# Patient Record
Sex: Male | Born: 1945 | Race: White | Hispanic: No | Marital: Married | Smoking: Former smoker
Health system: Southern US, Community
[De-identification: ages and names within clinical notes are randomized; demographics above are authoritative.]

## PROBLEM LIST (undated history)

## (undated) DIAGNOSIS — Z923 Personal history of irradiation: Secondary | ICD-10-CM

## (undated) DIAGNOSIS — E785 Hyperlipidemia, unspecified: Secondary | ICD-10-CM

## (undated) DIAGNOSIS — M1990 Unspecified osteoarthritis, unspecified site: Secondary | ICD-10-CM

## (undated) DIAGNOSIS — Z8709 Personal history of other diseases of the respiratory system: Secondary | ICD-10-CM

## (undated) DIAGNOSIS — K219 Gastro-esophageal reflux disease without esophagitis: Secondary | ICD-10-CM

## (undated) DIAGNOSIS — I10 Essential (primary) hypertension: Secondary | ICD-10-CM

## (undated) DIAGNOSIS — K635 Polyp of colon: Secondary | ICD-10-CM

## (undated) DIAGNOSIS — N529 Male erectile dysfunction, unspecified: Secondary | ICD-10-CM

## (undated) DIAGNOSIS — I4891 Unspecified atrial fibrillation: Secondary | ICD-10-CM

## (undated) DIAGNOSIS — I499 Cardiac arrhythmia, unspecified: Secondary | ICD-10-CM

## (undated) DIAGNOSIS — Z9889 Other specified postprocedural states: Secondary | ICD-10-CM

## (undated) HISTORY — DX: Male erectile dysfunction, unspecified: N52.9

## (undated) HISTORY — DX: Hyperlipidemia, unspecified: E78.5

## (undated) HISTORY — DX: Unspecified atrial fibrillation: I48.91

## (undated) HISTORY — DX: Other specified postprocedural states: Z98.890

## (undated) HISTORY — PX: OTHER SURGICAL HISTORY: SHX169

## (undated) HISTORY — PX: ELBOW ARTHROSCOPY: SUR87

---

## 1998-04-26 ENCOUNTER — Ambulatory Visit (HOSPITAL_COMMUNITY): Admission: RE | Admit: 1998-04-26 | Discharge: 1998-04-26 | Payer: Self-pay | Admitting: Internal Medicine

## 2002-01-27 ENCOUNTER — Ambulatory Visit (HOSPITAL_COMMUNITY): Admission: RE | Admit: 2002-01-27 | Discharge: 2002-01-27 | Payer: Self-pay | Admitting: Orthopedic Surgery

## 2002-01-27 ENCOUNTER — Encounter: Payer: Self-pay | Admitting: Orthopedic Surgery

## 2002-03-30 ENCOUNTER — Ambulatory Visit (HOSPITAL_BASED_OUTPATIENT_CLINIC_OR_DEPARTMENT_OTHER): Admission: RE | Admit: 2002-03-30 | Discharge: 2002-03-30 | Payer: Self-pay | Admitting: Orthopedic Surgery

## 2002-06-17 ENCOUNTER — Encounter: Payer: Self-pay | Admitting: Orthopaedic Surgery

## 2002-06-17 ENCOUNTER — Ambulatory Visit (HOSPITAL_COMMUNITY): Admission: RE | Admit: 2002-06-17 | Discharge: 2002-06-17 | Payer: Self-pay | Admitting: Orthopaedic Surgery

## 2004-11-26 ENCOUNTER — Ambulatory Visit: Payer: Self-pay | Admitting: Family Medicine

## 2004-12-02 ENCOUNTER — Ambulatory Visit: Payer: Self-pay | Admitting: Family Medicine

## 2004-12-05 ENCOUNTER — Encounter: Admission: RE | Admit: 2004-12-05 | Discharge: 2004-12-05 | Payer: Self-pay | Admitting: Family Medicine

## 2005-01-08 ENCOUNTER — Ambulatory Visit: Payer: Self-pay | Admitting: Family Medicine

## 2005-12-04 ENCOUNTER — Ambulatory Visit: Payer: Self-pay | Admitting: Family Medicine

## 2005-12-11 ENCOUNTER — Ambulatory Visit: Payer: Self-pay | Admitting: Family Medicine

## 2005-12-18 ENCOUNTER — Encounter: Admission: RE | Admit: 2005-12-18 | Discharge: 2005-12-18 | Payer: Self-pay | Admitting: Family Medicine

## 2006-12-24 ENCOUNTER — Ambulatory Visit: Payer: Self-pay | Admitting: Family Medicine

## 2006-12-24 LAB — CONVERTED CEMR LAB
ALT: 35 units/L (ref 0–40)
AST: 34 units/L (ref 0–37)
Albumin: 4.2 g/dL (ref 3.5–5.2)
Bilirubin, Direct: 0.1 mg/dL (ref 0.0–0.3)
CO2: 33 meq/L — ABNORMAL HIGH (ref 19–32)
Creatinine, Ser: 0.9 mg/dL (ref 0.4–1.5)
Eosinophils Relative: 3.2 % (ref 0.0–5.0)
GFR calc Af Amer: 110 mL/min
HDL: 40.7 mg/dL (ref >39.0)
Hemoglobin: 16.3 g/dL (ref 13.0–17.0)
Monocytes Relative: 9.6 % (ref 3.0–11.0)
Neutro Abs: 2.8 10*3/uL (ref 1.4–7.7)
Neutrophils Relative %: 49.7 % (ref 43.0–77.0)
PSA: 0.63 ng/mL (ref 0.10–4.00)
Platelets: 259 10*3/uL (ref 150–400)
Potassium: 4.3 meq/L (ref 3.5–5.1)
RDW: 12.6 % (ref 11.5–14.6)
Total CHOL/HDL Ratio: 4.8
Total Protein: 6.8 g/dL (ref 6.0–8.3)
Triglycerides: 155 mg/dL — ABNORMAL HIGH (ref 0–149)
VLDL: 31 mg/dL (ref 0–40)
WBC: 5.8 10*3/uL (ref 4.5–10.5)

## 2006-12-31 ENCOUNTER — Ambulatory Visit: Payer: Self-pay | Admitting: Family Medicine

## 2007-01-05 ENCOUNTER — Encounter: Admission: RE | Admit: 2007-01-05 | Discharge: 2007-01-05 | Payer: Self-pay | Admitting: Family Medicine

## 2007-04-13 ENCOUNTER — Ambulatory Visit: Payer: Self-pay | Admitting: Family Medicine

## 2007-04-13 DIAGNOSIS — R002 Palpitations: Secondary | ICD-10-CM | POA: Insufficient documentation

## 2007-04-20 ENCOUNTER — Ambulatory Visit: Payer: Self-pay | Admitting: Internal Medicine

## 2007-04-20 ENCOUNTER — Encounter: Payer: Self-pay | Admitting: Family Medicine

## 2007-04-20 DIAGNOSIS — I480 Paroxysmal atrial fibrillation: Secondary | ICD-10-CM | POA: Insufficient documentation

## 2007-04-20 DIAGNOSIS — I4891 Unspecified atrial fibrillation: Secondary | ICD-10-CM

## 2007-04-28 ENCOUNTER — Encounter: Payer: Self-pay | Admitting: Family Medicine

## 2007-04-28 ENCOUNTER — Ambulatory Visit: Payer: Self-pay | Admitting: Family Medicine

## 2007-04-28 ENCOUNTER — Ambulatory Visit: Payer: Self-pay

## 2007-04-28 DIAGNOSIS — T887XXA Unspecified adverse effect of drug or medicament, initial encounter: Secondary | ICD-10-CM | POA: Insufficient documentation

## 2007-04-28 LAB — CONVERTED CEMR LAB: Digitoxin Lvl: 0.5 ng/mL — ABNORMAL LOW (ref 0.8–2.0)

## 2007-05-03 ENCOUNTER — Ambulatory Visit: Payer: Self-pay | Admitting: Family Medicine

## 2007-05-03 ENCOUNTER — Ambulatory Visit: Payer: Self-pay | Admitting: Internal Medicine

## 2007-05-03 LAB — CONVERTED CEMR LAB
Digitoxin Lvl: 0.6 ng/mL — ABNORMAL LOW (ref 0.8–2.0)
INR: 1.4

## 2007-06-23 ENCOUNTER — Ambulatory Visit: Payer: Self-pay | Admitting: Internal Medicine

## 2007-12-14 ENCOUNTER — Ambulatory Visit: Payer: Self-pay | Admitting: Internal Medicine

## 2007-12-28 ENCOUNTER — Ambulatory Visit: Payer: Self-pay | Admitting: Family Medicine

## 2007-12-28 LAB — CONVERTED CEMR LAB
Basophils Relative: 0.4 % (ref 0.0–1.0)
Bilirubin, Direct: 0.1 mg/dL (ref 0.0–0.3)
CO2: 31 meq/L (ref 19–32)
Calcium: 9 mg/dL (ref 8.4–10.5)
Chloride: 104 meq/L (ref 96–112)
Cholesterol: 183 mg/dL (ref 0–200)
GFR calc non Af Amer: 91 mL/min
Glucose, Bld: 101 mg/dL — ABNORMAL HIGH (ref 70–99)
HDL: 38.7 mg/dL — ABNORMAL LOW (ref >39.0)
Lymphocytes Relative: 33.2 % (ref 12.0–46.0)
Monocytes Absolute: 0.5 10*3/uL (ref 0.1–1.0)
Monocytes Relative: 9.3 % (ref 3.0–12.0)
Platelets: 208 10*3/uL (ref 150–400)
RBC: 5.11 M/uL (ref 4.22–5.81)
RDW: 12.4 % (ref 11.5–14.6)
Sodium: 139 meq/L (ref 135–145)
Total CHOL/HDL Ratio: 4.7
Total Protein: 6.5 g/dL (ref 6.0–8.3)
Triglycerides: 103 mg/dL (ref 0–149)
VLDL: 21 mg/dL (ref 0–40)
WBC: 5.5 10*3/uL (ref 4.5–10.5)

## 2008-01-03 ENCOUNTER — Ambulatory Visit: Payer: Self-pay | Admitting: Family Medicine

## 2008-01-03 DIAGNOSIS — E785 Hyperlipidemia, unspecified: Secondary | ICD-10-CM | POA: Insufficient documentation

## 2008-01-03 DIAGNOSIS — F528 Other sexual dysfunction not due to a substance or known physiological condition: Secondary | ICD-10-CM | POA: Insufficient documentation

## 2008-01-03 DIAGNOSIS — J309 Allergic rhinitis, unspecified: Secondary | ICD-10-CM | POA: Insufficient documentation

## 2008-01-04 ENCOUNTER — Encounter: Admission: RE | Admit: 2008-01-04 | Discharge: 2008-01-04 | Payer: Self-pay | Admitting: Family Medicine

## 2008-01-11 ENCOUNTER — Telehealth: Payer: Self-pay | Admitting: Family Medicine

## 2008-04-11 ENCOUNTER — Telehealth: Payer: Self-pay | Admitting: *Deleted

## 2008-07-20 ENCOUNTER — Ambulatory Visit (HOSPITAL_BASED_OUTPATIENT_CLINIC_OR_DEPARTMENT_OTHER): Admission: RE | Admit: 2008-07-20 | Discharge: 2008-07-20 | Payer: Self-pay | Admitting: Orthopedic Surgery

## 2008-12-04 ENCOUNTER — Ambulatory Visit: Payer: Self-pay | Admitting: Internal Medicine

## 2008-12-04 DIAGNOSIS — I10 Essential (primary) hypertension: Secondary | ICD-10-CM | POA: Insufficient documentation

## 2008-12-18 ENCOUNTER — Encounter: Payer: Self-pay | Admitting: Internal Medicine

## 2008-12-26 ENCOUNTER — Ambulatory Visit: Payer: Self-pay | Admitting: Family Medicine

## 2008-12-26 LAB — CONVERTED CEMR LAB
ALT: 28 units/L (ref 0–53)
AST: 28 units/L (ref 0–37)
Albumin: 4 g/dL (ref 3.5–5.2)
BUN: 11 mg/dL (ref 6–23)
Basophils Absolute: 0 10*3/uL (ref 0.0–0.1)
Bilirubin, Direct: 0.2 mg/dL (ref 0.0–0.3)
Blood in Urine, dipstick: NEGATIVE
CO2: 31 meq/L (ref 19–32)
Calcium: 9.3 mg/dL (ref 8.4–10.5)
Cholesterol: 193 mg/dL (ref 0–200)
Eosinophils Absolute: 0.2 10*3/uL (ref 0.0–0.7)
Eosinophils Relative: 3.5 % (ref 0.0–5.0)
Glucose, Urine, Semiquant: NEGATIVE
HCT: 47.5 % (ref 39.0–52.0)
HDL: 45.5 mg/dL (ref >39.00)
Hemoglobin: 16.4 g/dL (ref 13.0–17.0)
MCHC: 34.6 g/dL (ref 30.0–36.0)
Monocytes Absolute: 0.4 10*3/uL (ref 0.1–1.0)
Neutro Abs: 2.7 10*3/uL (ref 1.4–7.7)
Neutrophils Relative %: 50.4 % (ref 43.0–77.0)
Nitrite: NEGATIVE
PSA: 0.96 ng/mL (ref 0.10–4.00)
RBC: 5.26 M/uL (ref 4.22–5.81)
Sodium: 145 meq/L (ref 135–145)
Total Bilirubin: 1.2 mg/dL (ref 0.3–1.2)
Total CHOL/HDL Ratio: 4
Urobilinogen, UA: 0.2
WBC Urine, dipstick: NEGATIVE
pH: 6.5

## 2009-01-04 ENCOUNTER — Ambulatory Visit: Payer: Self-pay | Admitting: Family Medicine

## 2009-01-04 DIAGNOSIS — Z87448 Personal history of other diseases of urinary system: Secondary | ICD-10-CM | POA: Insufficient documentation

## 2009-01-04 LAB — CONVERTED CEMR LAB
Blood in Urine, dipstick: NEGATIVE
Ketones, urine, test strip: NEGATIVE
Nitrite: NEGATIVE
Specific Gravity, Urine: 1.01
Urobilinogen, UA: 0.2
pH: 6.5

## 2009-04-06 ENCOUNTER — Telehealth: Payer: Self-pay | Admitting: *Deleted

## 2009-04-23 ENCOUNTER — Ambulatory Visit: Payer: Self-pay | Admitting: Family Medicine

## 2009-08-13 ENCOUNTER — Ambulatory Visit: Payer: Self-pay | Admitting: Family Medicine

## 2009-12-14 ENCOUNTER — Encounter: Payer: Self-pay | Admitting: Internal Medicine

## 2009-12-19 ENCOUNTER — Ambulatory Visit: Payer: Self-pay | Admitting: Internal Medicine

## 2010-01-11 ENCOUNTER — Ambulatory Visit: Payer: Self-pay | Admitting: Family Medicine

## 2010-01-11 LAB — CONVERTED CEMR LAB
AST: 30 units/L (ref 0–37)
Albumin: 4.2 g/dL (ref 3.5–5.2)
Bilirubin, Direct: 0.2 mg/dL (ref 0.0–0.3)
CO2: 33 meq/L — ABNORMAL HIGH (ref 19–32)
Calcium: 9.3 mg/dL (ref 8.4–10.5)
Cholesterol: 175 mg/dL (ref 0–200)
Eosinophils Absolute: 0.3 10*3/uL (ref 0.0–0.7)
Glucose, Bld: 88 mg/dL (ref 70–99)
Glucose, Urine, Semiquant: NEGATIVE
LDL Cholesterol: 114 mg/dL — ABNORMAL HIGH (ref 0–99)
Lymphocytes Relative: 28.2 % (ref 12.0–46.0)
Lymphs Abs: 2 10*3/uL (ref 0.7–4.0)
MCV: 93.4 fL (ref 78.0–100.0)
Monocytes Absolute: 0.5 10*3/uL (ref 0.1–1.0)
Neutro Abs: 4.3 10*3/uL (ref 1.4–7.7)
PSA: 0.74 ng/mL (ref 0.10–4.00)
Platelets: 219 10*3/uL (ref 150.0–400.0)
RBC: 5.02 M/uL (ref 4.22–5.81)
RDW: 13.4 % (ref 11.5–14.6)
Specific Gravity, Urine: 1.02
TSH: 1.93 microintl units/mL (ref 0.35–5.50)
Total Bilirubin: 1.3 mg/dL — ABNORMAL HIGH (ref 0.3–1.2)
Total CHOL/HDL Ratio: 4
Triglycerides: 64 mg/dL (ref 0.0–149.0)
VLDL: 12.8 mg/dL (ref 0.0–40.0)
WBC: 7 10*3/uL (ref 4.5–10.5)

## 2010-01-18 ENCOUNTER — Ambulatory Visit: Payer: Self-pay | Admitting: Family Medicine

## 2010-03-07 ENCOUNTER — Ambulatory Visit: Payer: Self-pay | Admitting: Family Medicine

## 2010-03-07 DIAGNOSIS — L919 Hypertrophic disorder of the skin, unspecified: Secondary | ICD-10-CM

## 2010-03-07 DIAGNOSIS — L909 Atrophic disorder of skin, unspecified: Secondary | ICD-10-CM | POA: Insufficient documentation

## 2010-04-22 ENCOUNTER — Ambulatory Visit: Payer: Self-pay | Admitting: Family Medicine

## 2010-07-09 ENCOUNTER — Telehealth: Payer: Self-pay | Admitting: Internal Medicine

## 2010-07-15 ENCOUNTER — Telehealth: Payer: Self-pay | Admitting: Internal Medicine

## 2010-07-25 ENCOUNTER — Telehealth: Payer: Self-pay | Admitting: Family Medicine

## 2010-08-20 ENCOUNTER — Telehealth: Payer: Self-pay | Admitting: Family Medicine

## 2010-09-03 NOTE — Miscellaneous (Signed)
Summary: Waiver of Liability for Zostavax  Waiver of Liability for Zostavax   Imported By: Maryln Gottron 08/16/2009 10:40:49  _____________________________________________________________________  External Attachment:    Type:   Image     Comment:   External Document

## 2010-09-03 NOTE — Assessment & Plan Note (Signed)
Summary: cpx/njr   Vital Signs:  Patient profile:   65 year old male Height:      73 inches Weight:      184 pounds Temp:     98.1 degrees F oral Pulse rate:   80 / minute Pulse rhythm:   regular BP sitting:   120 / 80  (left arm) Cuff size:   regular  Vitals Entered By: Kern Reap CMA Duncan Dull) (January 18, 2010 8:32 AM) CC: cpx   CC:  cpx.  History of Present Illness: Garrett Vargas is a 65 year old, married male, nonsmoker retired Transport planner, who comes in today for general physical examination because of underlying hyperlipidemia and history of atrial fibrillation.  Erectile dysfunction.  His hyperlipidemia, treated with Lipitor 20 mg daily lipids are okay with a total of 175, LDL 114, HDL 48, triglycerides 64.  Atrial fibrillation has resolved for now.  He doesn't think he is been in AF  about a year and a half.  He continues to take Lanoxin, .25 mg daily, atenolol 25 mg daily.  Recent follow-up with Dr. Ladona Ridgel.  Normal a month ago.  He takes V  50 mg p.r.n. for ED  tetanus booster 2006.  These include 2010 Pneumovax 2011.  Allergies (verified): No Known Drug Allergies  Past History:  Past medical, surgical, family and social histories (including risk factors) reviewed, and no changes noted (except as noted below).  Past Medical History: Reviewed history from 01/03/2008 and no changes required. Allergic rhinitis Atrial fibrillation Hyperlipidemia erectile dysfunction lateral meniscus surgery, left knee, 78 ankle surgery, left to 69, bone spurs scope left knee,00/ 03  Past Surgical History: Multiple knee, elbow and foot surgeries  neuroma left foot, 2010, Dr. Dellia Nims, podiatrist  Family History: Reviewed history from 01/03/2008 and no changes required. Family History of CAD Male 1st degree relative <60 Family History of Prostate CA 1st degree relative <50 Family History of Stroke M 1st degree relative <50  Social History: Reviewed history from 01/03/2008 and no  changes required. Retired  Transport planner Married Former Smoker Alcohol use-yes Drug use-no Regular exercise-yes  Review of Systems      See HPI  Physical Exam  General:  Well-developed,well-nourished,in no acute distress; alert,appropriate and cooperative throughout examination Head:  Normocephalic and atraumatic without obvious abnormalities. No apparent alopecia or balding. Eyes:  No corneal or conjunctival inflammation noted. EOMI. Perrla. Funduscopic exam benign, without hemorrhages, exudates or papilledema. Vision grossly normal. Ears:  External ear exam shows no significant lesions or deformities.  Otoscopic examination reveals clear canals, tympanic membranes are intact bilaterally without bulging, retraction, inflammation or discharge. Hearing is grossly normal bilaterally. Nose:  External nasal examination shows no deformity or inflammation. Nasal mucosa are pink and moist without lesions or exudates. Mouth:  Oral mucosa and oropharynx without lesions or exudates.  Teeth in good repair. Neck:  No deformities, masses, or tenderness noted. Chest Wall:  No deformities, masses, tenderness or gynecomastia noted. Breasts:  No masses or gynecomastia noted Lungs:  Normal respiratory effort, chest expands symmetrically. Lungs are clear to auscultation, no crackles or wheezes. Heart:  Normal rate and regular rhythm. S1 and S2 normal without gallop, murmur, click, rub or other extra sounds. Abdomen:  Bowel sounds positive,abdomen soft and non-tender without masses, organomegaly or hernias noted. Rectal:  No external abnormalities noted. Normal sphincter tone. No rectal masses or tenderness. Genitalia:  Testes bilaterally descended without nodularity, tenderness or masses. No scrotal masses or lesions. No penis lesions or urethral discharge. Prostate:  Prostate gland firm and smooth, no enlargement, nodularity, tenderness, mass, asymmetry or induration. Msk:  No deformity or scoliosis noted  of thoracic or lumbar spine.   Pulses:  R and L carotid,radial,femoral,dorsalis pedis and posterior tibial pulses are full and equal bilaterally Extremities:  No clubbing, cyanosis, edema, or deformity noted with normal full range of motion of all joints.   Neurologic:  No cranial nerve deficits noted. Station and gait are normal. Plantar reflexes are down-going bilaterally. DTRs are symmetrical throughout. Sensory, motor and coordinative functions appear intact. Skin:  Intact without suspicious lesions or rashes Cervical Nodes:  No lymphadenopathy noted Axillary Nodes:  No palpable lymphadenopathy Inguinal Nodes:  No significant adenopathy Psych:  Cognition and judgment appear intact. Alert and cooperative with normal attention span and concentration. No apparent delusions, illusions, hallucinations   Impression & Recommendations:  Problem # 1:  HEALTH SCREENING (ICD-V70.0) Assessment Unchanged  Orders: Prescription Created Electronically [phone])  Problem # 2:  ERECTILE DYSFUNCTION, MILD (ICD-302.72) Assessment: Improved  His updated medication list for this problem includes:    Viagra 50 Mg Tabs (Sildenafil citrate) ..... Uad  Problem # 3:  HYPERLIPIDEMIA (ICD-272.4) Assessment: Improved  His updated medication list for this problem includes:    Niaspan 1000 Mg Tbcr (Niacin (antihyperlipidemic)) .Marland Kitchen... Take 1 tablet by mouth at bedtime    Lipitor 20 Mg Tabs (Atorvastatin calcium) .Marland Kitchen... 1 tab @ bedtime  Orders: Prescription Created Electronically [phone])  Problem # 4:  ATRIAL FIBRILLATION (ICD-427.31) Assessment: Improved  His updated medication list for this problem includes:    Lanoxin 0.25 Mg Tabs (Digoxin) .Marland Kitchen... Take 1 tablet by mouth every morning    Adult Aspirin Ec Low Strength 81 Mg Tbec (Aspirin) ..... Once daily    Atenolol 25 Mg Tabs (Atenolol) .Marland Kitchen... Take one half  tablet by mouth daily  Orders: Prescription Created Electronically [phone])  Complete Medication  List: 1)  Niaspan 1000 Mg Tbcr (Niacin (antihyperlipidemic)) .... Take 1 tablet by mouth at bedtime 2)  Lanoxin 0.25 Mg Tabs (Digoxin) .... Take 1 tablet by mouth every morning 3)  Centrum Silver Tabs (Multiple vitamins-minerals) .... Once daily 4)  Adult Aspirin Ec Low Strength 81 Mg Tbec (Aspirin) .... Once daily 5)  Viagra 50 Mg Tabs (Sildenafil citrate) .... Uad 6)  Atenolol 25 Mg Tabs (Atenolol) .... Take one half  tablet by mouth daily 7)  Fish Oil Oil (Fish oil) .... Take one tab two times a day 8)  Lipitor 20 Mg Tabs (Atorvastatin calcium) .Marland Kitchen.. 1 tab @ bedtime  Patient Instructions: 1)  Please schedule a follow-up appointment in 1 year. Prescriptions: LIPITOR 20 MG TABS (ATORVASTATIN CALCIUM) 1 tab @ bedtime  #100 x 3   Entered and Authorized by:   Roderick Pee MD   Signed by:   Roderick Pee MD on 01/18/2010   Method used:   Electronically to        [facility]* (retail)       [address]       [address] Shipping/mailing       [place], [place]  [address]       Ph: [phone]       Fax: [phone]   RxID:   [id number] ATENOLOL 25 MG TABS (ATENOLOL) Take one half  tablet by mouth daily  #100 x 3   Entered and Authorized by:   Roderick Pee MD   Signed by:   Roderick Pee MD on 01/18/2010  Method used:   Electronically to        [facility]* (retail)       [address]       [address] Shipping/mailing       [place], [place]  [address]       Ph: [phone]       Fax: [phone]   RxID:   [id number] VIAGRA 50 MG  TABS (SILDENAFIL CITRATE) UAD  #6 x 11   Entered and Authorized by:   Roderick Pee MD   Signed by:   Roderick Pee MD on 01/18/2010   Method used:   Electronically to        [facility]* (retail)       [address]       [address] Shipping/mailing       [place], [place]  [address]       Ph: [phone]       Fax: [phone]   RxID:   [id number] LANOXIN 0.25  MG  TABS (DIGOXIN) Take 1 tablet by mouth every morning  #100 x 3   Entered and Authorized by:   Roderick Pee MD   Signed by:   Roderick Pee MD on 01/18/2010   Method used:   Electronically to        [facility]* (retail)       [address]       [address] Shipping/mailing       [place], [place]  [address]       Ph: [phone]       Fax: [phone]   RxID:   [id number] NIASPAN 1000 MG TBCR (NIACIN (ANTIHYPERLIPIDEMIC)) Take 1 tablet by mouth at bedtime  #100 x 3   Entered and Authorized by:   Roderick Pee MD   Signed by:   Roderick Pee MD on 01/18/2010   Method used:   Electronically to        [facility]* (retail)       [address]       [address] Shipping/mailing       [place], [place]  [address]       Ph: [phone]       Fax: [phone]   RxID:   [id number]    Immunization History:  Influenza Immunization History:    Influenza:  historical (05/04/2009)

## 2010-09-03 NOTE — Assessment & Plan Note (Signed)
Summary: lesion removal/njrq   Vital Signs:  Patient profile:   65 year old male Height:      73 inches Weight:      180 pounds Temp:     98.0 degrees F oral BP sitting:   102 / 70  (left arm) Cuff size:   regular  Vitals Entered By: Kern Reap CMA Duncan Dull) (March 07, 2010 12:07 PM)  Procedure Note Last Tetanus: Historical (01/08/2005)  Skin Tag Removal: Indication: inflamed lesion Consent signed: yes  Procedure # 1: skin tag(s) removed    Region: anterior    Location: neck/axillary areas    # lesions removed: 15    Instrument used: electrocautery    Anesthesia: 1% lidocaine w/epinephrine  CC: skin tag removal   CC:  skin tag removal.  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  Also, one from his right inner thigh.  Allergies: No Known Drug Allergies   Complete Medication List: 1)  Niaspan 1000 Mg Tbcr (Niacin (antihyperlipidemic)) .... Take 1 tablet by mouth at bedtime 2)  Lanoxin 0.25 Mg Tabs (Digoxin) .... Take 1 tablet by mouth every morning 3)  Centrum Silver Tabs (Multiple vitamins-minerals) .... Once daily 4)  Adult Aspirin Ec Low Strength 81 Mg Tbec (Aspirin) .... Once daily 5)  Viagra 50 Mg Tabs (Sildenafil citrate) .... Uad 6)  Atenolol 25 Mg Tabs (Atenolol) .... Take one half  tablet by mouth daily 7)  Fish Oil Oil (Fish oil) .... Take one tab two times a day 8)  Lipitor 20 Mg Tabs (Atorvastatin calcium) .Marland Kitchen.. 1 tab @ bedtime  Other Orders: Removal of Skin Tags up to 15 Lesions (11200)

## 2010-09-03 NOTE — Assessment & Plan Note (Signed)
Summary: tb skin test - rv   Nurse Visit   Allergies: No Known Drug Allergies  Immunizations Administered:  PPD Skin Test:    Vaccine Type: PPD    Site: left forearm    Mfr: Sanofi Pasteur    Dose: 0.1 ml    Route: ID    Given by: Kern Reap CMA (AAMA)    Exp. Date: 06/06/2011    Lot #: [id number]  PPD Results    Date of reading: 04/25/2010    Results: < 5mm    Interpretation: negative  Orders Added: 1)  TB Skin Test [86580] 2)  Admin 1st Vaccine [08657]

## 2010-09-03 NOTE — Assessment & Plan Note (Signed)
Summary: shingles vax//ccm   Nurse Visit   Allergies: No Known Drug Allergies  Immunizations Administered:  Zostavax # 1:    Vaccine Type: Zostavax    Site: left deltoid    Mfr: Merck    Dose: 0.5 ml    Route: [place]    Given by: Kern Reap CMA (AAMA)    Exp. Date: 08/22/2010    Lot #: [id number]    Physician counseled: yes  Orders Added: 1)  Zoster (Shingles) Vaccine Live [90736] 2)  Admin 1st Vaccine [phone]

## 2010-09-03 NOTE — Assessment & Plan Note (Signed)
Summary: F1Y/ GD  Medications Added ATENOLOL 25 MG TABS (ATENOLOL) Take one half  tablet by mouth daily      Allergies Added: NKDA  History of Present Illness: [name] returns today for followup.  He is a middle aged man with HTN and atrial fibrillation.  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  He has been fairly well controlled in the past on digoxin and as needed beta blockers.  He has had no symptomatic atrial fibrillation in the interim.  He does record his heart rate and blood pressure on a regular basis and these have been well controlled.  He denies palpitations.   No c/p or sob.   Current Medications (verified): 1)  Niaspan 1000 Mg Tbcr (Niacin (Antihyperlipidemic)) .... Take 1 Tablet By Mouth At Bedtime 2)  Lanoxin 0.25 Mg  Tabs (Digoxin) .... Take 1 Tablet By Mouth Every Morning 3)  Centrum Silver   Tabs (Multiple Vitamins-Minerals) .... Once Daily 4)  Adult Aspirin Ec Low Strength 81 Mg  Tbec (Aspirin) .... Once Daily 5)  Viagra 50 Mg  Tabs (Sildenafil Citrate) .... Uad 6)  Atenolol 25 Mg Tabs (Atenolol) .... Take One Half  Tablet By Mouth Daily 7)  Fish Oil  Oil (Fish Oil) .... Take One Tab Two Times A Day 8)  Lipitor 20 Mg Tabs (Atorvastatin Calcium) .Marland Kitchen.. 1 Tab @ Bedtime  Allergies (verified): No Known Drug Allergies  Past History:  Past Medical History: Last updated: 01/03/2008 Allergic rhinitis Atrial fibrillation Hyperlipidemia erectile dysfunction lateral meniscus surgery, left knee, 78 ankle surgery, left to 69, bone spurs scope left knee,00/ 03  Past Surgical History: Last updated: 09/01/2008 Multiple knee, elbow and foot surgeries  Review of Systems  The patient denies chest pain, syncope, dyspnea on exertion, and peripheral edema.    Vital Signs:  Patient profile:   65 year old male Height:      73 inches Weight:      184 pounds BMI:     24.36 Pulse rate:   64 / minute BP sitting:   110 / 80  (left  arm)  Vitals Entered By: Laurance Flatten CMA (Dec 19, 2009 10:16 AM)  Physical Exam  General:  Well-developed,well-nourished,in no acute distress; alert,appropriate and cooperative throughout examination Head:  Normocephalic and atraumatic without obvious abnormalities. No apparent alopecia or balding. Mouth:  Oral mucosa and oropharynx without lesions or exudates.  Teeth in good repair. Neck:  No deformities, masses, or tenderness noted. Lungs:  Normal respiratory effort, chest expands symmetrically. Lungs are clear to auscultation, no crackles or wheezes. Heart:  Normal rate and regular rhythm. S1 and S2 normal without gallop, murmur, click, rub or other extra sounds. Abdomen:  Bowel sounds positive,abdomen soft and non-tender without masses, organomegaly or hernias noted. Msk:  No deformity or scoliosis noted of thoracic or lumbar spine.   Pulses:  R and L carotid,radial,femoral,dorsalis pedis and posterior tibial pulses are full and equal bilaterally Extremities:  No clubbing, cyanosis, edema, or deformity noted with normal full range of motion of all joints.   Neurologic:  No cranial nerve deficits noted. Station and gait are normal. Plantar reflexes are down-going bilaterally. DTRs are symmetrical throughout. Sensory, motor and coordinative functions appear intact.   Impression & Recommendations:  Problem # 1:  ATRIAL FIBRILLATION (ICD-427.31) He remains well controlled and in NSR without anti-arrhythmic drugs.  Continue current meds. His updated medication list for this problem includes:    Lanoxin 0.25 Mg Tabs (  Digoxin) .Marland Kitchen... Take 1 tablet by mouth every morning    Adult Aspirin Ec Low Strength 81 Mg Tbec (Aspirin) ..... Once daily    Atenolol 25 Mg Tabs (Atenolol) .Marland Kitchen... Take one half  tablet by mouth daily  Problem # 2:  ESSENTIAL HYPERTENSION, BENIGN (ICD-401.1) His blood pressure has been fairly well controlled.  He will maintain a low sodium diet, exercise regularly, and continue  his current meds. His updated medication list for this problem includes:    Adult Aspirin Ec Low Strength 81 Mg Tbec (Aspirin) ..... Once daily    Atenolol 25 Mg Tabs (Atenolol) .Marland Kitchen... Take one half  tablet by mouth daily  Patient Instructions: 1)  Your physician recommends that you schedule a follow-up appointment in: 12 months with Dr. Ladona Ridgel.

## 2010-09-03 NOTE — Progress Notes (Signed)
Summary: Pt request call   Phone Note Call from Patient Call back at Work Phone [phone]   Caller: Patient Reason for Call: Talk to Nurse Initial call taken by: Judie Grieve,  July 09, 2010 3:07 PM  Follow-up for Phone Call        spoke with Dr Orvan Falconer  will have Dr Ladona Ridgel call him back after reviewing his letter Dennis Bast, RN, BSN  July 09, 2010 3:25 PM

## 2010-09-03 NOTE — Letter (Signed)
Summary: Patients Self-Recorded Med List  Patients Self-Recorded Med List   Imported By: Debby Freiberg 01/25/2010 10:19:45  _____________________________________________________________________  External Attachment:    Type:   Image     Comment:   External Document

## 2010-09-05 NOTE — Progress Notes (Signed)
  Phone Note Call from Patient   Caller: Patient Call For: Roderick Pee MD Summary of Call: Pt will be using Mail order 08/2010, and will be needing all new prescriptions...Marland KitchenMarland KitchenLipitor 20mg . , Atenolol 25 mg., Digoxin 0.25 mg. [phone] Initial call taken by: Lynann Beaver CMA AAMA,  July 25, 2010 3:36 PM  Follow-up for Phone Call        ok Follow-up by: Roderick Pee MD,  July 25, 2010 3:47 PM

## 2010-09-05 NOTE — Progress Notes (Signed)
  Phone Note Refill Request Message from:  Patient on July 25, 2010 4:04 PM  Refills Requested: Medication #1:  LIPITOR 20 MG TABS 1 tab @ bedtime.  Medication #2:  ATENOLOL 25 MG TABS Take one half  tablet by mouth daily  Medication #3:  LANOXIN 0.25 MG  TABS Take 1 tablet by mouth every morning Initial call taken by: Kern Reap CMA Duncan Dull),  July 25, 2010 4:04 PM    Prescriptions: LIPITOR 20 MG TABS (ATORVASTATIN CALCIUM) 1 tab @ bedtime  #100 x 3   Entered by:   Kern Reap CMA (AAMA)   Authorized by:   Roderick Pee MD   Signed by:   Kern Reap CMA (AAMA) on 07/25/2010   Method used:   Print then Give to Patient   RxID:   [id number] ATENOLOL 25 MG TABS (ATENOLOL) Take one half  tablet by mouth daily  #100 x 3   Entered by:   Kern Reap CMA (AAMA)   Authorized by:   Roderick Pee MD   Signed by:   Kern Reap CMA (AAMA) on 07/25/2010   Method used:   Print then Give to Patient   RxID:   [id number] LANOXIN 0.25 MG  TABS (DIGOXIN) Take 1 tablet by mouth every morning  #100 x 3   Entered by:   Kern Reap CMA (AAMA)   Authorized by:   Roderick Pee MD   Signed by:   Kern Reap CMA (AAMA) on 07/25/2010   Method used:   Print then Give to Patient   RxID:   [id number]

## 2010-09-05 NOTE — Progress Notes (Signed)
Summary: refill  Phone Note Call from Patient   Summary of Call: patient needs a refill of niaspan Initial call taken by: Kern Reap CMA Duncan Dull),  August 20, 2010 1:45 PM    Prescriptions: NIASPAN 1000 MG TBCR (NIACIN (ANTIHYPERLIPIDEMIC)) Take 1 tablet by mouth at bedtime  #100 x 3   Entered by:   Kern Reap CMA (AAMA)   Authorized by:   Roderick Pee MD   Signed by:   Kern Reap CMA (AAMA) on 08/20/2010   Method used:   Print then Give to Patient   RxID:   [id number]

## 2010-09-05 NOTE — Progress Notes (Signed)
Summary: calling back   Phone Note Call from Patient Call back at Home Phone [phone] Call back at Work Phone [phone]   Caller: Patient Reason for Call: Talk to Nurse, Talk to Doctor Details for Reason: per pt calling back to speak with dr. Ladona Ridgel. Initial call taken by: Lorne Skeens,  July 15, 2010 3:20 PM  Follow-up for Phone Call        pt would like to speak with dr taylor. pt states he sent a letter to dr Ladona Ridgel. Follow-up by: Roe Coombs,  July 23, 2010 8:32 AM  Additional Follow-up for Phone Call Additional follow up Details #1::        I spoke to patient and instructed him that it would be ok to stop digoxin. Additional Follow-up by: Laren Boom, MD, [facility],  August 05, 2010 9:46 PM

## 2010-12-06 ENCOUNTER — Encounter: Payer: Self-pay | Admitting: Family Medicine

## 2010-12-10 ENCOUNTER — Encounter: Payer: Self-pay | Admitting: Family Medicine

## 2010-12-10 ENCOUNTER — Ambulatory Visit (INDEPENDENT_AMBULATORY_CARE_PROVIDER_SITE_OTHER)
Admission: RE | Admit: 2010-12-10 | Discharge: 2010-12-10 | Disposition: A | Discharge status: Home / Self Care | Payer: Medicare Other | Source: Ambulatory Visit | Attending: Family Medicine | Admitting: Family Medicine

## 2010-12-10 ENCOUNTER — Ambulatory Visit (INDEPENDENT_AMBULATORY_CARE_PROVIDER_SITE_OTHER): Payer: Medicare Other | Admitting: Family Medicine

## 2010-12-10 DIAGNOSIS — E785 Hyperlipidemia, unspecified: Secondary | ICD-10-CM

## 2010-12-10 DIAGNOSIS — I10 Essential (primary) hypertension: Secondary | ICD-10-CM

## 2010-12-10 DIAGNOSIS — F528 Other sexual dysfunction not due to a substance or known physiological condition: Secondary | ICD-10-CM

## 2010-12-10 DIAGNOSIS — Z23 Encounter for immunization: Secondary | ICD-10-CM

## 2010-12-10 DIAGNOSIS — I4891 Unspecified atrial fibrillation: Secondary | ICD-10-CM

## 2010-12-10 DIAGNOSIS — Z87448 Personal history of other diseases of urinary system: Secondary | ICD-10-CM

## 2010-12-10 LAB — HEPATIC FUNCTION PANEL
ALT: 23 U/L (ref 0–53)
Alkaline Phosphatase: 50 U/L (ref 39–117)
Bilirubin, Direct: 0.2 mg/dL (ref 0.0–0.3)
Total Bilirubin: 1.1 mg/dL (ref 0.3–1.2)
Total Protein: 6.6 g/dL (ref 6.0–8.3)

## 2010-12-10 LAB — POCT URINALYSIS DIPSTICK
Bilirubin, UA: NEGATIVE
Blood, UA: NEGATIVE
Urobilinogen, UA: 0.2

## 2010-12-10 LAB — CBC WITH DIFFERENTIAL/PLATELET
Basophils Relative: 0.3 % (ref 0.0–3.0)
Eosinophils Absolute: 0.2 10*3/uL (ref 0.0–0.7)
Eosinophils Relative: 3.6 % (ref 0.0–5.0)
HCT: 47.3 % (ref 39.0–52.0)
Lymphs Abs: 2 10*3/uL (ref 0.7–4.0)
Monocytes Relative: 8.1 % (ref 3.0–12.0)
Neutrophils Relative %: 53.8 % (ref 43.0–77.0)
Platelets: 199 10*3/uL (ref 150.0–400.0)

## 2010-12-10 LAB — BASIC METABOLIC PANEL
BUN: 14 mg/dL (ref 6–23)
CO2: 31 mEq/L (ref 19–32)
Chloride: 103 mEq/L (ref 96–112)
Glucose, Bld: 94 mg/dL (ref 70–99)

## 2010-12-10 LAB — LIPID PANEL
LDL Cholesterol: 104 mg/dL — ABNORMAL HIGH (ref 0–99)
Total CHOL/HDL Ratio: 3
Triglycerides: 55 mg/dL (ref 0.0–149.0)

## 2010-12-10 MED ORDER — ATORVASTATIN CALCIUM 20 MG PO TABS: 20.0000 mg | ORAL_TABLET | Freq: Every day | ORAL | Status: DC

## 2010-12-10 MED ORDER — SILDENAFIL CITRATE 50 MG PO TABS: 50.0000 mg | ORAL_TABLET | ORAL | Status: DC

## 2010-12-10 MED ORDER — ATENOLOL 25 MG PO TABS: 25.0000 mg | ORAL_TABLET | Freq: Every day | ORAL | Status: DC

## 2010-12-10 MED ORDER — NIACIN ER (ANTIHYPERLIPIDEMIC) 1000 MG PO TBCR: 1000.0000 mg | EXTENDED_RELEASE_TABLET | Freq: Every day | ORAL | Status: DC

## 2010-12-10 NOTE — Progress Notes (Signed)
Subjective:    Patient ID: Garrett Vargas, male    DOB: May 21, 1946, 65 y.o.   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  Also, a history of atrial fibrillation that does resolve spontaneously over time.  He takes Tenormin 12.5 mg daily and a baby aspirin.  BP 110/76.  He takes Lipitor 20 nightly for hyperlipidemia, along with Niaspan 1000 mg.  Will check lipid panel today.  He uses fiber 50 mg p.r.n. For ED.  He had a history two years ago.  They said it resolved spontaneously.  He was on Lanoxin, .5 mg daily, however, in consultation with Dr. Ladona Ridgel.  They stopped the Lanoxin.  He continues to be in normal sinus rhythm.  He is due to see Dr. Ladona Ridgel.  This summer.  He gets routine eye care, dental care, colonoscopy 10 years ago, normal, tetanus, 2006, shingles 2011, Pneumovax today.  Is having more trouble with his left knee.  He's been to see an orthopedist will eventually need a total knee replacement, but for now.  He can play golf and do normal activities with minimal discomfort.  As noted is a boat.  He gets routine eye care.  Vision.  Normal, hearing normal, activities of daily living.  He exercises on a regular basis.  Weight 184 pounds.  He does have guns in the house.  However, they are  locked up.  He is an avid Therapist, nutritional.  He does have a living will and health-care power-of-attorney   Review of Systems  Constitutional: Negative.   HENT: Negative.   Eyes: Negative.   Respiratory: Negative.   Cardiovascular: Negative.   Gastrointestinal: Negative.   Genitourinary: Negative.   Musculoskeletal: Negative.   Skin: Negative.   Neurological: Negative.   Hematological: Negative.   Psychiatric/Behavioral: Negative.        Objective:   Physical Exam  Constitutional: He is oriented to person,  place, and time. He appears well-developed and well-nourished.  HENT:  Head: Normocephalic and atraumatic.  Right Ear: External ear normal.  Left Ear: External ear normal.  Nose: Nose normal.  Mouth/Throat: Oropharynx is clear and moist.  Eyes: Conjunctivae and EOM are normal. Pupils are equal, round, and reactive to light.  Neck: Normal range of motion. Neck supple. No JVD present. No tracheal deviation present. No thyromegaly present.  Cardiovascular: Normal rate, regular rhythm, normal heart sounds and intact distal pulses.  Exam reveals no gallop and no friction rub.   No murmur heard. Pulmonary/Chest: Effort normal and breath sounds normal. No stridor. No respiratory distress. He has no wheezes. He has no rales. He exhibits no tenderness.  Abdominal: Soft. Bowel sounds are normal. He exhibits no distension and no mass. There is no tenderness. There is no rebound and no guarding.  Genitourinary: Rectum normal, prostate normal and penis normal. Guaiac negative stool. No penile tenderness.  Musculoskeletal: Normal range of motion. He exhibits no edema and no tenderness.  Lymphadenopathy:    He has no cervical adenopathy.  Neurological: He is alert and oriented to person, place, and time. He has normal reflexes. No cranial nerve deficit. He exhibits normal muscle tone.  Skin: Skin is warm and dry. No rash noted. No erythema. No pallor.  Psychiatric: He has a normal mood and affect. His behavior is normal. Judgment and thought content normal.  Assessment & Plan:  Healthy male.  History of mild hypertension.  Continue Tenormin 12.5 mg daily.  Hyperlipidemia.  Continue Lipitor 20 mg daily and a baby aspirin also Niaspan.  Erectile dysfunction continue Viagra p.r.n.  History of atrial fibrillation asymptomatic.  No atrial fibrillation, x 2 years follow-up with Dr. Ladona Ridgel.  This summer.  Patient requesting chest x-ray.  Will also give him a Pneumovax.

## 2010-12-10 NOTE — Progress Notes (Signed)
Addended by: Kern Reap on: 12/10/2010 10:58 AM   Modules accepted: Orders

## 2010-12-10 NOTE — Patient Instructions (Signed)
Continue your current good health habits.  Follow-up in one year or sooner if any problems

## 2010-12-11 LAB — PSA: PSA: 1 ng/mL (ref 0.10–4.00)

## 2010-12-11 LAB — TESTOSTERONE: Testosterone: 503.02 ng/dL (ref 350.00–890.00)

## 2010-12-17 NOTE — Letter (Signed)
May 03, 2007    Tinnie Gens A. [name], M.D.  [address] [place], [place] [address]   RE:  Garrett Vargas, Garrett Vargas  MRN:  [id number]  /  DOB:  1945/09/29   Dear [name]:   Thank you for referring Garrett Vargas for EP evaluation. 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 He is referred today for additional evaluation. Please  note that my copy of my clinic note will be forthcoming. Overall, his  examination was normal and reviewed his echocardiogram which  demonstrated low normal LV function and normal atrial dimension. There  are no valvular problems noted as well. As noted before, his examination  is normal. His EKG today was also normal.   After discussion of treatment options with the patient, I think  continuing digoxin is a very reasonable approach, though he may  ultimately require calcium blocker with beta blocker or even a sodium-  channel blocker if his symptoms worsen. Because of his paroxysmal nature  of  his atrial fibrillation, but more importantly because his [place] score was  0, I think we could safely not require him to take Coumadin and let him  aspirin therapy alone. I plan to see the patient back in the office in  approximately two months. Once again, thanks for referring Dr. Orvan Falconer  for EP evaluation.    Sincerely,      Doylene Canning. Ladona Ridgel, MD  Electronically Signed    [name]  DD: 05/03/2007  DT: 05/04/2007  Job #: [id number]

## 2010-12-17 NOTE — Assessment & Plan Note (Signed)
[facility]                         ELECTROPHYSIOLOGY OFFICE NOTE   NAME:Garrett Vargas, Garrett Vargas                      MRN:          [id number]  DATE:06/23/2007                            DOB:          Jan 01, 1946    Garrett Vargas returns today for followup.  He is a very pleasant middle-  aged male with paroxysmal atrial fibrillation and dyslipidemia, who was  initially referred several weeks ago for evaluation of palpitations and  documented atrial fibrillation, with a rapid ventricular response.  When  I saw him, he had been placed on digoxin, with some improvement in his  symptoms.  I initially had recommended a period of watchful waiting and  additional followup to see how it is that he has done.  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  It was my impression at the time I saw him back  in September 2008 that aspirin therapy alone in conjunction with digoxin  was most reasonable.  He has continued to check his blood pressure and  has noted a slight increase in his blood pressure over the last several  weeks, with systolics in the 130-140 range.  Despite this, he has had  very minimal episodes of palpitations.  The last episode where his heart  raced was May 17, 2007.  The patient returns today for additional  evaluation, noting overall he feels well.   PHYSICAL EXAMINATION:  GENERAL:  He is a pleasant well-appearing middle-  aged man, in no distress.  VITAL SIGNS:  Blood pressure was 143/84, the pulse was 82 and regular,  the respirations were 18, the weight was 191 pounds.  NECK:  Revealed no jugular venous distention.  LUNGS:  Clear bilaterally to auscultation.  No wheezes, rales, or  rhonchi were present.  There was no increased work of breathing.  CARDIOVASCULAR:  Revealed a regular rate and rhythm, with a normal S1  and S2.  The PMI was not enlarged or laterally displaced.  EXTREMITIES:  Demonstrated no edema.  The pulses were 2+ and symmetric.   EKG demonstrates sinus rhythm.   IMPRESSION:  1. Paroxysmal atrial fibrillation.  2. Borderline hypertension.   DISCUSSION:  Overall, Garrett Vargas is stable, and I have recommended  that he continue on low-dose aspirin therapy.  I have given him a  prescription today for low-dose atenolol, initially 12.5 and then 25 mg  daily, taken in conjunction with his digoxin.  It may well be that we  have to increase his dose further, but I would like to see him back in  several months before making this  decision.  If his palpitations increase in frequency and severity, then  I would like to see him back sooner.     Doylene Canning. Ladona Ridgel, MD  Electronically Signed    [name]  DD: 06/23/2007  DT: 06/24/2007  Job #: [id number]   cc:   Garrett Gens A. [name], MD

## 2010-12-17 NOTE — Assessment & Plan Note (Signed)
[facility]                         ELECTROPHYSIOLOGY OFFICE NOTE   NAME:Garrett Vargas, Koelling                      MRN:          [id number]  DATE:05/03/2007                            DOB:          03-Nov-1945    Dr. Orvan Falconer is referred today by Eugenio Hoes. [name], MD, for evaluation of  atrial fibrillation.  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  He was ready for a treadmill test several days ago and on being hooked  up to the monitor was found to be in atrial fibrillation with a rapid  ventricular response at approximately 150 beats per minute.  Despite  this, he was fairly asymptomatic.  The patient is referred today for  additional evaluation.  He has had no syncope.  He denies chest pain.  He denies shortness of breath.  He has undergone an echocardiogram,  which demonstrates low normal LV function, and his valves were otherwise  normal.  His atrial chambers were normal.   ADDITIONAL PAST MEDICAL HISTORY:  1. Multiple arthritic problems.  2. Prior knee and elbow surgeries.  3. He has a history of dyslipidemia.  He is on a statin and Lipitor.  4. The patient was recently started on Coumadin.   FAMILY HISTORY:  Negative for premature coronary disease.   SOCIAL HISTORY:  The patient is retired.  He denies tobacco or ethanol  abuse.   REVIEW OF SYSTEMS:  Negative except as noted in the HPI.  Otherwise, all  systems were reviewed and found to be negative.   PHYSICAL EXAM:  He is a pleasant, well-appearing middle-aged man in no  acute distress.  The blood pressure today was 102/74, the pulse was 79 and regular, the  respirations were 16, the weight was 188 pounds.  HEENT:  Normocephalic and atraumatic.  Pupils equal and round.  The  oropharynx was moist.  Sclerae were anicteric.  He did wear glasses.  NECK:  No jugular venous distention.  There was no thyromegaly.   The  trachea was midline.  The carotids were 2+ and symmetric.  LUNGS:  Clear bilaterally to auscultation.  No wheezes, rales or rhonchi  were present.  There was no increased work of breathing.  CARDIOVASCULAR:  A regular rate and rhythm, normal S1 and S2.  I did not  appreciate any murmurs, rubs or gallops.  The PMI was not enlarged nor  laterally displaced.  ABDOMEN:  Soft, nontender, nondistended.  There was no organomegaly.  Bowel sounds were present.  There was no rebound or guarding.  EXTREMITIES:  No clubbing, cyanosis, or edema.  The pulses were 2+ and  symmetric.  NEUROLOGIC:  Alert and oriented x3 with cranial nerves intact.  The  strength was 5/5 and symmetric.   The EKG today demonstrates sinus rhythm with normal axis and intervals.  EKG from previously demonstrates atrial fibrillation.   IMPRESSION:  1. Paroxysmal atrial fibrillation (lone atrial fibrillation).  2. History of multiple arthritic problems.  3. Dyslipidemia, on statin therapy.   DISCUSSION:  Overall, Dr. Orvan Falconer  is stable.  I have recommended that  he continue on his digoxin.  If his symptoms are not improved, however,  then he may well need to be switched to a calcium channel blocker or a  beta blocker.  Overall, though, the patient is very minimally  symptomatic.  Because he has a CHADS score of 0, then there is no  indication for Coumadin at this time and I have recommended that he take  aspirin 81 mg daily.  I will also plan to see him back in approximately  2 months.     Doylene Canning. Ladona Ridgel, MD  Electronically Signed    [name]  DD: 05/03/2007  DT: 05/04/2007  Job #: [id number]   cc:   Tinnie Gens A. [name], MD

## 2010-12-17 NOTE — Assessment & Plan Note (Signed)
[facility]                         ELECTROPHYSIOLOGY OFFICE NOTE   Man, Effertz SOVEREIGN RAMIRO                      MRN:          [id number]  DATE:12/14/2007                            DOB:          May 14, 1946    HISTORY:  Mr. Tavano returns today for followup.  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  He returns today for followup.  Today, he brings in his heart rate and blood pressure which he records  very regularly.  He has been well over the last several months.  He  notes that he has palpitations perhaps once a week lasting just a few  minutes that he knows of.  His blood pressure has been fairly well  controlled.  He has started exercising again to help bring down his  blood pressure.   MEDICATIONS:  1. Lipitor 10 a day.  2. Diazepam 1 gm a day.  3. Centrum Silver.  4. Fish oil 1 gm a day.  5. Digoxin 0.25 daily.  6. Aspirin 81 mg daily.   PHYSICAL EXAMINATION:  GENERAL:  He is a pleasant, well-appearing middle-  aged man in no distress.  VITAL SIGNS:  Blood pressure is 137/83, the pulse 74 and regular,  respirations were 18.  Weight was 186 pounds.  NECK:  Revealed no jugular venous distention.  LUNGS:  Clear bilaterally to auscultation.  There are no wheezes, rales  or rhonchi.  CARDIAC:  Revealed a regular rate and rhythm with normal S1-S2.  EXTREMITIES:  Demonstrated no edema.   DIAGNOSTICS:  The EKG demonstrates sinus rhythm with an incomplete right  bundle branch block.   IMPRESSION:  1. Paroxysmal atrial fibrillation.  2. Hypertension well controlled with diet therapy.   DISCUSSION:  Mr. Fiumara is stable today.  His AFib is well controlled.  I have recommended that he have p.r.n. beta blockers available and I  have given a prescription for atenolol 25 a day to be used on as needed  basis if he goes  out of rhythm or his blood pressure is up.   FOLLOW UP:  I plan to see the patient back in the office in 6 months.     Doylene Canning. Ladona Ridgel, MD  Electronically Signed    [name]  DD: 12/14/2007  DT: 12/14/2007  Job #: [id number]   cc:   Tinnie Gens A. [name], MD

## 2010-12-17 NOTE — Op Note (Signed)
NAMEJATORIAN, Garrett Vargas NO.:  [id number]   MEDICAL RECORD NO.:  [id number]          PATIENT TYPE:  AMB   LOCATION:  DSC                          FACILITY:  [facility]   PHYSICIAN:  Loreta Ave, M.D. DATE OF BIRTH:  01-25-1946   DATE OF PROCEDURE:  07/20/2008  DATE OF DISCHARGE:                               OPERATIVE REPORT   PREOPERATIVE DIAGNOSES:  1. Left shoulder impingement, rotator cuff tear.  2. Distal clavicle osteolysis.   POSTOPERATIVE DIAGNOSES:  1. Left shoulder impingement, rotator cuff tear.  2. Distal clavicle osteolysis.  3. Anterior labrum tear.   PROCEDURES:  1. Left shoulder exam under anesthesia.  2. Arthroscopy.  3. Debridement of rotator cuff and labrum.  4. Bursectomy.  5. Acromioplasty.  6. Coracoacromial ligament release.  7. Excision of distal clavicle.  8. Arthroscopic-assisted rotator cuff repair with FiberWire suture x2,      swivel lock anchors x2.   SURGEON:  Loreta Ave, MD   ASSISTANT:  Genene Churn. Barry Dienes, [place], present throughout the entire case  necessary for timely completion of procedure.   ANESTHESIA:  General.   BLOOD LOSS:  Minimal.   SPECIMENS:  None.   CULTURES:  None.   COMPLICATIONS:  None.   DRESSINGS:  Soft compressive with shoulder immobilizer.   PROCEDURE:  The patient was brought to the operating room, placed on the  operating table in supine position.  After adequate anesthesia has been  obtained, shoulder examined.  Full motion and good stability.  Placed in  a beach-chair position on the shoulder positioner, prepped and draped in  the usual sterile fashion.  Three portals; anterior, posterior, and  lateral.  Shoulder entered with blunt obturator.  Arthroscope was  introduced, shoulder distended and inspected.  Complex tearing of  anterior labrum debrided.  Articular cartilage, capsular ligamentous  structure, biceps tendon, biceps anchor intact.  Undersurface of cuff  examined.  Complete  tearing of supraspinatus anterior half with near  complete tearing of posterior.  A lot of anterior tendinous tearing.  Debride back to healthy tissue.  Very mobile and repairable.  Cannula  redirected subacromially.  Type 2 acromion.  Bursa resected and cuff  assessed.  Tuberosity roughened abrading bone.  Acromioplasty to a type  1 acromion, shaver and high-speed bur releasing undersurface of CA  ligament and cautery.  Distal clavicle of grade 4 changes, more  spurring.  Periarticular spurs lateral, a centimeter of clavicle  resected.  Adequacy of decompression, clavicle excision confirmed  viewing from all portals.  Lateral portal opened further for cannula  placement.  Cuff was then captured with two horizontal mattress  FiberWire sutures brought out anteriorly and then laterally.  Firmly  anchored down into the tuberosity and the side bleeding bone with two  swivel lock anchors.  Nice firm watertight closure then repair, the cuff  confirmed.  Full passive motion.  No impingement.  Instruments and fluid  removed.  Portals were all closed with nylon.  Injected with Marcaine.  Sterile compressive dressing applied.  Shoulder immobilizer applied.  Anesthesia reversed.  Brought to recovery room.  Tolerated the surgery  well.  No complications.      Loreta Ave, M.D.  Electronically Signed     DFM/MEDQ  D:  07/20/2008  T:  07/21/2008  Job:  [id number]

## 2010-12-20 NOTE — Op Note (Signed)
NAME:  KELSON, QUEENAN NO.:  [id number]   MEDICAL RECORD NO.:  [id number]                   PATIENT TYPE:  AMB   LOCATION:  DSC                                  FACILITY:  [facility]   PHYSICIAN:  Loreta Ave, M.D.              DATE OF BIRTH:  01-29-1946   DATE OF PROCEDURE:  03/30/2002  DATE OF DISCHARGE:  03/30/2002                                 OPERATIVE REPORT   PREOPERATIVE DIAGNOSES:  1. Left knee anterior cruciate ligament tear with anterolateral rotary     instability.  2. Degenerative arthritis.   POSTOPERATIVE DIAGNOSES:  1. Left knee anterior cruciate ligament with anterolateral rotary     instability.  2. Grade 4 degenerative change, lateral compartment, bone-on-bone.  3. Degenerative tearing, remnants of lateral meniscus.  4. Extensive degenerative tearing, posterior half of medial meniscus.  5. Grade 2 and 3 changes, medial compartment and patellofemoral joint.   DESCRIPTION OF PROCEDURES:  1. Left knee examination under anesthesia, arthroscopy, partial medial and     lateral meniscectomies.  2. Generalized debridement and chondroplasty.  3. Anterior cruciate ligament reconstruction not performed because of extent     of degenerative changes present.   SURGEON:  Loreta Ave, M.D.   ASSISTANT:  Arlys Maaz D. Petrarca, P.A.-C.   ANESTHESIA:  General.   ESTIMATED BLOOD LOSS:  Minimal.   CULTURES:  None.   COMPLICATIONS:  None.   DRESSING:  Soft compressive.   DESCRIPTION OF PROCEDURE:  Patient brought to the operating room and placed  on the operating table in the supine position.  After adequate anesthesia  had been obtained, left knee examined.  Positive Lachman, positive drawer,  positive pivot shift.  Collaterals stable.  Marked grating and crepitus,  lateral compartment.  Tourniquet applied, prepped and draped in the usual  sterile fashion.  Exsanguinated with elevation and Esmarch, tourniquet  inflated to 300  mmHg.  Three portals created, one superolateral, each medial  and lateral parapatellar.  Inflow catheter introduced, knee distended,  arthroscope introduced, knee inspected.  Grade 3 changes of patellofemoral  joint treated with chondroplasty throughout.  Reactive synovitis debrided.  Chondral loose bodies removed.  ACL deficient, which was the stump on the  tibia.  PCL intact.  Lateral compartment grade 4 throughout bone-on-bone, no  cartilage left over almost the entirety of the compartment.  Remnants of  lateral meniscus circumferentially debrided back out to the rim.  Most of  these have been removed from previous open meniscectomy.  Medial compartment  only had grade 2 changes but extensive degenerative tearing, posterior half  of medial meniscus.  Saucerized out and tapered in smoothly, removing the  posterior half.  The entire knee thoroughly assessed from all aspects and  all portals.  The degree of degenerative changes laterally was so severe  that cruciate ligament reconstruction was not indicated.  Instruments and  fluid removed.  Portals  and the knee injected with Marcaine.  Portals closed  with 4-0 nylon.  A sterile compressive dressing applied.  Anesthesia  reversed.  Brought to the recovery room.  Tolerated the surgery well, no  complications.                                               Loreta Ave, M.D.    DFM/MEDQ  D:  03/30/2002  T:  04/03/2002  Job:  [id number]

## 2010-12-24 ENCOUNTER — Encounter: Payer: Self-pay | Admitting: Internal Medicine

## 2010-12-24 ENCOUNTER — Ambulatory Visit (INDEPENDENT_AMBULATORY_CARE_PROVIDER_SITE_OTHER): Payer: Medicare Other | Admitting: Internal Medicine

## 2010-12-24 VITALS — BP 120/80 | HR 81 | Resp 16 | Ht 74.0 in | Wt 185.0 lb

## 2010-12-24 DIAGNOSIS — I4891 Unspecified atrial fibrillation: Secondary | ICD-10-CM

## 2010-12-24 DIAGNOSIS — I10 Essential (primary) hypertension: Secondary | ICD-10-CM

## 2010-12-24 NOTE — Assessment & Plan Note (Signed)
His blood pressure appears to be well-controlled. He will continue his low-dose beta blocker. He will maintain a low-sodium diet.

## 2010-12-24 NOTE — Progress Notes (Signed)
HPI Dr. 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 His symptoms have been very minimal. He also has borderline hypertension which has been well-controlled. Interestingly, his sinus rate is somewhat higher than would be expected particularly for his level of exertion and fitness. He has not had any syncope. He denies chest pain or shortness of breath or peripheral edema. No Known Allergies   Current Outpatient Prescriptions  Medication Sig Dispense Refill  . aspirin 81 MG EC tablet Take 81 mg by mouth daily.        Marland Kitchen atenolol (TENORMIN) 25 MG tablet Take 1 tablet (25 mg total) by mouth daily. Take one half tablet by mouth daily  100 tablet  2  . atorvastatin (LIPITOR) 20 MG tablet Take 1 tablet (20 mg total) by mouth daily. 1 tab@@ bedtime   100 tablet  3  . Fish Oil OIL by Does not apply route.        . Multiple Vitamins-Minerals (CENTRUM SILVER) tablet Take 1 tablet by mouth daily.        . niacin (NIASPAN) 1000 MG CR tablet Take 1 tablet (1,000 mg total) by mouth at bedtime.  100 tablet  3  . sildenafil (VIAGRA) 50 MG tablet Take 1 tablet (50 mg total) by mouth as directed.  10 tablet  11     Past Medical History  Diagnosis Date  . ALLERGIC RHINITIS   . A-fib   . Hyperlipemia   . Erectile dysfunction   . S/P lateral meniscectomy of left knee     ROS:   All systems reviewed and negative except as noted in the HPI.   Past Surgical History  Procedure Date  . Multiple knee,elbow and foot surgeries   . Neuroma left foot,2010, dr. Vida Roller      Family History  Problem Relation Age of Onset  . Coronary artery disease    . Prostate cancer    . Stroke       History   Social History  . Marital Status: Married    Spouse Name: N/A    Number of Children: N/A  . Years of Education: N/A   Occupational History  . retired Transport planner   . former smoker   . alocohol use-yes    . Drug use-no   . regular exercise-yes    Social History Main Topics  . Smoking status: Former Smoker    Quit date: 08/04/1978  . Smokeless tobacco: Not on file  . Alcohol Use: Not on file  . Drug Use: Not on file  . Sexually Active: Not on file   Other Topics Concern  . Not on file   Social History Narrative  . No narrative on file     BP 120/80  Pulse 81  Resp 16  Ht 6\' 2"  (1.88 m)  Wt 185 lb (83.915 kg)  BMI 23.75 kg/m2  Physical Exam:  Well appearing NAD HEENT: Unremarkable Neck:  No JVD, no thyromegally Lymphatics:  No adenopathy Back:  No CVA tenderness Lungs:  Clear. No wheezes, rales, or rhonchi. HEART:  Regular rate rhythm, no murmurs, no rubs, no clicks Abd:  Flat, positive bowel sounds, no organomegally, no rebound, no guarding Ext:  2 plus pulses, no edema, no cyanosis, no clubbing Skin:  No rashes no nodules Neuro:  CN II through XII intact, motor grossly intact  EKG Normal sinus rhythm, 82 beats per minute  Assess/Plan:

## 2010-12-24 NOTE — Patient Instructions (Signed)
Your physician recommends that you schedule a follow-up appointment as needed  

## 2010-12-24 NOTE — Assessment & Plan Note (Signed)
He has had no symptomatic atrial ablation. He will continue his current medications and maintain a period of watchful waiting.

## 2010-12-26 NOTE — Progress Notes (Signed)
Addended by: Marrion Coy on: 12/26/2010 10:24 AM   Modules accepted: Orders

## 2011-04-15 ENCOUNTER — Telehealth: Payer: Self-pay | Admitting: Family Medicine

## 2011-04-15 NOTE — Telephone Encounter (Signed)
Opened in error

## 2011-04-16 ENCOUNTER — Ambulatory Visit (INDEPENDENT_AMBULATORY_CARE_PROVIDER_SITE_OTHER): Payer: Medicare Other | Admitting: Family Medicine

## 2011-04-16 DIAGNOSIS — Z111 Encounter for screening for respiratory tuberculosis: Secondary | ICD-10-CM

## 2011-04-16 DIAGNOSIS — Z0000 Encounter for general adult medical examination without abnormal findings: Secondary | ICD-10-CM

## 2011-04-18 LAB — TB SKIN TEST
Induration: 0
TB Skin Test: NEGATIVE mm

## 2011-05-09 LAB — BASIC METABOLIC PANEL
CO2: 30 mEq/L (ref 19–32)
Chloride: 103 mEq/L (ref 96–112)
Creatinine, Ser: 0.79 mg/dL (ref 0.4–1.5)
Glucose, Bld: 91 mg/dL (ref 70–99)

## 2011-05-27 ENCOUNTER — Encounter (HOSPITAL_COMMUNITY): Payer: Medicare Other

## 2011-05-27 ENCOUNTER — Other Ambulatory Visit: Payer: Self-pay | Admitting: Orthopedic Surgery

## 2011-05-27 LAB — BASIC METABOLIC PANEL
CO2: 30 mEq/L (ref 19–32)
GFR calc non Af Amer: 87 mL/min — ABNORMAL LOW (ref >90)
Glucose, Bld: 129 mg/dL — ABNORMAL HIGH (ref 70–99)
Potassium: 3.2 mEq/L — ABNORMAL LOW (ref 3.5–5.1)
Sodium: 140 mEq/L (ref 135–145)

## 2011-05-27 LAB — URINALYSIS, ROUTINE W REFLEX MICROSCOPIC
Glucose, UA: 250 mg/dL — AB
Hgb urine dipstick: NEGATIVE
Protein, ur: NEGATIVE mg/dL

## 2011-05-27 LAB — DIFFERENTIAL
Lymphocytes Relative: 25 % (ref 12–46)
Monocytes Absolute: 0.7 10*3/uL (ref 0.1–1.0)
Monocytes Relative: 8 % (ref 3–12)
Neutro Abs: 5.6 10*3/uL (ref 1.7–7.7)

## 2011-05-27 LAB — SURGICAL PCR SCREEN
MRSA, PCR: NEGATIVE
Staphylococcus aureus: NEGATIVE

## 2011-05-27 LAB — CBC
HCT: 46.3 % (ref 39.0–52.0)
Hemoglobin: 15.7 g/dL (ref 13.0–17.0)
MCH: 31.7 pg (ref 26.0–34.0)
MCHC: 33.9 g/dL (ref 30.0–36.0)

## 2011-05-27 LAB — PROTIME-INR: Prothrombin Time: 13.4 seconds (ref 11.6–15.2)

## 2011-06-02 ENCOUNTER — Inpatient Hospital Stay (HOSPITAL_COMMUNITY)
Admission: RE | Admit: 2011-06-02 | Discharge: 2011-06-04 | DRG: 470 | Disposition: A | Discharge status: Home Health | Payer: Medicare Other | Source: Ambulatory Visit | Attending: Orthopedic Surgery | Admitting: Orthopedic Surgery

## 2011-06-02 DIAGNOSIS — I4891 Unspecified atrial fibrillation: Secondary | ICD-10-CM | POA: Diagnosis present

## 2011-06-02 DIAGNOSIS — I10 Essential (primary) hypertension: Secondary | ICD-10-CM | POA: Diagnosis present

## 2011-06-02 DIAGNOSIS — IMO0002 Reserved for concepts with insufficient information to code with codable children: Secondary | ICD-10-CM

## 2011-06-02 DIAGNOSIS — Z01812 Encounter for preprocedural laboratory examination: Secondary | ICD-10-CM

## 2011-06-02 DIAGNOSIS — M1710 Unilateral primary osteoarthritis, unspecified knee: Principal | ICD-10-CM | POA: Diagnosis present

## 2011-06-02 DIAGNOSIS — E7800 Pure hypercholesterolemia, unspecified: Secondary | ICD-10-CM | POA: Diagnosis present

## 2011-06-02 DIAGNOSIS — Z79899 Other long term (current) drug therapy: Secondary | ICD-10-CM

## 2011-06-02 DIAGNOSIS — Y9239 Other specified sports and athletic area as the place of occurrence of the external cause: Secondary | ICD-10-CM

## 2011-06-02 DIAGNOSIS — Z7982 Long term (current) use of aspirin: Secondary | ICD-10-CM

## 2011-06-02 LAB — ABO/RH: ABO/RH(D): O POS

## 2011-06-02 LAB — TYPE AND SCREEN: ABO/RH(D): O POS

## 2011-06-03 LAB — BASIC METABOLIC PANEL
BUN: 8 mg/dL (ref 6–23)
Calcium: 8.2 mg/dL — ABNORMAL LOW (ref 8.4–10.5)
Creatinine, Ser: 0.69 mg/dL (ref 0.50–1.35)
GFR calc Af Amer: >90 mL/min (ref >90)
GFR calc non Af Amer: >90 mL/min (ref >90)
Potassium: 3.9 mEq/L (ref 3.5–5.1)

## 2011-06-03 LAB — CBC
HCT: 35.8 % — ABNORMAL LOW (ref 39.0–52.0)
MCH: 30.5 pg (ref 26.0–34.0)
MCHC: 33 g/dL (ref 30.0–36.0)
RDW: 13.1 % (ref 11.5–15.5)

## 2011-06-04 LAB — BASIC METABOLIC PANEL
Calcium: 8.9 mg/dL (ref 8.4–10.5)
GFR calc Af Amer: >90 mL/min (ref >90)
GFR calc non Af Amer: >90 mL/min (ref >90)
Potassium: 3.8 mEq/L (ref 3.5–5.1)
Sodium: 132 mEq/L — ABNORMAL LOW (ref 135–145)

## 2011-06-04 LAB — CBC
MCH: 31.3 pg (ref 26.0–34.0)
MCHC: 33.9 g/dL (ref 30.0–36.0)
Platelets: 185 10*3/uL (ref 150–400)
RDW: 13.2 % (ref 11.5–15.5)

## 2011-06-06 NOTE — Op Note (Signed)
[name], Vargas NO.:  [id number]  MEDICAL RECORD NO.:  [id number]  LOCATION:  [id number]                         FACILITY:  [facility]  PHYSICIAN:  Ollen Gross, M.D.    DATE OF BIRTH:  09-09-1945  DATE OF PROCEDURE:  06/02/2011 DATE OF DISCHARGE:                              OPERATIVE REPORT   PREOPERATIVE DIAGNOSIS:  Osteoarthritis, left knee.  POSTOPERATIVE DIAGNOSIS:  Osteoarthritis, left knee.  PROCEDURE:  Left total knee arthroplasty.  SURGEON:  Ollen Gross, M.D.  ASSISTANT:  Garrett Vargas, P.A.C.  ANESTHESIA:  Spinal.  ESTIMATED BLOOD LOSS:  Minimal.  DRAINS:  Hemovac x1.  TOURNIQUET TIME:  39 minutes at 300 mmHg.  COMPLICATIONS:  None.  CONDITION:  Stable to recovery.  BRIEF CLINICAL NOTE:  Garrett Vargas is a 65 year old male with severe advanced end-stage arthritis of the left knee with progressively worsening pain and dysfunction.  He has bone-on-bone changes tricompartmental in nature.  He has had long-term nonop management including injections, analgesics, and regular exercises.  Despite this, he has had increasing pain and dysfunction.  He has pain at rest.  This was pain with activity.  He has developed limited movement of the knee due to the arthritis.  He even has difficulties with ADL sometimes.  He presents now for left total knee arthroplasty.  PROCEDURE IN DETAIL:  After successful administration of spinal anesthetic, a tourniquet was placed high on his left thigh, and his left lower extremity was prepped and draped in the usual sterile fashion. Extremity was wrapped in Esmarch, knee flexed, tourniquet inflated to 300 mmHg.  Midline incision was made with a 10 blade through subcutaneous tissue to the level of the extensor mechanism.  A fresh blade was used to make a medial parapatellar arthrotomy.  Soft tissue over the proximal medial tibia subperiosteally elevated to the joint line with the knife and into the  semimembranosus bursa with a Cobb elevator.  Soft tissue laterally was elevated with attention being paid to avoid the patellar tendon on tibial tubercle.  The patella was everted, knee flexed 90 degrees, and ACL and PCL removed.  Drill was used create a starting hole in the distal femur and the canal was thoroughly irrigated.  The 5 degree left valgus alignment guide was placed.  The distal femoral cutting block was pinned to remove 11 mm off the distal femur.  Resection was made with an oscillating saw.  The tibia was subluxed forward and menisci removed.  Extramedullary tibial alignment guide was placed referencing proximally at the medial aspect of the tibial tubercle and distally along the second metatarsal axis and tibial crest.  Block was pinned to remove 2 mm off the more deficient lateral side.  Tibial resection was made with an oscillating saw.  Size 5 was most appropriate tibial component.  The proximal tibia was prepared to modular drill and keel punch for the size 5.  Femoral sizing guide was placed, and size 5 was most appropriate on the femur.  Rotation was marked at the epicondylar axis and confirmed by creating rectangular flexion gap at 90 degrees.  The block was pinned in that position and the anterior, posterior, and chamfer cuts made.  Intercondylar block was placed and that cut was made.  Trial size 5 posterior stabilized femur was placed.  A 12.5 mm posterior stabilized rotating platform insert trial was placed.  With the 12.5, full extension was achieved with excellent varus-valgus and anterior- posterior balance, throughout full range of motion.  The patella was everted, thickness measured to be 28 mm.  Freehand resection was taken to 16 mm, 41 template was placed, lug holes were drilled, trial patella was placed and it tracks normally.  Osteophytes were removed off the posterior femur with the trial in place.  All trials were removed and the cut bone surfaces  were prepared with pulsatile lavage.  Cement was mixed and once ready for implantation, the size 5 mobile bearing tibial tray, size 5 posterior stabilized femur, and 41 patella were cemented into place.  The patella was held with a clamp.  Trial 12.5 insert was placed, knee held in full extension, all extruded cement removed.  Once cement fully hardened, then the permanent 12.5 mm posterior stabilized rotating platform insert was placed in tibial tray.  Wound was copiously irrigated with saline solution and the arthrotomy closed over Hemovac drain with interrupted #1 PDS.  Flexion against gravity was about 135 degrees and patella tracks normally.  Tourniquet was then released for a total time of 39 minutes.  The flexion against gravity was 135 degrees. Patella tracks normally.  Subcu was closed with interrupted 2-0 Vicryl and subcuticular running 4-0 Monocryl.  Catheter for Marcaine pain pump was placed and pumps initiated.  Incision was cleaned and dried and Steri-Strips and a bulky sterile dressing were applied.  He was then placed into a knee immobilizer, awakened, and transported to recovery in stable condition.      Please note that a surgical assistant was a medical necessity for the safe and expeditious performance of this procedure. Assistant was necessary for retraction of the ligaments and vital neurovascular structures and for proper positioning of the  leg to allow for anatomic placement of the prosthesis.    Ollen Gross, M.D.     FA/MEDQ  D:  06/02/2011  T:  06/02/2011  Job:  [id number]  Electronically Signed by Ollen Gross M.D. on 06/06/2011 09:48:33 AM

## 2011-06-06 NOTE — H&P (Signed)
Garrett Vargas, COLLEGE NO.:  [id number]  MEDICAL RECORD NO.:  [id number]  LOCATION:                                 FACILITY:  PHYSICIAN:  Garrett Vargas, M.D.    DATE OF BIRTH:  1946/07/05  DATE OF ADMISSION:  06/02/2011 DATE OF DISCHARGE:                             HISTORY & PHYSICAL   CHIEF COMPLAINT:  Left knee pain.  HISTORY OF PRESENT ILLNESS:  The patient is a 65 year old male who has been seen by Dr. Lequita Halt and followed for ongoing left knee pain.  He is a retired Transport planner.  He has had problems with his knees for many years now.  He had original injury dating back in high school playing ball.  He tore his ACL at that time, and did not have surgery.  He later had lateral meniscectomy in the 70s.  He has also had 2 knee arthroscopies since then.  Over the past several years, his knee has continued to get worse.  He has had injections including viscous supplementation, but despite conservative measures including injections, he continues to have pain.  He has been seen in the office where x-rays of both knees show the left knee only has bone-on-bone changes of the patella.  Femoral and the lateral compartments with significant narrowing medially.  He is at a point now where he already has end-stage arthritis and failed conservative measures.  It is felt he would benefit from undergoing surgical intervention.  Risks and benefits have been discussed.  He elected to proceed with surgery.  ALLERGIES:  No known drug allergies.  CURRENT MEDICATIONS:  Lipitor, atenolol, Niaspan, omega-3 fish oil, Centrum Silver and baby aspirin.  PAST MEDICAL HISTORY: 1. Hypertension. 2. Hypercholesterolemia. 3. History of paroxysmal atrial fibrillation. 4. Childhood measles.  PAST SURGICAL HISTORY:  He has had 3 surgeries on the left knee, left elbow surgery in the 90s x2, and also right elbow surgery.  FAMILY HISTORY:  Father deceased at age 51 of prostate  disease.  Mother deceased at age 30 of heart disease.  SOCIAL HISTORY:  Married, retired Transport planner.  Past smoker, 1-2 drinks of alcohol a day.  His wife will be assisting the care after surgery. He does have a living will healthcare power of attorney.  REVIEW OF SYSTEMS:  GENERAL:  No fevers, chills or night sweats. NEUROLOGIC:  No seizure, syncope or paralysis.  RESPIRATORY:  No shortness of breath, productive cough or hemoptysis.  CARDIOVASCULAR: No chest pain.  No orthopnea.  GI:  No nausea, vomiting, diarrhea or constipation.  GU:  No dysuria, hematuria or discharge. MUSCULOSKELETAL:  Knee pain.  PHYSICAL EXAMINATION:  VITAL SIGNS:  Pulse 78, respirations 12, blood pressure 112/82. GENERAL:  This is a 65 year old white male, well nourished, well developed, tall frame, alert, oriented, cooperative, excellent historian. HEENT:  Normocephalic and atraumatic.  Pupils are round and reactive. EOMs intact. NECK:  Supple.  No bruits. CHEST:  Clear. HEART:  Regular rate and rhythm without murmur.  S1 and S2 noted. ABDOMEN:  Soft, nontender.  Bowel sounds present. RECTAL:  Not done, not pertinent to present illness. BREASTS:  Not done, not pertinent to present illness.  GENITALIA:  Not done, not pertinent to present illness. EXTREMITIES:  Left knee, no effusion.  Range of motion 5-120.  Marked crepitus.  Tender lateral than medially.  IMPRESSION:  Osteoarthritis, left knee.  PLAN:  The patient was admitted to [facility] to undergo left total knee replacement arthroplasty.  Surgery will be performed by Dr. Ollen Vargas.     Alexzandrew L. Julien Girt, P.A.C.   ______________________________ Garrett Vargas, M.D.    ALP/MEDQ  D:  06/01/2011  T:  06/02/2011  Job:  [id number]  cc:   Tinnie Gens A. [name], MD [address] [place] [address]  Doylene Canning. Ladona Ridgel, MD [address] [place] [address]

## 2011-06-17 NOTE — Discharge Summary (Signed)
NAMECHESKY, HEYER NO.:  [id number]  MEDICAL RECORD NO.:  [id number]  LOCATION:  [id number]                         FACILITY:  [facility]  PHYSICIAN:  Ollen Gross, M.D.    DATE OF BIRTH:  Nov 19, 1945  DATE OF ADMISSION:  06/02/2011 DATE OF DISCHARGE:  06/04/2011                              DISCHARGE SUMMARY   ADMITTING DIAGNOSES: 1. Osteoarthritis, left knee. 2. Hypertension. 3. Hypercholesterolemia. 4. History of paroxysmal atrial fibrillation. 5. Childhood measles.  DISCHARGE DIAGNOSIS: 1. Osteoarthritis, left knee status post left total knee replacement     arthroplasty. 2. Postoperative hyponatremia. 3. Hypertension. 4. Hypercholesterolemia. 5. History of paroxysmal atrial fibrillation. 6. Childhood measles.  PROCEDURE:  June 02, 2011, left total knee.  SURGEON:  Ollen Gross, M.D.  ASSISTANT:  Alexzandrew L. Perkins, P.A.C.  ANESTHESIA:  Spinal.  TOURNIQUET TIME:  39 minutes.  CONSULTS:  None.  BRIEF HISTORY:  The patient is a 65 year old male with severe end-stage arthritis of the left knee, progressive worsening pain, dysfunction, failed nonoperative management.  He has bone-on-bone, but limited motion and increasing dysfunction, felt to be a good candidate, subsequently admitted to the hospital.  LABORATORY DATA:  Admission CBC not scanned in the chart, but the postoperative hemoglobin was down to 11.8, back up to 12 with hematocrit of 35.4.  Chemistry panel on admission not scanned in the chart.  Sodium was 133 on postop day #1, dropped down to 132 on postop day #2. Remaining electrolytes remained within normal limits.  Blood group type O positive.  HOSPITAL COURSE:  The patient admitted to [facility], taken to OR, underwent above-stated procedure without complication.  The patient tolerated the procedure well, later transferred to recovery room from orthopedic floor, started on p.o. and IV analgesic pain control following  surgery, given 24 hours postoperative IV antibiotics, started on DVT prophylaxis.  Actually, doing very well on the morning of day #1, had excellent urinary output.  Sodium was a little low at 133, hemoglobin looked good.  Started back on home meds, actually got up and walked very well with physical therapy on that afternoon and by day #2, was doing very well.  Pain was under excellent control and the patient wanted to go home, so we got 2 good sessions in the therapy and once he met all of his goals, we let him to go home later that day.  The dressing was changed on day 2.  Also, the incision was healing well.  DISCHARGE PLAN: 1. The patient discharged home on June 04, 2011. 2. Discharge diagnoses:  Please see above. 3. Discharge meds:  OxyIR, Robaxin, Xarelto.  Continue atenolol,     Lipitor, Niaspan. 4. Diet:  Heart healthy diet. 5. Activity:  Weightbearing as tolerated.  Total knee protocol.     Follow up 2 weeks.  DISPOSITION AND CONDITION ON DISCHARGE:  Improving.     Alexzandrew L. Julien Girt, P.A.C.   ______________________________ Ollen Gross, M.D.    ALP/MEDQ  D:  06/17/2011  T:  06/17/2011  Job:  [id number]  cc:   Tinnie Gens A. [name], MD [address] [place] [address]  Doylene Canning. Ladona Ridgel, MD [address] [place] [address]

## 2011-07-14 ENCOUNTER — Other Ambulatory Visit: Payer: Self-pay | Admitting: *Deleted

## 2011-07-14 DIAGNOSIS — E785 Hyperlipidemia, unspecified: Secondary | ICD-10-CM

## 2011-07-14 MED ORDER — ATORVASTATIN CALCIUM 20 MG PO TABS: 20.0000 mg | ORAL_TABLET | Freq: Every day | ORAL | Status: DC

## 2011-08-07 DIAGNOSIS — M1710 Unilateral primary osteoarthritis, unspecified knee: Secondary | ICD-10-CM | POA: Diagnosis not present

## 2011-08-07 DIAGNOSIS — IMO0002 Reserved for concepts with insufficient information to code with codable children: Secondary | ICD-10-CM | POA: Diagnosis not present

## 2011-08-13 DIAGNOSIS — M8448XA Pathological fracture, other site, initial encounter for fracture: Secondary | ICD-10-CM | POA: Diagnosis not present

## 2011-09-10 DIAGNOSIS — M8448XA Pathological fracture, other site, initial encounter for fracture: Secondary | ICD-10-CM | POA: Diagnosis not present

## 2011-10-10 DIAGNOSIS — L821 Other seborrheic keratosis: Secondary | ICD-10-CM | POA: Diagnosis not present

## 2011-10-10 DIAGNOSIS — D239 Other benign neoplasm of skin, unspecified: Secondary | ICD-10-CM | POA: Diagnosis not present

## 2011-12-15 ENCOUNTER — Ambulatory Visit: Payer: Medicare Other | Admitting: Family Medicine

## 2011-12-24 ENCOUNTER — Encounter: Payer: Self-pay | Admitting: Family Medicine

## 2011-12-24 ENCOUNTER — Ambulatory Visit (INDEPENDENT_AMBULATORY_CARE_PROVIDER_SITE_OTHER): Payer: Medicare Other | Admitting: Family Medicine

## 2011-12-24 VITALS — BP 102/72 | Temp 97.8°F | Wt 188.0 lb

## 2011-12-24 DIAGNOSIS — N401 Enlarged prostate with lower urinary tract symptoms: Secondary | ICD-10-CM

## 2011-12-24 DIAGNOSIS — F528 Other sexual dysfunction not due to a substance or known physiological condition: Secondary | ICD-10-CM

## 2011-12-24 DIAGNOSIS — Z0000 Encounter for general adult medical examination without abnormal findings: Secondary | ICD-10-CM

## 2011-12-24 DIAGNOSIS — E785 Hyperlipidemia, unspecified: Secondary | ICD-10-CM

## 2011-12-24 DIAGNOSIS — I4891 Unspecified atrial fibrillation: Secondary | ICD-10-CM

## 2011-12-24 DIAGNOSIS — Z87448 Personal history of other diseases of urinary system: Secondary | ICD-10-CM

## 2011-12-24 DIAGNOSIS — I10 Essential (primary) hypertension: Secondary | ICD-10-CM

## 2011-12-24 DIAGNOSIS — N138 Other obstructive and reflux uropathy: Secondary | ICD-10-CM

## 2011-12-24 LAB — CBC WITH DIFFERENTIAL/PLATELET
Basophils Absolute: 0 10*3/uL (ref 0.0–0.1)
Eosinophils Absolute: 0.3 10*3/uL (ref 0.0–0.7)
HCT: 46.7 % (ref 39.0–52.0)
Hemoglobin: 15.6 g/dL (ref 13.0–17.0)
Lymphocytes Relative: 37.3 % (ref 12.0–46.0)
Lymphs Abs: 2.4 10*3/uL (ref 0.7–4.0)
MCHC: 33.3 g/dL (ref 30.0–36.0)
Monocytes Relative: 8.7 % (ref 3.0–12.0)
Neutro Abs: 3.2 10*3/uL (ref 1.4–7.7)
Platelets: 230 10*3/uL (ref 150.0–400.0)
RDW: 13.7 % (ref 11.5–14.6)

## 2011-12-24 LAB — POCT URINALYSIS DIPSTICK
Bilirubin, UA: NEGATIVE
Blood, UA: NEGATIVE
Leukocytes, UA: NEGATIVE
Nitrite, UA: NEGATIVE
Urobilinogen, UA: 0.2
pH, UA: 7.5

## 2011-12-24 LAB — HEPATIC FUNCTION PANEL
ALT: 26 U/L (ref 0–53)
AST: 29 U/L (ref 0–37)
Albumin: 4.2 g/dL (ref 3.5–5.2)
Alkaline Phosphatase: 61 U/L (ref 39–117)

## 2011-12-24 LAB — BASIC METABOLIC PANEL
BUN: 15 mg/dL (ref 6–23)
Calcium: 9.1 mg/dL (ref 8.4–10.5)
Creatinine, Ser: 0.8 mg/dL (ref 0.4–1.5)
GFR: 97.07 mL/min (ref >60.00)
Glucose, Bld: 92 mg/dL (ref 70–99)

## 2011-12-24 LAB — LIPID PANEL
HDL: 45.8 mg/dL (ref >39.00)
Triglycerides: 56 mg/dL (ref 0.0–149.0)

## 2011-12-24 LAB — PSA: PSA: 1.29 ng/mL (ref 0.10–4.00)

## 2011-12-24 MED ORDER — SILDENAFIL CITRATE 50 MG PO TABS: 50.0000 mg | ORAL_TABLET | ORAL | Status: DC

## 2011-12-24 MED ORDER — ATORVASTATIN CALCIUM 20 MG PO TABS: 20.0000 mg | ORAL_TABLET | Freq: Every day | ORAL | Status: DC

## 2011-12-24 MED ORDER — ATENOLOL 25 MG PO TABS: ORAL_TABLET | ORAL | Status: DC

## 2011-12-24 MED ORDER — NIACIN ER (ANTIHYPERLIPIDEMIC) 1000 MG PO TBCR: 1000.0000 mg | EXTENDED_RELEASE_TABLET | Freq: Every day | ORAL | Status: DC

## 2011-12-24 NOTE — Patient Instructions (Signed)
Continue your current medications  Return in one year sooner if any problems 

## 2011-12-24 NOTE — Progress Notes (Signed)
  Subjective:    Patient ID: Garrett Vargas, male    DOB: Nov 27, 1945, 66 y.o.   MRN: [id number]  HPI Dr. 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 for a Medicare wellness examination  He takes Lipitor 20 mg daily for hyperlipidemia and an aspirin tablet will check lipid panel today  He takes Toprol 0.5 mg of atenolol daily history of mild hypertension and AF currently asymptomatic no arrhythmia  He also takes Niaspan 100 mg daily and fish oil  He uses Viagra 50 mg when necessary for ed  He gets routine eye care, regular dental care, colonoscopy and GI, vaccinations up-to-date, routine skin care followed by dermatology  Cognitive function normally,,,,,,, he exercises on a regular basis plays golf knee surgery left total knee replacement one year ago doing well home health safety reviewed no issues identified ,,,,,,,,, guns locked up he does have a health care power of attorney and living well     Review of Systems  Constitutional: Negative.   HENT: Negative.   Eyes: Negative.   Respiratory: Negative.   Cardiovascular: Negative.   Gastrointestinal: Negative.   Genitourinary: Negative.   Musculoskeletal: Negative.   Skin: Negative.   Neurological: Negative.   Hematological: Negative.   Psychiatric/Behavioral: Negative.        Objective:   Physical Exam  Constitutional: He is oriented to person, place, and time. He appears well-developed and well-nourished.  HENT:  Head: Normocephalic and atraumatic.  Right Ear: External ear normal.  Left Ear: External ear normal.  Nose: Nose normal.  Mouth/Throat: Oropharynx is clear and moist.  Eyes: Conjunctivae and EOM are normal. Pupils are equal, round, and reactive to light.  Neck: Normal range of motion. Neck supple. No JVD present. No tracheal deviation present. No thyromegaly present.  Cardiovascular: Normal rate, regular rhythm, normal heart sounds and intact distal pulses.  Exam  reveals no gallop and no friction rub.   No murmur heard. Pulmonary/Chest: Effort normal and breath sounds normal. No stridor. No respiratory distress. He has no wheezes. He has no rales. He exhibits no tenderness.  Abdominal: Soft. Bowel sounds are normal. He exhibits no distension and no mass. There is no tenderness. There is no rebound and no guarding.  Genitourinary: Rectum normal, prostate normal and penis normal. Guaiac negative stool. No penile tenderness.  Musculoskeletal: Normal range of motion. He exhibits no edema and no tenderness.  Lymphadenopathy:    He has no cervical adenopathy.  Neurological: He is alert and oriented to person, place, and time. He has normal reflexes. No cranial nerve deficit. He exhibits normal muscle tone.  Skin: Skin is warm and dry. No rash noted. No erythema. No pallor.  Psychiatric: He has a normal mood and affect. His behavior is normal. Judgment and thought content normal.          Assessment & Plan:  Healthy male  History of AF and mild hypertension continue Tenormin 12.5 mg daily  History of hyperlipidemia continue Lipitor 20 mg daily aspirin and Niaspan and fish oil  Erectile dysfunction continue Viagra when necessary  Status post left total knee replacement

## 2011-12-31 ENCOUNTER — Telehealth: Payer: Self-pay | Admitting: Family Medicine

## 2011-12-31 NOTE — Telephone Encounter (Signed)
Please call Dr. Orvan Falconer,,,,,,,,,,, labs look okay,,,,,,,,, send a copy and mail

## 2011-12-31 NOTE — Telephone Encounter (Signed)
Left message on machine and copy mailed

## 2011-12-31 NOTE — Telephone Encounter (Signed)
Patient called stating that he would like to have a call back with lab results and also have a copy sent to him. Please assist.

## 2012-04-26 ENCOUNTER — Telehealth: Payer: Self-pay | Admitting: Family Medicine

## 2012-04-26 NOTE — Telephone Encounter (Signed)
Caller: Becky/Patient; Phone: [phone]; Reason for Call: Wife requesting to speak with Fleet Contras regarding their family is going out of the country and has question about shots they would need.  States she spoke with Dr.  [name] who suggested she call and speak with Fleet Contras.  Please call her back.  Thanks

## 2012-04-26 NOTE — Telephone Encounter (Signed)
Spoke with Mrs Tuzzolino

## 2012-04-27 ENCOUNTER — Ambulatory Visit (INDEPENDENT_AMBULATORY_CARE_PROVIDER_SITE_OTHER): Payer: Medicare Other | Admitting: Family Medicine

## 2012-04-27 DIAGNOSIS — Z0000 Encounter for general adult medical examination without abnormal findings: Secondary | ICD-10-CM

## 2012-04-27 DIAGNOSIS — Z23 Encounter for immunization: Secondary | ICD-10-CM

## 2012-04-29 ENCOUNTER — Other Ambulatory Visit: Payer: Self-pay | Admitting: *Deleted

## 2012-04-29 LAB — TB SKIN TEST: TB Skin Test: NEGATIVE

## 2012-04-29 MED ORDER — MEFLOQUINE HCL 250 MG PO TABS: 250.0000 mg | ORAL_TABLET | ORAL | Status: DC

## 2012-04-30 ENCOUNTER — Other Ambulatory Visit: Payer: Self-pay | Admitting: *Deleted

## 2012-04-30 MED ORDER — MEFLOQUINE HCL 250 MG PO TABS: 250.0000 mg | ORAL_TABLET | ORAL | Status: DC

## 2012-07-02 ENCOUNTER — Ambulatory Visit (INDEPENDENT_AMBULATORY_CARE_PROVIDER_SITE_OTHER): Payer: Medicare Other | Admitting: *Deleted

## 2012-07-02 DIAGNOSIS — Z23 Encounter for immunization: Secondary | ICD-10-CM | POA: Diagnosis not present

## 2012-10-05 DIAGNOSIS — M1710 Unilateral primary osteoarthritis, unspecified knee: Secondary | ICD-10-CM | POA: Diagnosis not present

## 2012-10-05 DIAGNOSIS — M1610 Unilateral primary osteoarthritis, unspecified hip: Secondary | ICD-10-CM | POA: Diagnosis not present

## 2012-10-05 DIAGNOSIS — M169 Osteoarthritis of hip, unspecified: Secondary | ICD-10-CM | POA: Diagnosis not present

## 2012-10-05 DIAGNOSIS — IMO0002 Reserved for concepts with insufficient information to code with codable children: Secondary | ICD-10-CM | POA: Diagnosis not present

## 2012-12-16 ENCOUNTER — Telehealth: Payer: Self-pay | Admitting: Family Medicine

## 2012-12-16 DIAGNOSIS — E785 Hyperlipidemia, unspecified: Secondary | ICD-10-CM

## 2012-12-16 MED ORDER — NIACIN ER (ANTIHYPERLIPIDEMIC) 1000 MG PO TBCR: 1000.0000 mg | EXTENDED_RELEASE_TABLET | Freq: Every day | ORAL | Status: DC

## 2012-12-16 MED ORDER — ATORVASTATIN CALCIUM 20 MG PO TABS: 20.0000 mg | ORAL_TABLET | Freq: Every day | ORAL | Status: DC

## 2012-12-16 NOTE — Addendum Note (Signed)
Addended by: Kern Reap B on: 12/16/2012 01:39 PM   Modules accepted: Orders

## 2012-12-16 NOTE — Telephone Encounter (Signed)
PT called to request a refill of the following medications atorvastatin (LIPITOR) 20 MG tablet, and niacin (NIASPAN) 1000 MG CR tablet. He would like [facility]. Please assist. He stated that he would not be running out had his appointment not been pushed back by two month, but he does have a standing appt with Dr. [name].

## 2012-12-28 ENCOUNTER — Encounter: Payer: Medicare Other | Admitting: Family Medicine

## 2013-01-10 ENCOUNTER — Ambulatory Visit (INDEPENDENT_AMBULATORY_CARE_PROVIDER_SITE_OTHER): Payer: Medicare Other | Admitting: *Deleted

## 2013-01-10 DIAGNOSIS — Z23 Encounter for immunization: Secondary | ICD-10-CM | POA: Diagnosis not present

## 2013-02-28 ENCOUNTER — Encounter: Payer: Medicare Other | Admitting: Family Medicine

## 2013-03-02 ENCOUNTER — Encounter: Payer: Self-pay | Admitting: Family Medicine

## 2013-03-02 ENCOUNTER — Ambulatory Visit (INDEPENDENT_AMBULATORY_CARE_PROVIDER_SITE_OTHER): Payer: Medicare Other | Admitting: Family Medicine

## 2013-03-02 VITALS — BP 120/80 | Temp 99.0°F | Ht 73.75 in | Wt 184.0 lb

## 2013-03-02 DIAGNOSIS — R972 Elevated prostate specific antigen [PSA]: Secondary | ICD-10-CM

## 2013-03-02 DIAGNOSIS — E785 Hyperlipidemia, unspecified: Secondary | ICD-10-CM

## 2013-03-02 DIAGNOSIS — I4891 Unspecified atrial fibrillation: Secondary | ICD-10-CM | POA: Diagnosis not present

## 2013-03-02 DIAGNOSIS — F528 Other sexual dysfunction not due to a substance or known physiological condition: Secondary | ICD-10-CM

## 2013-03-02 DIAGNOSIS — R7309 Other abnormal glucose: Secondary | ICD-10-CM | POA: Diagnosis not present

## 2013-03-02 DIAGNOSIS — R739 Hyperglycemia, unspecified: Secondary | ICD-10-CM

## 2013-03-02 DIAGNOSIS — Z125 Encounter for screening for malignant neoplasm of prostate: Secondary | ICD-10-CM

## 2013-03-02 DIAGNOSIS — I10 Essential (primary) hypertension: Secondary | ICD-10-CM | POA: Diagnosis not present

## 2013-03-02 DIAGNOSIS — Z0000 Encounter for general adult medical examination without abnormal findings: Secondary | ICD-10-CM

## 2013-03-02 LAB — LIPID PANEL
LDL Cholesterol: 101 mg/dL — ABNORMAL HIGH (ref 0–99)
Total CHOL/HDL Ratio: 3
VLDL: 14 mg/dL (ref 0.0–40.0)

## 2013-03-02 LAB — BASIC METABOLIC PANEL
Calcium: 9.6 mg/dL (ref 8.4–10.5)
GFR: 98.07 mL/min (ref >60.00)
Glucose, Bld: 90 mg/dL (ref 70–99)
Potassium: 4.4 mEq/L (ref 3.5–5.1)
Sodium: 138 mEq/L (ref 135–145)

## 2013-03-02 LAB — HEMOGLOBIN A1C: Hgb A1c MFr Bld: 5.5 % (ref 4.6–6.5)

## 2013-03-02 NOTE — Patient Instructions (Signed)
-  call us about last colonoscopy date  -We have ordered labs or studies at this visit. It can take up to 1-2 weeks for results and processing. We will contact you with instructions IF your results are abnormal. Normal results will be released to your [facility]. If you have not heard from [place] or can not find your results in [facility] in 2 weeks please contact our office.  -wear broad spectrum sunscreen  -follow up with your doctor at regular visits and as needed

## 2013-03-02 NOTE — Progress Notes (Signed)
Medicare Annual Preventive Care Visit  (initial annual wellness or annual wellness exam)  Concerns today: 67 yo M pt of Dr. Nelida Meuse with several chronic medical problems here for annual exam and med check. Per prior PCP records he is active and stays UTD on health maintenance.  A)HLD: -takes lipitor, niaspan  B)HTN/PAF: -takes toprol and ASA -denies: CP, SOB, DOE, swelling , palpitations  C)ED: -occ use of viagra  1.) Patient-completed health risk assessment  - completed and reviewed, see scanned documentation  2.) Review of Medical History: -PMH, PSH, Family History and current specialty and care providers reviewed and updated and listed below in scanned document   3.) Review of functional ability and level of safety:  Any difficulty hearing? NO  History of falling?  NO  Any trouble with IADLs - using a phone, using transportation, grocery shopping, preparing meals, doing housework, doing laundry, taking medications and managing money?  NO  Advance Directives? YES   See summary of recommendations in Patient Instructions below.  4.) Physical Exam Filed Vitals:   03/02/13 0815  BP: 120/80  Temp: 99 F (37.2 C)   Estimated body mass index is 23.79 kg/(m^2) as calculated from the following:   Height as of this encounter: 6' 1.75" (1.873 m).   Weight as of this encounter: 184 lb (83.462 kg).  Mini Cog: 1. Patient instructed to listen carefully and repeat the following: Apple Watch    Penny  2. Clock drawing test was administered: NORMAL       3. Recall of three words  Scoring:  Number of words recalled? 3  Patient Score: NORMAL  See patient instructions for recommendations.  4)The following written screening schedule of preventive measures were reviewed with assessment and plan made per below, orders and patient instructions:        Alcohol screening:neg     Obesity Screening and counseling: N/A     STI screening: deferred     Tobacco Screening:  neg       Pneumococcal (PPSV23 -one dose after 64, one before if risk factors), influenza yearly and hepatitis B vaccines (if high risk - end stage renal disease, IV drugs, homosexual men, live in home for mentally retarded, hemophilia receiving factors) ASSESSMENT/PLAN: UTD      Prostate cancer screening ASSESSMENT/PLAN: discussed options per current guidelines - he wants to check PSA      Colorectal cancer screening (FOBT yearly or flex sig q4y or colonoscopy q10y or barium enema q4y) ASSESSMENT/PLAN: thinks last colonoscopy about 9 years ago - patient to call us about date of last colonoscopy for referral      Screening for glaucoma(q1y if high risk - diabetes, FH, AA and > 50 or hispanic and > 65) ASSESSMENT/PLAN: sees eye doctor on regular basis and gets glaucoma screening      Cardiovascular screening blood tests (lipids q5y) ASSESSMENT/PLAN: done yearly - will repeat today      Diabetes screening tests ASSESSMENT/PLAN: done yearly, will check today   7.) Summary: -risk factors and conditions per above assessment were discussed and treatment, recommendations and referrals were offered per documentation above and orders and patient instructions.

## 2013-03-03 NOTE — Progress Notes (Signed)
Quick Note:  Called and spoke with pt and pt is aware. ______ 

## 2013-03-14 ENCOUNTER — Telehealth: Payer: Self-pay | Admitting: Family Medicine

## 2013-03-14 DIAGNOSIS — I10 Essential (primary) hypertension: Secondary | ICD-10-CM

## 2013-03-14 DIAGNOSIS — E785 Hyperlipidemia, unspecified: Secondary | ICD-10-CM

## 2013-03-14 DIAGNOSIS — I4891 Unspecified atrial fibrillation: Secondary | ICD-10-CM

## 2013-03-14 MED ORDER — ATORVASTATIN CALCIUM 20 MG PO TABS: 20.0000 mg | ORAL_TABLET | Freq: Every day | ORAL | Status: DC

## 2013-03-14 MED ORDER — NIACIN ER (ANTIHYPERLIPIDEMIC) 1000 MG PO TBCR: 1000.0000 mg | EXTENDED_RELEASE_TABLET | Freq: Every day | ORAL | Status: DC

## 2013-03-14 MED ORDER — ATENOLOL 25 MG PO TABS: ORAL_TABLET | ORAL | Status: DC

## 2013-03-14 NOTE — Telephone Encounter (Signed)
PT is calling to request a 1 years supply of niacin (NIASPAN) 1000 MG CR tablet, atorvastatin (LIPITOR) 20 MG tablet, and atenolol (TENORMIN) 25 MG tablet. He would like these sent to [facility]. Please assist. PT is requesting a call to notify him when these are called in.

## 2013-04-27 ENCOUNTER — Ambulatory Visit (INDEPENDENT_AMBULATORY_CARE_PROVIDER_SITE_OTHER): Payer: Medicare Other | Admitting: *Deleted

## 2013-04-27 DIAGNOSIS — Z0000 Encounter for general adult medical examination without abnormal findings: Secondary | ICD-10-CM

## 2013-04-29 LAB — TB SKIN TEST: TB Skin Test: NEGATIVE

## 2013-06-03 DIAGNOSIS — L820 Inflamed seborrheic keratosis: Secondary | ICD-10-CM | POA: Diagnosis not present

## 2013-06-03 DIAGNOSIS — L821 Other seborrheic keratosis: Secondary | ICD-10-CM | POA: Diagnosis not present

## 2013-06-03 DIAGNOSIS — D2320 Other benign neoplasm of skin of unspecified ear and external auricular canal: Secondary | ICD-10-CM | POA: Diagnosis not present

## 2013-06-03 DIAGNOSIS — B070 Plantar wart: Secondary | ICD-10-CM | POA: Diagnosis not present

## 2013-06-09 ENCOUNTER — Other Ambulatory Visit: Payer: Self-pay

## 2013-06-23 DIAGNOSIS — B079 Viral wart, unspecified: Secondary | ICD-10-CM | POA: Diagnosis not present

## 2013-06-23 DIAGNOSIS — L909 Atrophic disorder of skin, unspecified: Secondary | ICD-10-CM | POA: Diagnosis not present

## 2013-10-07 DIAGNOSIS — H2510 Age-related nuclear cataract, unspecified eye: Secondary | ICD-10-CM | POA: Diagnosis not present

## 2013-10-07 DIAGNOSIS — H1045 Other chronic allergic conjunctivitis: Secondary | ICD-10-CM | POA: Diagnosis not present

## 2013-11-28 DIAGNOSIS — Z09 Encounter for follow-up examination after completed treatment for conditions other than malignant neoplasm: Secondary | ICD-10-CM | POA: Diagnosis not present

## 2013-11-28 DIAGNOSIS — Z86010 Personal history of colonic polyps: Secondary | ICD-10-CM | POA: Diagnosis not present

## 2013-11-28 DIAGNOSIS — D126 Benign neoplasm of colon, unspecified: Secondary | ICD-10-CM | POA: Diagnosis not present

## 2013-11-28 DIAGNOSIS — K5730 Diverticulosis of large intestine without perforation or abscess without bleeding: Secondary | ICD-10-CM | POA: Diagnosis not present

## 2014-01-02 ENCOUNTER — Other Ambulatory Visit: Payer: Self-pay | Admitting: *Deleted

## 2014-01-02 ENCOUNTER — Other Ambulatory Visit: Payer: Self-pay | Admitting: Family Medicine

## 2014-01-02 DIAGNOSIS — F528 Other sexual dysfunction not due to a substance or known physiological condition: Secondary | ICD-10-CM

## 2014-01-02 MED ORDER — SILDENAFIL CITRATE 50 MG PO TABS: 50.0000 mg | ORAL_TABLET | ORAL | Status: DC

## 2014-01-02 MED ORDER — SCOPOLAMINE 1 MG/3DAYS TD PT72: 1.0000 | MEDICATED_PATCH | TRANSDERMAL | Status: DC

## 2014-02-28 ENCOUNTER — Encounter: Payer: Medicare Other | Admitting: Family Medicine

## 2014-03-14 ENCOUNTER — Encounter: Payer: Self-pay | Admitting: Family Medicine

## 2014-03-14 ENCOUNTER — Ambulatory Visit (INDEPENDENT_AMBULATORY_CARE_PROVIDER_SITE_OTHER): Payer: Medicare Other | Admitting: Family Medicine

## 2014-03-14 VITALS — BP 96/70 | Temp 98.1°F | Ht 73.75 in | Wt 182.0 lb

## 2014-03-14 DIAGNOSIS — I4891 Unspecified atrial fibrillation: Secondary | ICD-10-CM

## 2014-03-14 MED ORDER — DILTIAZEM HCL ER 120 MG PO CP12: 120.0000 mg | ORAL_CAPSULE | Freq: Two times a day (BID) | ORAL | Status: DC

## 2014-03-14 NOTE — Patient Instructions (Signed)
Diltiazem 120 mg..........Marland Kitchen 1 now............. then one every 12 hours  Xeralto............Marland Kitchen 1 daily starting today  I put in a call to cardiology to get you an appointment to see Dr. Lovena Le ASAP

## 2014-03-14 NOTE — Progress Notes (Signed)
   Subjective:    Patient ID: Garrett Vargas, male    DOB: 1946-05-13, 68 y.o.   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 fib and was sent to cardiology. At that time he was asymptomatic studies were normal. He was deemed low risk but Timmothy Sours Lanoxin for 2 years then stopped it. He's done well since that time until this morning he noticed rapid heart rate. Also noticed blood pressure was low he felt lightheaded. No chest pain   Review of Systems    review of systems otherwise negative Objective:   Physical Exam  Well-developed well-nourished male in no acute distress vital signs stable he is afebrile except for BP 96/70 cardiopulmonary exam shows rapid irregular heart rate. EKG shows A. fib      Assessment & Plan:  Recurrent atrial fibrillation............ start diltiazem......Marland Kitchen Xeralto....... cardiac consult with Dr. Lovena Le ASAP

## 2014-03-15 ENCOUNTER — Encounter: Payer: Medicare Other | Admitting: Family Medicine

## 2014-03-16 ENCOUNTER — Telehealth: Payer: Self-pay | Admitting: Family Medicine

## 2014-03-16 ENCOUNTER — Encounter (HOSPITAL_COMMUNITY): Payer: Self-pay | Admitting: Pharmacy Technician

## 2014-03-16 ENCOUNTER — Encounter: Payer: Self-pay | Admitting: Internal Medicine

## 2014-03-16 ENCOUNTER — Telehealth: Payer: Self-pay

## 2014-03-16 ENCOUNTER — Ambulatory Visit (INDEPENDENT_AMBULATORY_CARE_PROVIDER_SITE_OTHER): Payer: Medicare Other | Admitting: Internal Medicine

## 2014-03-16 VITALS — BP 90/62 | HR 72 | Ht 74.0 in | Wt 184.2 lb

## 2014-03-16 DIAGNOSIS — I10 Essential (primary) hypertension: Secondary | ICD-10-CM | POA: Diagnosis not present

## 2014-03-16 DIAGNOSIS — I480 Paroxysmal atrial fibrillation: Secondary | ICD-10-CM

## 2014-03-16 DIAGNOSIS — I4891 Unspecified atrial fibrillation: Secondary | ICD-10-CM

## 2014-03-16 LAB — BASIC METABOLIC PANEL
BUN: 18 mg/dL (ref 6–23)
CO2: 29 meq/L (ref 19–32)
CREATININE: 1 mg/dL (ref 0.4–1.5)
Calcium: 9 mg/dL (ref 8.4–10.5)
Chloride: 101 mEq/L (ref 96–112)
GFR: 79.77 mL/min (ref >60.00)
Glucose, Bld: 80 mg/dL (ref 70–99)
Potassium: 3.9 mEq/L (ref 3.5–5.1)
Sodium: 137 mEq/L (ref 135–145)

## 2014-03-16 MED ORDER — RIVAROXABAN 20 MG PO TABS: 20.0000 mg | ORAL_TABLET | Freq: Every day | ORAL | Status: DC

## 2014-03-16 NOTE — Telephone Encounter (Signed)
noted 

## 2014-03-16 NOTE — Assessment & Plan Note (Signed)
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 I have recommended TEE guided DCCV tomorrow. He will continue his AV nodal blocking drugs and Xarelto. He will have had 4 doses tomorrow. I will see him back in 3-4 weeks after DCCV and will address stopping of his meds at that time. I would expect he stop his xarelto and we wean him off of his av nodal blocking drugs.

## 2014-03-16 NOTE — Telephone Encounter (Signed)
Pt states that dr. Sherren Mocha is going to be calling him today, pt would like to provide him another number to call him on because he does not want to miss the call. Please call [phone].

## 2014-03-16 NOTE — Assessment & Plan Note (Signed)
It is unclear to me that he even has HTN. We will follow his blood pressure after DCCV. If he does in fact have HTN, then he would be on life long anti-coagulation.

## 2014-03-16 NOTE — Progress Notes (Signed)
HPI Dr. Megan Salon returns today for an unscheduled visit for evaluation of atrial fib/flutter. 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 He has been well and off of all meds until developing palpitations of Tuesday. He saw Dr. Sherren Mocha and was placed on Xarelto and cardizem and atenolol. He feels better but is still out of rhythm and is referred back for followup. He has had no chest pain. Minimal dyspnea. He does feel palpitations and new immediately that he was out of rhythm.  No Known Allergies   Current Outpatient Prescriptions  Medication Sig Dispense Refill  . aspirin 81 MG EC tablet Take 81 mg by mouth daily.        Marland Kitchen atenolol (TENORMIN) 25 MG tablet Take one half tablet by mouth daily  100 tablet  3  . atorvastatin (LIPITOR) 20 MG tablet Take 1 tablet (20 mg total) by mouth daily.  100 tablet  3  . diltiazem (CARDIZEM SR) 120 MG 12 hr capsule Take 1 capsule (120 mg total) by mouth 2 (two) times daily.  60 capsule  6  . Multiple Vitamins-Minerals (CENTRUM SILVER) tablet Take 1 tablet by mouth daily.        . niacin (NIASPAN) 1000 MG CR tablet Take 1 tablet (1,000 mg total) by mouth at bedtime.  100 tablet  3  . Omega-3 Fatty Acids (FISH OIL PO) Omega 3 Fish Oil 1000 mg with 300 mg Omega 3 Fatty acids - Take 1 capsule twice daily      . rivaroxaban (XARELTO) 20 MG TABS tablet Take 20 mg by mouth daily with supper.      . sildenafil (VIAGRA) 50 MG tablet Take 1 tablet (50 mg total) by mouth as directed.  10 tablet  11   No current facility-administered medications for this visit.     Past Medical History  Diagnosis Date  . ALLERGIC RHINITIS   . A-fib   . Hyperlipemia   . Erectile dysfunction   . S/P lateral meniscectomy of left knee     ROS:   All systems reviewed and negative except as noted in the HPI.   Past Surgical History  Procedure Laterality Date  . Multiple knee,elbow and foot surgeries    . Neuroma left  foot,2010, dr. Anne Fu       Family History  Problem Relation Age of Onset  . Coronary artery disease    . Prostate cancer    . Stroke       History   Social History  . Marital Status: Married    Spouse Name: N/A    Number of Children: N/A  . 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 alocohol use-yes   . Drug use-no   . regular exercise-yes    Social History Main Topics  . Smoking status: Former Smoker    Quit date: 08/04/1978  . Smokeless tobacco: Not on file  . Alcohol Use: Not on file     Comment: 1-2 drinks a few times per week  . Drug Use: Not on file  . Sexual Activity: Not on file   Other Topics Concern  . Not on file   Social History Narrative  . No narrative on file     BP 90/62  Pulse 72  Ht 6\' 2"  (1.88 m)  Wt 184 lb 3.2 oz (83.553 kg)  BMI  23.64 kg/m2  Physical Exam:  Well appearing middle aged man, NAD HEENT: Unremarkable Neck:  No JVD, no thyromegally Back:  No CVA tenderness Lungs:  Clear with no wheezes HEART:  IRegular rate rhythm, no murmurs, no rubs, no clicks Abd:  soft, positive bowel sounds, no organomegally, no rebound, no guarding Ext:  2 plus pulses, no edema, no cyanosis, no clubbing Skin:  No rashes no nodules Neuro:  CN II through XII intact, motor grossly intact  EKG - atrial flutter (atypical) with a controlled VR   Assess/Plan:

## 2014-03-16 NOTE — Patient Instructions (Signed)
Your physician recommends that you continue on your current medications as directed. Please refer to the Current Medication list given to you today.  Your physician has requested that you have a TEE/Cardioversion. During a TEE, sound waves are used to create images of your heart. It provides your doctor with information about the size and shape of your heart and how well your heart's chambers and valves are working. In this test, a transducer is attached to the end of a flexible tube that is guided down you throat and into your esophagus (the tube leading from your mouth to your stomach) to get a more detailed image of your heart. Once the TEE has determined that a blood clot is not present, the cardioversion begins. Electrical Cardioversion uses a jolt of electricity to your heart either through paddles or wired patches attached to your chest. This is a controlled, usually prescheduled, procedure. This procedure is done at the hospital and you are not awake during the procedure. You usually go home the day of the procedure. Please see the instruction sheet given to you today for more information.

## 2014-03-16 NOTE — Telephone Encounter (Signed)
pt aware of appt today 8/13 @ 10:45 with Dr.Taylor

## 2014-03-17 ENCOUNTER — Ambulatory Visit (HOSPITAL_COMMUNITY): Payer: Medicare Other | Admitting: Certified Registered"

## 2014-03-17 ENCOUNTER — Encounter (HOSPITAL_COMMUNITY): Payer: Self-pay | Admitting: Certified Registered"

## 2014-03-17 ENCOUNTER — Ambulatory Visit (HOSPITAL_COMMUNITY)
Admission: RE | Admit: 2014-03-17 | Discharge: 2014-03-17 | Disposition: A | Discharge status: Home / Self Care | Payer: Medicare Other | Source: Ambulatory Visit | Attending: Cardiovascular Disease | Admitting: Cardiovascular Disease

## 2014-03-17 ENCOUNTER — Encounter (HOSPITAL_COMMUNITY)
Admission: RE | Disposition: A | Discharge status: Home / Self Care | Payer: Self-pay | Source: Ambulatory Visit | Attending: Cardiovascular Disease

## 2014-03-17 ENCOUNTER — Encounter (HOSPITAL_COMMUNITY): Payer: Medicare Other | Admitting: Certified Registered"

## 2014-03-17 DIAGNOSIS — I4891 Unspecified atrial fibrillation: Secondary | ICD-10-CM | POA: Insufficient documentation

## 2014-03-17 DIAGNOSIS — Z7982 Long term (current) use of aspirin: Secondary | ICD-10-CM | POA: Diagnosis not present

## 2014-03-17 DIAGNOSIS — Z79899 Other long term (current) drug therapy: Secondary | ICD-10-CM | POA: Insufficient documentation

## 2014-03-17 DIAGNOSIS — Z87891 Personal history of nicotine dependence: Secondary | ICD-10-CM | POA: Insufficient documentation

## 2014-03-17 DIAGNOSIS — I10 Essential (primary) hypertension: Secondary | ICD-10-CM | POA: Insufficient documentation

## 2014-03-17 DIAGNOSIS — I484 Atypical atrial flutter: Secondary | ICD-10-CM

## 2014-03-17 DIAGNOSIS — I4892 Unspecified atrial flutter: Secondary | ICD-10-CM | POA: Diagnosis not present

## 2014-03-17 DIAGNOSIS — Z7901 Long term (current) use of anticoagulants: Secondary | ICD-10-CM | POA: Diagnosis not present

## 2014-03-17 HISTORY — PX: CARDIOVERSION: SHX1299

## 2014-03-17 HISTORY — PX: TEE WITHOUT CARDIOVERSION: SHX5443

## 2014-03-17 SURGERY — ECHOCARDIOGRAM, TRANSESOPHAGEAL
Anesthesia: Monitor Anesthesia Care

## 2014-03-17 MED ORDER — LACTATED RINGERS IV SOLN
INTRAVENOUS | Status: DC | PRN
  Administered 2014-03-17: 08:00:00 via INTRAVENOUS

## 2014-03-17 MED ORDER — PROPOFOL 10 MG/ML IV BOLUS
INTRAVENOUS | Status: DC | PRN
  Administered 2014-03-17 (×2): 30 mg via INTRAVENOUS

## 2014-03-17 MED ORDER — LIDOCAINE HCL (CARDIAC) 20 MG/ML IV SOLN
INTRAVENOUS | Status: DC | PRN
  Administered 2014-03-17: 40 mg via INTRAVENOUS

## 2014-03-17 MED ORDER — LISINOPRIL 2.5 MG PO TABS: 2.5000 mg | ORAL_TABLET | Freq: Every day | ORAL | Status: DC

## 2014-03-17 MED ORDER — SODIUM CHLORIDE 0.9 % IV SOLN: INTRAVENOUS | Status: DC

## 2014-03-17 MED ORDER — PROPOFOL INFUSION 10 MG/ML OPTIME
INTRAVENOUS | Status: DC | PRN
  Administered 2014-03-17: 120 ug/kg/min via INTRAVENOUS

## 2014-03-17 MED ORDER — BUTAMBEN-TETRACAINE-BENZOCAINE 2-2-14 % EX AERO
INHALATION_SPRAY | CUTANEOUS | Status: DC | PRN
  Administered 2014-03-17: 2 via TOPICAL

## 2014-03-17 NOTE — Anesthesia Preprocedure Evaluation (Signed)
Anesthesia Evaluation  Patient identified by MRN, date of birth, ID band Patient awake    Airway Mallampati: I      Dental  (+) Teeth Intact   Pulmonary former smoker,  breath sounds clear to auscultation        Cardiovascular hypertension, + dysrhythmias Atrial Fibrillation Rhythm:Irregular Rate:Abnormal     Neuro/Psych    GI/Hepatic   Endo/Other    Renal/GU      Musculoskeletal   Abdominal   Peds  Hematology   Anesthesia Other Findings   Reproductive/Obstetrics                           Anesthesia Physical Anesthesia Plan  ASA: II  Anesthesia Plan: MAC   Post-op Pain Management:    Induction: Intravenous  Airway Management Planned: Natural Airway and Nasal Cannula  Additional Equipment:   Intra-op Plan:   Post-operative Plan:   Informed Consent: I have reviewed the patients History and Physical, chart, labs and discussed the procedure including the risks, benefits and alternatives for the proposed anesthesia with the patient or authorized representative who has indicated his/her understanding and acceptance.     Plan Discussed with: CRNA and Surgeon  Anesthesia Plan Comments:         Anesthesia Quick Evaluation

## 2014-03-17 NOTE — Op Note (Signed)
INDICATIONS: atrial flutter, precardioversion  PROCEDURE:   Informed consent was obtained prior to the procedure. The risks, benefits and alternatives for the procedure were discussed and the patient comprehended these risks.  Risks include, but are not limited to, cough, sore throat, vomiting, nausea, somnolence, esophageal and stomach trauma or perforation, bleeding, low blood pressure, aspiration, pneumonia, infection, trauma to the teeth and death.    After a procedural time-out, the oropharynx was anesthetized with 20% benzocaine spray. The patient was given propofol IV by the anesthesiology service for deep sedation (total 190 mg for TEE and CV). The transesophageal probe was inserted in the esophagus and stomach without difficulty and multiple views were obtained.  The patient was kept under observation until the patient left the procedure room.  The patient left the procedure room in stable condition.   Agitated microbubble saline contrast was not administered.  COMPLICATIONS:    There were no immediate complications.  FINDINGS:  No LA thrombus  Depressed LV EF 30%, global hypokinesis  RECOMMENDATIONS:   Proceed with CV. Reevaluate EF several weeks after restoration of normal rhythm  Time Spent Directly with the Patient:  60 minutes   Garrett Vargas 03/17/2014, 9:00 AM

## 2014-03-17 NOTE — Interval H&P Note (Signed)
History and Physical Interval Note:  03/17/2014 8:45 AM  Garrett Vargas  has presented today for surgery, with the diagnosis of a fib  The various methods of treatment have been discussed with the patient and family. After consideration of risks, benefits and other options for treatment, the patient has consented to  Procedure(s): TRANSESOPHAGEAL ECHOCARDIOGRAM (TEE) (N/A) CARDIOVERSION (N/A) as a surgical intervention .  The patient's history has been reviewed, patient examined, no change in status, stable for surgery.  I have reviewed the patient's chart and labs.  Questions were answered to the patient's satisfaction.     [name]

## 2014-03-17 NOTE — Progress Notes (Signed)
Echocardiogram Echocardiogram Transesophageal has been performed.  Joelene Millin 03/17/2014, 10:06 AM

## 2014-03-17 NOTE — H&P (View-Only) (Signed)
HPI Dr. Megan Salon returns today for an unscheduled visit for evaluation of atrial fib/flutter. 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 He has been well and off of all meds until developing palpitations of Tuesday. He saw Dr. Sherren Mocha and was placed on Xarelto and cardizem and atenolol. He feels better but is still out of rhythm and is referred back for followup. He has had no chest pain. Minimal dyspnea. He does feel palpitations and new immediately that he was out of rhythm.  No Known Allergies   Current Outpatient Prescriptions  Medication Sig Dispense Refill  . aspirin 81 MG EC tablet Take 81 mg by mouth daily.        Marland Kitchen atenolol (TENORMIN) 25 MG tablet Take one half tablet by mouth daily  100 tablet  3  . atorvastatin (LIPITOR) 20 MG tablet Take 1 tablet (20 mg total) by mouth daily.  100 tablet  3  . diltiazem (CARDIZEM SR) 120 MG 12 hr capsule Take 1 capsule (120 mg total) by mouth 2 (two) times daily.  60 capsule  6  . Multiple Vitamins-Minerals (CENTRUM SILVER) tablet Take 1 tablet by mouth daily.        . niacin (NIASPAN) 1000 MG CR tablet Take 1 tablet (1,000 mg total) by mouth at bedtime.  100 tablet  3  . Omega-3 Fatty Acids (FISH OIL PO) Omega 3 Fish Oil 1000 mg with 300 mg Omega 3 Fatty acids - Take 1 capsule twice daily      . rivaroxaban (XARELTO) 20 MG TABS tablet Take 20 mg by mouth daily with supper.      . sildenafil (VIAGRA) 50 MG tablet Take 1 tablet (50 mg total) by mouth as directed.  10 tablet  11   No current facility-administered medications for this visit.     Past Medical History  Diagnosis Date  . ALLERGIC RHINITIS   . A-fib   . Hyperlipemia   . Erectile dysfunction   . S/P lateral meniscectomy of left knee     ROS:   All systems reviewed and negative except as noted in the HPI.   Past Surgical History  Procedure Laterality Date  . Multiple knee,elbow and foot surgeries    . Neuroma left  foot,2010, dr. Anne Fu       Family History  Problem Relation Age of Onset  . Coronary artery disease    . Prostate cancer    . Stroke       History   Social History  . Marital Status: Married    Spouse Name: N/A    Number of Children: N/A  . 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 alocohol use-yes   . Drug use-no   . regular exercise-yes    Social History Main Topics  . Smoking status: Former Smoker    Quit date: 08/04/1978  . Smokeless tobacco: Not on file  . Alcohol Use: Not on file     Comment: 1-2 drinks a few times per week  . Drug Use: Not on file  . Sexual Activity: Not on file   Other Topics Concern  . Not on file   Social History Narrative  . No narrative on file     BP 90/62  Pulse 72  Ht 6\' 2"  (1.88 m)  Wt 184 lb 3.2 oz (83.553 kg)  BMI  23.64 kg/m2  Physical Exam:  Well appearing middle aged man, NAD HEENT: Unremarkable Neck:  No JVD, no thyromegally Back:  No CVA tenderness Lungs:  Clear with no wheezes HEART:  IRegular rate rhythm, no murmurs, no rubs, no clicks Abd:  soft, positive bowel sounds, no organomegally, no rebound, no guarding Ext:  2 plus pulses, no edema, no cyanosis, no clubbing Skin:  No rashes no nodules Neuro:  CN II through XII intact, motor grossly intact  EKG - atrial flutter (atypical) with a controlled VR   Assess/Plan:

## 2014-03-17 NOTE — Anesthesia Postprocedure Evaluation (Signed)
  Anesthesia Post-op Note  Patient: Garrett Vargas  Procedure(s) Performed: Procedure(s): TRANSESOPHAGEAL ECHOCARDIOGRAM (TEE) (N/A) CARDIOVERSION (N/A)  Patient Location: Endoscopy Unit  Anesthesia Type:MAC  Level of Consciousness: awake and alert   Airway and Oxygen Therapy: Patient Spontanous Breathing  Post-op Pain: none  Post-op Assessment: Post-op Vital signs reviewed  Post-op Vital Signs: stable  Last Vitals:  Filed Vitals:   03/17/14 0908  BP: 81/52  Pulse: 67  Temp: 36.8 C  Resp: 18    Complications: No apparent anesthesia complications

## 2014-03-17 NOTE — Transfer of Care (Signed)
Immediate Anesthesia Transfer of Care Note  Patient: Garrett Vargas  Procedure(s) Performed: Procedure(s): TRANSESOPHAGEAL ECHOCARDIOGRAM (TEE) (N/A) CARDIOVERSION (N/A)  Patient Location: Endoscopy Unit  Anesthesia Type:MAC  Level of Consciousness: awake  Airway & Oxygen Therapy: Patient Spontanous Breathing and Patient connected to nasal cannula oxygen  Post-op Assessment: Report given to PACU RN, Post -op Vital signs reviewed and stable and Patient moving all extremities  Post vital signs: Reviewed and stable  Complications: No apparent anesthesia complications

## 2014-03-17 NOTE — Op Note (Addendum)
Procedure: Electrical Cardioversion Indications:  Atrial Flutter  Procedure Details:  Consent: Risks of procedure as well as the alternatives and risks of each were explained to the (patient/caregiver).  Consent for procedure obtained.  Time Out: Verified patient identification, verified procedure, site/side was marked, verified correct patient position, special equipment/implants available, medications/allergies/relevent history reviewed, required imaging and test results available.  Performed  Patient placed on cardiac monitor, pulse oximetry, supplemental oxygen as necessary.  Sedation given: propofol 190 mg IV, Dr. Orene Desanctis Pacer pads placed anterior and posterior chest.  Cardioverted 1 time(s).  Cardioverted at 120J. Sync biphasic  Evaluation: Findings: Post procedure EKG shows: NSR Complications: None Patient did tolerate procedure well  Stop diltiazem Low dose ACE inhibitor, as tolerated by BP F/U echo in several weeks  Time Spent Directly with the Patient:  60 minutes   Elgin Carn 03/17/2014, 9:03 AM

## 2014-03-20 ENCOUNTER — Encounter (HOSPITAL_COMMUNITY): Payer: Self-pay | Admitting: Cardiovascular Disease

## 2014-03-26 ENCOUNTER — Other Ambulatory Visit: Payer: Self-pay | Admitting: Family Medicine

## 2014-03-28 ENCOUNTER — Other Ambulatory Visit: Payer: Self-pay | Admitting: Family Medicine

## 2014-03-28 ENCOUNTER — Other Ambulatory Visit: Payer: Self-pay

## 2014-03-30 ENCOUNTER — Ambulatory Visit (INDEPENDENT_AMBULATORY_CARE_PROVIDER_SITE_OTHER): Payer: Medicare Other | Admitting: Internal Medicine

## 2014-03-30 ENCOUNTER — Encounter: Payer: Self-pay | Admitting: Internal Medicine

## 2014-03-30 VITALS — BP 108/66 | HR 59 | Ht 74.0 in | Wt 182.4 lb

## 2014-03-30 DIAGNOSIS — I480 Paroxysmal atrial fibrillation: Secondary | ICD-10-CM

## 2014-03-30 DIAGNOSIS — I4891 Unspecified atrial fibrillation: Secondary | ICD-10-CM | POA: Diagnosis not present

## 2014-03-30 DIAGNOSIS — I484 Atypical atrial flutter: Secondary | ICD-10-CM

## 2014-03-30 DIAGNOSIS — I5022 Chronic systolic (congestive) heart failure: Secondary | ICD-10-CM

## 2014-03-30 DIAGNOSIS — I4892 Unspecified atrial flutter: Secondary | ICD-10-CM

## 2014-03-30 NOTE — Progress Notes (Signed)
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 He is a pleasant 68 yo man with a h/o remote atrial fib, who had been in NSR for years and developed atrial fib/flutter and underwent TEE guided cardioversion. At that time he was found to have LV dysfunction and was placed on an ACE inhibitor in addition to his beta blocker. He feels well. No recurrent symptoms of atrial fib/flutter. No CHF symptoms.  No Known Allergies   Current Outpatient Prescriptions  Medication Sig Dispense Refill  . aspirin 81 MG EC tablet Take 81 mg by mouth daily.        Marland Kitchen atenolol (TENORMIN) 25 MG tablet Take one half tablet by mouth daily      . atorvastatin (LIPITOR) 20 MG tablet Take 1 tablet by mouth  daily  100 tablet  1  . lisinopril (PRINIVIL,ZESTRIL) 2.5 MG tablet Take 1 tablet (2.5 mg total) by mouth daily.  90 tablet  3  . Multiple Vitamins-Minerals (CENTRUM SILVER) tablet Take 1 tablet by mouth daily.        . niacin (NIASPAN) 1000 MG CR tablet Take 1 tablet (1,000 mg  total) by mouth at bedtime.  100 tablet  1  . omega-3 acid ethyl esters (LOVAZA) 1 G capsule Take 1 g by mouth 2 (two) times daily.      . rivaroxaban (XARELTO) 20 MG TABS tablet Take 1 tablet (20 mg total) by mouth daily with supper.  30 tablet  5  . sildenafil (VIAGRA) 50 MG tablet Take 1 tablet (50 mg total) by mouth as directed.  10 tablet  11   No current facility-administered medications for this visit.     Past Medical History  Diagnosis Date  . ALLERGIC RHINITIS   . A-fib   . Hyperlipemia   . Erectile dysfunction   . S/P lateral meniscectomy of left knee     ROS:   All systems reviewed and negative except as noted in the HPI.   Past Surgical History  Procedure Laterality Date  . Multiple knee,elbow and foot surgeries    . Neuroma left foot,2010, dr. Rinaldo Cloud without cardioversion N/A 03/17/2014    Procedure: TRANSESOPHAGEAL ECHOCARDIOGRAM (TEE);  Surgeon: Sanda Klein, MD;  Location: [facility];  Service: Cardiovascular;  Laterality: N/A;  . Cardioversion N/A 03/17/2014    Procedure: CARDIOVERSION;  Surgeon: Sanda Klein, MD;  Location: [facility];  Service: Cardiovascular;  Laterality: N/A;     Family History  Problem Relation Age of Onset  . Coronary artery disease    . Prostate cancer    . Stroke       History   Social History  . Marital Status: Married    Spouse Name: N/A    Number of Children: N/A  . 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 alocohol use-yes   . Drug use-no   . regular exercise-yes    Social History Main Topics  . Smoking status: Former Smoker    Quit date: 08/04/1978  . Smokeless tobacco: Not on file  . Alcohol Use: Yes     Comment: 1-2 drinks a few times per week  . Drug Use: No  . Sexual Activity: Not on file   Other Topics Concern  . Not on file   Social History Narrative  . No narrative on file     BP 108/66  Pulse 59  Ht 6\' 2"  (1.88 m)  Wt 182 lb 6.4 oz (82.736 kg)  BMI 23.41 kg/m2  Physical Exam:  Well appearing 68 yo man, NAD HEENT: Unremarkable Neck:  No JVD, no thyromegally Back:  No CVA tenderness Lungs:  Clear with no wheezes HEART:  Regular rate rhythm, no murmurs, no rubs, no clicks Abd:  soft, positive bowel sounds, no organomegally, no rebound, no guarding Ext:  2 plus pulses, no edema, no cyanosis, no clubbing Skin:  No rashes no nodules Neuro:  CN II through XII intact, motor grossly intact  EKG -nsr   Assess/Plan:

## 2014-03-30 NOTE — Assessment & Plan Note (Signed)
His symptoms are class 1. I suspect he will have normalization of his LV function. Will repeat echo in 2 months. He will continue his beta blocker and ACE inhibitor for now. If his EF does not improve, I will switch atenolol to coreg.

## 2014-03-30 NOTE — Patient Instructions (Addendum)
Your physician recommends that you schedule a follow-up appointment in: November with Dr Lovena Le after echo  Your physician has requested that you have an echocardiogram. Echocardiography is a painless test that uses sound waves to create images of your heart. It provides your doctor with information about the size and shape of your heart and how well your heart's chambers and valves are working. This procedure takes approximately one hour. There are no restrictions for this procedure.---first week of Nov

## 2014-03-30 NOTE — Assessment & Plan Note (Signed)
He appears to be maintaining NSR after DC CV. He will continue systemic anti-coagulation for now. If his EF normalizes, then we will stop his anti-coagulation.

## 2014-04-05 ENCOUNTER — Institutional Professional Consult (permissible substitution): Payer: Medicare Other | Admitting: Internal Medicine

## 2014-04-06 ENCOUNTER — Telehealth: Payer: Self-pay | Admitting: Internal Medicine

## 2014-04-06 ENCOUNTER — Encounter: Payer: Self-pay | Admitting: Family Medicine

## 2014-04-06 ENCOUNTER — Ambulatory Visit (INDEPENDENT_AMBULATORY_CARE_PROVIDER_SITE_OTHER): Payer: Medicare Other | Admitting: Family Medicine

## 2014-04-06 VITALS — BP 94/68 | HR 68 | Temp 97.7°F | Ht 74.0 in | Wt 178.0 lb

## 2014-04-06 DIAGNOSIS — E785 Hyperlipidemia, unspecified: Secondary | ICD-10-CM | POA: Diagnosis not present

## 2014-04-06 DIAGNOSIS — R351 Nocturia: Secondary | ICD-10-CM | POA: Diagnosis not present

## 2014-04-06 DIAGNOSIS — Z23 Encounter for immunization: Secondary | ICD-10-CM | POA: Diagnosis not present

## 2014-04-06 DIAGNOSIS — Z87448 Personal history of other diseases of urinary system: Secondary | ICD-10-CM | POA: Diagnosis not present

## 2014-04-06 DIAGNOSIS — N138 Other obstructive and reflux uropathy: Secondary | ICD-10-CM | POA: Diagnosis not present

## 2014-04-06 DIAGNOSIS — I484 Atypical atrial flutter: Secondary | ICD-10-CM

## 2014-04-06 DIAGNOSIS — I10 Essential (primary) hypertension: Secondary | ICD-10-CM | POA: Diagnosis not present

## 2014-04-06 DIAGNOSIS — I4892 Unspecified atrial flutter: Secondary | ICD-10-CM

## 2014-04-06 DIAGNOSIS — F528 Other sexual dysfunction not due to a substance or known physiological condition: Secondary | ICD-10-CM

## 2014-04-06 DIAGNOSIS — N401 Enlarged prostate with lower urinary tract symptoms: Secondary | ICD-10-CM | POA: Diagnosis not present

## 2014-04-06 LAB — POCT URINALYSIS DIPSTICK
BILIRUBIN UA: NEGATIVE
Blood, UA: NEGATIVE
Glucose, UA: NEGATIVE
KETONES UA: NEGATIVE
LEUKOCYTES UA: NEGATIVE
Nitrite, UA: NEGATIVE
PROTEIN UA: NEGATIVE
Spec Grav, UA: 1.015
Urobilinogen, UA: 0.2
pH, UA: 6

## 2014-04-06 LAB — HEPATIC FUNCTION PANEL
ALT: 24 U/L (ref 0–53)
AST: 29 U/L (ref 0–37)
Albumin: 4.1 g/dL (ref 3.5–5.2)
Alkaline Phosphatase: 52 U/L (ref 39–117)
Bilirubin, Direct: 0.2 mg/dL (ref 0.0–0.3)
Total Bilirubin: 1.2 mg/dL (ref 0.2–1.2)
Total Protein: 6.6 g/dL (ref 6.0–8.3)

## 2014-04-06 LAB — LIPID PANEL
CHOLESTEROL: 137 mg/dL (ref 0–200)
HDL: 43.8 mg/dL (ref >39.00)
LDL CALC: 80 mg/dL (ref 0–99)
NonHDL: 93.2
TRIGLYCERIDES: 67 mg/dL (ref 0.0–149.0)
Total CHOL/HDL Ratio: 3
VLDL: 13.4 mg/dL (ref 0.0–40.0)

## 2014-04-06 LAB — CBC WITH DIFFERENTIAL/PLATELET
BASOS ABS: 0 10*3/uL (ref 0.0–0.1)
Basophils Relative: 0.5 % (ref 0.0–3.0)
Eosinophils Absolute: 0.2 10*3/uL (ref 0.0–0.7)
Eosinophils Relative: 2.9 % (ref 0.0–5.0)
HEMATOCRIT: 46 % (ref 39.0–52.0)
Hemoglobin: 15.4 g/dL (ref 13.0–17.0)
LYMPHS ABS: 2.1 10*3/uL (ref 0.7–4.0)
Lymphocytes Relative: 34 % (ref 12.0–46.0)
MCHC: 33.5 g/dL (ref 30.0–36.0)
MCV: 93.2 fl (ref 78.0–100.0)
MONO ABS: 0.5 10*3/uL (ref 0.1–1.0)
MONOS PCT: 8.2 % (ref 3.0–12.0)
NEUTROS PCT: 54.4 % (ref 43.0–77.0)
Neutro Abs: 3.3 10*3/uL (ref 1.4–7.7)
PLATELETS: 231 10*3/uL (ref 150.0–400.0)
RBC: 4.94 Mil/uL (ref 4.22–5.81)
RDW: 13.3 % (ref 11.5–15.5)
WBC: 6.1 10*3/uL (ref 4.0–10.5)

## 2014-04-06 LAB — PSA: PSA: 0.78 ng/mL (ref 0.10–4.00)

## 2014-04-06 LAB — TSH: TSH: 2.11 u[IU]/mL (ref 0.35–4.50)

## 2014-04-06 MED ORDER — NIACIN ER (ANTIHYPERLIPIDEMIC) 1000 MG PO TBCR: EXTENDED_RELEASE_TABLET | ORAL | Status: DC

## 2014-04-06 MED ORDER — SILDENAFIL CITRATE 20 MG PO TABS: ORAL_TABLET | ORAL | Status: DC

## 2014-04-06 MED ORDER — ATENOLOL 25 MG PO TABS: ORAL_TABLET | ORAL | Status: DC

## 2014-04-06 MED ORDER — ATORVASTATIN CALCIUM 20 MG PO TABS: ORAL_TABLET | ORAL | Status: DC

## 2014-04-06 MED ORDER — SILDENAFIL CITRATE 50 MG PO TABS: 50.0000 mg | ORAL_TABLET | ORAL | Status: DC

## 2014-04-06 NOTE — Telephone Encounter (Signed)
New message      Returning [facility] call from yesterday

## 2014-04-06 NOTE — Progress Notes (Signed)
   Subjective:    Patient ID: Garrett Vargas, male    DOB: 02-02-1946, 68 y.o.   MRN: [id number]  HPI Dr. 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 Fortunately it was only x1. He's been in sinus rhythm since. He's on a blood thinner, Toprol and 5 mg of Tenormin, lisinopril 2.5 mg daily. BP 90/68 today pulse 70 and regular. We may want to back off on the lisinopril.  He gets routine eye care, dental care, colonoscopy this year showed a couple polyps he's due to go back in 3 years for followup. Rest as med list is unchanged.   Review of Systems  Constitutional: Negative.   HENT: Negative.   Eyes: Negative.   Respiratory: Negative.   Cardiovascular: Negative.   Gastrointestinal: Negative.   Genitourinary: Negative.   Musculoskeletal: Negative.   Skin: Negative.   Neurological: Negative.   Psychiatric/Behavioral: Negative.        Objective:   Physical Exam  Nursing note and vitals reviewed. Constitutional: He is oriented to person, place, and time. He appears well-developed and well-nourished.  HENT:  Head: Normocephalic and atraumatic.  Right Ear: External ear normal.  Left Ear: External ear normal.  Nose: Nose normal.  Mouth/Throat: Oropharynx is clear and moist.  Eyes: Conjunctivae and EOM are normal. Pupils are equal, round, and reactive to light.  Neck: Normal range of motion. Neck supple. No JVD present. No tracheal deviation present. No thyromegaly present.  Cardiovascular: Normal rate, regular rhythm, normal heart sounds and intact distal pulses.  Exam reveals no gallop and no friction rub.   No murmur heard. BP 100/60 pulse 70 and regular  No carotid or icterus peripheral pulses 2+ and symmetrical  Pulmonary/Chest: Effort normal and breath sounds normal. No  stridor. No respiratory distress. He has no wheezes. He has no rales. He exhibits no tenderness.  Abdominal: Soft. Bowel sounds are normal. He exhibits no distension and no mass. There is no tenderness. There is no rebound and no guarding.  Genitourinary: Rectum normal and penis normal. Guaiac negative stool. No penile tenderness.  1+ symmetrical BPH  Musculoskeletal: Normal range of motion. He exhibits no edema and no tenderness.  Lymphadenopathy:    He has no cervical adenopathy.  Neurological: He is alert and oriented to person, place, and time. He has normal reflexes. No cranial nerve deficit. He exhibits normal muscle tone.  Skin: Skin is warm and dry. No rash noted. No erythema. No pallor.  Psychiatric: He has a normal mood and affect. His behavior is normal. Judgment and thought content normal.          Assessment & Plan:  Healthy male  Hyperlipidemia continue Lipitor and aspirin  Recent episode of A. Fib,,,,,,,, continue beta blocker 12.5 mg a day hold the lisinopril because BP too low 100/60  Continue blood thinner  Viagra when necessary for ED

## 2014-04-06 NOTE — Patient Instructions (Signed)
Stop the lisinopril  Continue the beta blocker....... one half tab daily  Blood pressure daily in the morning........omron digital pump up blood pressure cuff.......Marland Kitchen Garrett Vargas  Call in 3 weeks so we can review your blood pressure readings  Continue other medications

## 2014-04-06 NOTE — Progress Notes (Signed)
Pre visit review using our clinic review tool, if applicable. No additional management support is needed unless otherwise documented below in the visit note. 

## 2014-04-06 NOTE — Telephone Encounter (Signed)
Spoke with patient and called insurance regarding coverage of Xarelto, Pradaxa, Eliquis, and Savaysa. When he called insurance company doses needed to determine coverage. Discussed with Faye Ramsay D Pradaxa 150 mg BID, Eliquis 5 mg BID, and Savaysa not really recommended since his kidney functions good. Advised patient.

## 2014-04-07 ENCOUNTER — Telehealth: Payer: Self-pay | Admitting: Internal Medicine

## 2014-04-07 DIAGNOSIS — I480 Paroxysmal atrial fibrillation: Secondary | ICD-10-CM

## 2014-04-07 NOTE — Telephone Encounter (Signed)
Spoke with the patient and he has called [facility].  Xarelto 20mg  daily--needs PA- Go through [employer]

## 2014-04-07 NOTE — Telephone Encounter (Signed)
New message     Talk to the nurse regarding blood thinner

## 2014-04-11 ENCOUNTER — Ambulatory Visit (INDEPENDENT_AMBULATORY_CARE_PROVIDER_SITE_OTHER): Payer: Medicare Other | Admitting: *Deleted

## 2014-04-11 DIAGNOSIS — Z111 Encounter for screening for respiratory tuberculosis: Secondary | ICD-10-CM | POA: Diagnosis not present

## 2014-04-11 DIAGNOSIS — Z0000 Encounter for general adult medical examination without abnormal findings: Secondary | ICD-10-CM

## 2014-04-13 ENCOUNTER — Encounter: Payer: Self-pay | Admitting: Internal Medicine

## 2014-04-13 LAB — TB SKIN TEST
Induration: 0 mm
TB Skin Test: NEGATIVE

## 2014-04-13 MED ORDER — RIVAROXABAN 20 MG PO TABS: 20.0000 mg | ORAL_TABLET | Freq: Every day | ORAL | Status: DC

## 2014-04-13 NOTE — Telephone Encounter (Signed)
Patient wants me to send in a 30 day supply to [facility] to see if they will fill.  I explained to him that I think they fill 90 days only but can try to see what they will approve. He will let me know if I need to call in to [facility]

## 2014-04-13 NOTE — Telephone Encounter (Signed)
Garrett Vargas at 04/13/2014 9:15 AM     Status: Signed        Patient wants to know if you have completed prior authorization for his medication. Please call and advise.

## 2014-04-13 NOTE — Telephone Encounter (Addendum)
[place] [phone]  [phone]  Approved for 12 months   Patient aware

## 2014-04-13 NOTE — Telephone Encounter (Signed)
This encounter was created in error - please disregard.

## 2014-04-13 NOTE — Addendum Note (Signed)
Addended by: Janan Halter F on: 04/13/2014 10:14 AM   Modules accepted: Orders

## 2014-04-13 NOTE — Telephone Encounter (Signed)
°  Patient wants to know if you have completed prior authorization for his medication. Please call and advise.

## 2014-04-14 ENCOUNTER — Other Ambulatory Visit: Payer: Self-pay

## 2014-04-14 DIAGNOSIS — I480 Paroxysmal atrial fibrillation: Secondary | ICD-10-CM

## 2014-04-14 MED ORDER — RIVAROXABAN 20 MG PO TABS: 20.0000 mg | ORAL_TABLET | Freq: Every day | ORAL | Status: DC

## 2014-05-16 ENCOUNTER — Ambulatory Visit (HOSPITAL_COMMUNITY): Payer: Medicare Other | Attending: Internal Medicine | Admitting: Cardiology

## 2014-05-16 DIAGNOSIS — Z87891 Personal history of nicotine dependence: Secondary | ICD-10-CM | POA: Insufficient documentation

## 2014-05-16 DIAGNOSIS — E785 Hyperlipidemia, unspecified: Secondary | ICD-10-CM | POA: Diagnosis not present

## 2014-05-16 DIAGNOSIS — I480 Paroxysmal atrial fibrillation: Secondary | ICD-10-CM | POA: Diagnosis not present

## 2014-05-16 NOTE — Progress Notes (Signed)
Echo performed. 

## 2014-05-19 ENCOUNTER — Other Ambulatory Visit: Payer: Self-pay

## 2014-05-30 ENCOUNTER — Encounter: Payer: Self-pay | Admitting: Internal Medicine

## 2014-05-30 ENCOUNTER — Ambulatory Visit (INDEPENDENT_AMBULATORY_CARE_PROVIDER_SITE_OTHER): Payer: Medicare Other | Admitting: Internal Medicine

## 2014-05-30 VITALS — BP 102/74 | HR 72 | Ht 74.0 in | Wt 182.0 lb

## 2014-05-30 DIAGNOSIS — I480 Paroxysmal atrial fibrillation: Secondary | ICD-10-CM | POA: Diagnosis not present

## 2014-05-30 DIAGNOSIS — I519 Heart disease, unspecified: Secondary | ICD-10-CM

## 2014-05-30 DIAGNOSIS — I10 Essential (primary) hypertension: Secondary | ICD-10-CM

## 2014-05-30 NOTE — Assessment & Plan Note (Signed)
His blood pressure is now actually low, an essentially no blood pressure lowering medications. At this point I would conclude that the patient does not have systemic hypertension.

## 2014-05-30 NOTE — Assessment & Plan Note (Signed)
Today we discussed the treatment options in detail. The patient knows when he goes into atrial fibrillation. He has had approximately 5 years if not longer from his last episode of atrial fibrillation. Continuing systemic anticoagulation and stopping systemic anticoagulation were both discussed including the pros and cons of each. If he goes back into atrial fibrillation, I would recommend lifelong systemic anticoagulation. Because his left ventricular function has normalized, I've recommended that the patient discontinue systemic anti-coagulation for now, as there is a small bleeding risk with these medications, and the patient checks his blood pressure and heart rate every day. If he goes back into atrial fibrillation, he is instructed to restart systemic anticoagulation and call our office. He will continue low-dose beta blocker therapy as well. Finally, I've asked that he limit himself to one caffeinated beverage in one alcoholic beverage per day

## 2014-05-30 NOTE — Patient Instructions (Signed)
Your physician wants you to follow-up in: 6 months with Dr. Knox Saliva will receive a reminder letter in the mail two months in advance. If you don't receive a letter, please call our office to schedule the follow-up appointment.  Your physician has requested that you have an echocardiogram in 6 months- just prior to your follow up with Dr. Lovena Le. Echocardiography is a painless test that uses sound waves to create images of your heart. It provides your doctor with information about the size and shape of your heart and how well your heart's chambers and valves are working. This procedure takes approximately one hour. There are no restrictions for this procedure.  Your physician recommends that you continue on your current medications as directed. Please refer to the Current Medication list given to you today.

## 2014-05-30 NOTE — Progress Notes (Signed)
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 He is a pleasant 68 yo man with a h/o remote atrial fib, who had been in NSR for years and developed atrial fib/flutter and underwent TEE guided cardioversion. At that time he was found to have LV dysfunction and was placed on an ACE inhibitor in addition to his beta blocker. He feels well. No recurrent symptoms of atrial fib/flutter. No CHF symptoms. He has stopped his ACE inhibitor. He was hypotensive on this medication. The patient underwent repeat 2-D echo, demonstrating normalization of his left ventricular function, approximately 6 weeks after undergoing cardioversion. He has had no recurrent symptomatic atrial fibrillation. He remains active, and has had no limitation to activity. No Known Allergies   Current Outpatient Prescriptions  Medication Sig Dispense Refill  . aspirin 81 MG EC tablet Take 81 mg by mouth daily.        Marland Kitchen atenolol (TENORMIN) 25 MG tablet Take one half tablet by mouth daily  50 tablet  3  . atorvastatin (LIPITOR) 20 MG tablet Take 1 tablet by mouth  daily  100 tablet  3  . Multiple Vitamins-Minerals (CENTRUM SILVER) tablet Take 1 tablet by mouth daily.        . niacin (NIASPAN) 1000 MG CR tablet Take 1 tablet (1,000 mg  total) by mouth at bedtime.  100 tablet  1  . omega-3 acid ethyl esters (LOVAZA) 1 G capsule Take 1 g by mouth 2 (two) times daily.      . rivaroxaban (XARELTO) 20 MG TABS tablet Take 1 tablet (20 mg total) by mouth daily with supper.  30 tablet  1  . sildenafil (VIAGRA) 50 MG tablet Take 1 tablet (50 mg total) by mouth as directed.  10 tablet  11   No current facility-administered medications for this visit.     Past Medical History  Diagnosis Date  . ALLERGIC RHINITIS   . A-fib   . Hyperlipemia   . Erectile dysfunction   . S/P lateral meniscectomy of left knee     ROS:   All systems reviewed and negative except as noted in the HPI.   Past Surgical History  Procedure Laterality Date    . Multiple knee,elbow and foot surgeries    . Neuroma left foot,2010, dr. Rinaldo Cloud without cardioversion N/A 03/17/2014    Procedure: TRANSESOPHAGEAL ECHOCARDIOGRAM (TEE);  Surgeon: Sanda Klein, MD;  Location: [facility];  Service: Cardiovascular;  Laterality: N/A;  . Cardioversion N/A 03/17/2014    Procedure: CARDIOVERSION;  Surgeon: Sanda Klein, MD;  Location: [facility];  Service: Cardiovascular;  Laterality: N/A;     Family History  Problem Relation Age of Onset  . Coronary artery disease    . Prostate cancer    . Stroke       History   Social History  . Marital Status: Married    Spouse Name: N/A    Number of Children: N/A  . 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 alocohol use-yes   . Drug use-no   . regular exercise-yes    Social History Main Topics  . Smoking status: Former Smoker    Quit date: 08/04/1978  . Smokeless tobacco: Not on file  . Alcohol Use: Yes     Comment: 1-2 drinks a few times per week  . Drug Use: No  . Sexual Activity: Not on file  Other Topics Concern  . Not on file   Social History Narrative  . No narrative on file     BP 102/74  Pulse 72  Ht 6\' 2"  (1.88 m)  Wt 182 lb (82.555 kg)  BMI 23.36 kg/m2  Physical Exam:  Well appearing 68 yo man, NAD HEENT: Unremarkable Neck:  No JVD, no thyromegally Back:  No CVA tenderness Lungs:  Clear with no wheezes HEART:  Regular rate rhythm, no murmurs, no rubs, no clicks Abd:  soft, positive bowel sounds, no organomegally, no rebound, no guarding Ext:  2 plus pulses, no edema, no cyanosis, no clubbing Skin:  No rashes no nodules Neuro:  CN II through XII intact, motor grossly intact  EKG -nsr with incomplete right bundle branch block   Assess/Plan:

## 2014-06-28 DIAGNOSIS — B070 Plantar wart: Secondary | ICD-10-CM | POA: Diagnosis not present

## 2014-08-07 ENCOUNTER — Ambulatory Visit (INDEPENDENT_AMBULATORY_CARE_PROVIDER_SITE_OTHER): Payer: Medicare Other | Admitting: Podiatry

## 2014-08-07 ENCOUNTER — Encounter: Payer: Self-pay | Admitting: Podiatry

## 2014-08-07 VITALS — BP 118/76 | HR 64 | Resp 16 | Ht 74.0 in | Wt 180.0 lb

## 2014-08-07 DIAGNOSIS — D369 Benign neoplasm, unspecified site: Secondary | ICD-10-CM

## 2014-08-07 DIAGNOSIS — D2372 Other benign neoplasm of skin of left lower limb, including hip: Secondary | ICD-10-CM | POA: Diagnosis not present

## 2014-08-07 NOTE — Progress Notes (Signed)
   Subjective:    Patient ID: Garrett Vargas, male    DOB: 1946-01-27, 69 y.o.   MRN: [id number]  HPI Comments: Warts on the bottom of the left foot that has been there for 4-5 months. Tried freezing off and scraping .      Review of Systems  All other systems reviewed and are negative.      Objective:   Physical Exam        Assessment & Plan:

## 2014-08-07 NOTE — Progress Notes (Signed)
Subjective:     Patient ID: Garrett Vargas, male   DOB: Jan 23, 1946, 69 y.o.   MRN: [id number]  HPI patient presents with painful lesion on the plantar aspect of the left foot that have been making it hard for him to walk and he does not remember specific foreign body or injury   Review of Systems     Objective:   Physical Exam Neurovascular status intact with no change in health history and noted to have 3 distinct lesions plantar left that upon debridement show pinpoint bleeding and pain to lateral pressure    Assessment:     Verruca plantaris plantar aspect left foot    Plan:     Reviewed condition debris did lesions and recommended chemical aggressive treatment. Patient tolerated procedure well and sterile dressing applied and reappoint in 4 weeks

## 2014-08-08 ENCOUNTER — Telehealth: Payer: Self-pay | Admitting: Family Medicine

## 2014-08-08 NOTE — Telephone Encounter (Signed)
Documented in chart.

## 2014-08-08 NOTE — Telephone Encounter (Signed)
Pt DID have a flu shot, sometime in the fall 2015 at Dr Bonner Puna office, a friend/collegue of his.

## 2014-08-28 ENCOUNTER — Encounter: Payer: Self-pay | Admitting: Podiatry

## 2014-08-28 ENCOUNTER — Ambulatory Visit (INDEPENDENT_AMBULATORY_CARE_PROVIDER_SITE_OTHER): Payer: Medicare Other | Admitting: Podiatry

## 2014-08-28 VITALS — BP 112/74 | HR 95 | Resp 16

## 2014-08-28 DIAGNOSIS — D369 Benign neoplasm, unspecified site: Secondary | ICD-10-CM

## 2014-08-28 DIAGNOSIS — D2272 Melanocytic nevi of left lower limb, including hip: Secondary | ICD-10-CM

## 2014-08-28 NOTE — Progress Notes (Signed)
Subjective:     Patient ID: Garrett Vargas, male   DOB: [birth year], 69 y.o.   MRN: [id number]  HPI patient presents stating that the lesions are improving but I did have discomfort and some enlargement after the procedure was performed   Review of Systems     Objective:   Physical Exam Neurovascular status intact with keratotic lesion sub-left third and fourth metatarsal that upon debridement continue to show pinpoint bleeding    Assessment:     Verruca plantaris left that is improving but present    Plan:     Debrided the tissue fully and applied chemical and applied sterile dressing. Reappoint to recheck

## 2014-09-02 DIAGNOSIS — J159 Unspecified bacterial pneumonia: Secondary | ICD-10-CM | POA: Diagnosis not present

## 2014-09-02 DIAGNOSIS — R0989 Other specified symptoms and signs involving the circulatory and respiratory systems: Secondary | ICD-10-CM | POA: Diagnosis not present

## 2014-11-02 DIAGNOSIS — Z471 Aftercare following joint replacement surgery: Secondary | ICD-10-CM | POA: Diagnosis not present

## 2014-11-02 DIAGNOSIS — M25552 Pain in left hip: Secondary | ICD-10-CM | POA: Diagnosis not present

## 2014-11-02 DIAGNOSIS — Z96652 Presence of left artificial knee joint: Secondary | ICD-10-CM | POA: Diagnosis not present

## 2014-11-28 DIAGNOSIS — M1612 Unilateral primary osteoarthritis, left hip: Secondary | ICD-10-CM | POA: Diagnosis not present

## 2014-12-15 ENCOUNTER — Other Ambulatory Visit: Payer: Self-pay | Admitting: *Deleted

## 2014-12-15 DIAGNOSIS — E785 Hyperlipidemia, unspecified: Secondary | ICD-10-CM

## 2014-12-15 MED ORDER — NIACIN ER (ANTIHYPERLIPIDEMIC) 1000 MG PO TBCR: EXTENDED_RELEASE_TABLET | ORAL | Status: DC

## 2015-04-30 ENCOUNTER — Encounter: Payer: Medicare Other | Admitting: Family Medicine

## 2015-05-08 DIAGNOSIS — M1612 Unilateral primary osteoarthritis, left hip: Secondary | ICD-10-CM | POA: Diagnosis not present

## 2015-05-09 ENCOUNTER — Encounter: Payer: Self-pay | Admitting: Family Medicine

## 2015-05-09 ENCOUNTER — Ambulatory Visit (INDEPENDENT_AMBULATORY_CARE_PROVIDER_SITE_OTHER): Payer: Medicare Other | Admitting: Family Medicine

## 2015-05-09 VITALS — BP 130/88 | Temp 98.3°F | Ht 73.75 in | Wt 186.0 lb

## 2015-05-09 DIAGNOSIS — N401 Enlarged prostate with lower urinary tract symptoms: Secondary | ICD-10-CM

## 2015-05-09 DIAGNOSIS — E785 Hyperlipidemia, unspecified: Secondary | ICD-10-CM | POA: Diagnosis not present

## 2015-05-09 DIAGNOSIS — Z23 Encounter for immunization: Secondary | ICD-10-CM

## 2015-05-09 DIAGNOSIS — I10 Essential (primary) hypertension: Secondary | ICD-10-CM | POA: Diagnosis not present

## 2015-05-09 DIAGNOSIS — R351 Nocturia: Secondary | ICD-10-CM | POA: Diagnosis not present

## 2015-05-09 DIAGNOSIS — F528 Other sexual dysfunction not due to a substance or known physiological condition: Secondary | ICD-10-CM

## 2015-05-09 DIAGNOSIS — I484 Atypical atrial flutter: Secondary | ICD-10-CM

## 2015-05-09 DIAGNOSIS — I480 Paroxysmal atrial fibrillation: Secondary | ICD-10-CM

## 2015-05-09 LAB — CBC WITH DIFFERENTIAL/PLATELET
BASOS PCT: 0.4 % (ref 0.0–3.0)
Basophils Absolute: 0.1 10*3/uL (ref 0.0–0.1)
EOS PCT: 0.4 % (ref 0.0–5.0)
Eosinophils Absolute: 0.1 10*3/uL (ref 0.0–0.7)
HCT: 47.5 % (ref 39.0–52.0)
Hemoglobin: 15.8 g/dL (ref 13.0–17.0)
Lymphocytes Relative: 15.3 % (ref 12.0–46.0)
Lymphs Abs: 2.4 10*3/uL (ref 0.7–4.0)
MCHC: 33.1 g/dL (ref 30.0–36.0)
MCV: 93.9 fl (ref 78.0–100.0)
MONO ABS: 1 10*3/uL (ref 0.1–1.0)
Monocytes Relative: 6.2 % (ref 3.0–12.0)
NEUTROS ABS: 12 10*3/uL — AB (ref 1.4–7.7)
NEUTROS PCT: 77.7 % — AB (ref 43.0–77.0)
PLATELETS: 244 10*3/uL (ref 150.0–400.0)
RBC: 5.06 Mil/uL (ref 4.22–5.81)
RDW: 13.5 % (ref 11.5–15.5)
WBC: 15.4 10*3/uL — ABNORMAL HIGH (ref 4.0–10.5)

## 2015-05-09 LAB — POCT URINALYSIS DIPSTICK
Bilirubin, UA: NEGATIVE
Glucose, UA: NEGATIVE
KETONES UA: NEGATIVE
Leukocytes, UA: NEGATIVE
Nitrite, UA: NEGATIVE
PH UA: 6
RBC UA: NEGATIVE
SPEC GRAV UA: 1.025
Urobilinogen, UA: 0.2

## 2015-05-09 LAB — BASIC METABOLIC PANEL
BUN: 14 mg/dL (ref 6–23)
CALCIUM: 9.9 mg/dL (ref 8.4–10.5)
CO2: 32 meq/L (ref 19–32)
CREATININE: 0.81 mg/dL (ref 0.40–1.50)
Chloride: 101 mEq/L (ref 96–112)
GFR: 100.21 mL/min (ref >60.00)
Glucose, Bld: 105 mg/dL — ABNORMAL HIGH (ref 70–99)
Potassium: 4.6 mEq/L (ref 3.5–5.1)
Sodium: 140 mEq/L (ref 135–145)

## 2015-05-09 LAB — LIPID PANEL
CHOLESTEROL: 171 mg/dL (ref 0–200)
HDL: 65.8 mg/dL (ref >39.00)
LDL Cholesterol: 95 mg/dL (ref 0–99)
NonHDL: 105.53
TRIGLYCERIDES: 55 mg/dL (ref 0.0–149.0)
Total CHOL/HDL Ratio: 3
VLDL: 11 mg/dL (ref 0.0–40.0)

## 2015-05-09 LAB — HEPATIC FUNCTION PANEL
ALT: 25 U/L (ref 0–53)
AST: 25 U/L (ref 0–37)
Albumin: 4.4 g/dL (ref 3.5–5.2)
Alkaline Phosphatase: 55 U/L (ref 39–117)
BILIRUBIN DIRECT: 0.2 mg/dL (ref 0.0–0.3)
BILIRUBIN TOTAL: 1.1 mg/dL (ref 0.2–1.2)
Total Protein: 6.8 g/dL (ref 6.0–8.3)

## 2015-05-09 LAB — TSH: TSH: 1.34 u[IU]/mL (ref 0.35–4.50)

## 2015-05-09 LAB — PSA: PSA: 1.03 ng/mL (ref 0.10–4.00)

## 2015-05-09 MED ORDER — ATENOLOL 25 MG PO TABS: ORAL_TABLET | ORAL | Status: DC

## 2015-05-09 MED ORDER — SILDENAFIL CITRATE 100 MG PO TABS: 50.0000 mg | ORAL_TABLET | Freq: Every day | ORAL | Status: DC | PRN

## 2015-05-09 MED ORDER — ATORVASTATIN CALCIUM 20 MG PO TABS: ORAL_TABLET | ORAL | Status: DC

## 2015-05-09 MED ORDER — SILDENAFIL CITRATE 20 MG PO TABS: ORAL_TABLET | ORAL | Status: DC

## 2015-05-09 MED ORDER — NIACIN ER (ANTIHYPERLIPIDEMIC) 1000 MG PO TBCR: EXTENDED_RELEASE_TABLET | ORAL | Status: DC

## 2015-05-09 NOTE — Patient Instructions (Signed)
Continue your current medications  Return in one year for general physical examination sooner if any problems  Diplomatic Services operational officer........Marland Kitchen our new adult nurse practitioner from [facility] today......... I will call you if there is anything abnormal  Return sometime the next couple weeks for removal of the 2 lesions on your scalp

## 2015-05-09 NOTE — Progress Notes (Signed)
Pre visit review using our clinic review tool, if applicable. No additional management support is needed unless otherwise documented below in the visit note. 

## 2015-05-09 NOTE — Progress Notes (Signed)
Subjective:    Patient ID: Garrett Vargas, male    DOB: 05/30/1946, 69 y.o.   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 His pulse is 70 and regular he's had no recurrence of his arrhythmia. He saw Dr. Lovena Le at one point who recommended no further medication. He's also been taking off his anticoagulant. He does take an aspirin tablet daily. He's had no arrhythmias palpitations etc. Therefore I do not think he needs to go back to see Dr. Lovena Le at this juncture  He takes Lipitor 20 mg and Niaspan 1000 mg for hyperlipidemia. Will check labs today  He uses Viagra 50 mg when necessary for ED he would like to increase the dose it doesn't seem to be working as well as it did in the past.  Left knee was replaced 2012 is can have his left hip replaced December 2016 by Dr. Reynaldo Minium.  He gets routine eye care, dental care, colonoscopy 2015 normal, vaccinations updated he was given a tetanus booster today.  Colonoscopy repeated in 3 years because of a history of polyps.  Cognitive function normal he walks daily home health safety reviewed no issues identified. He has a Retail banker and has guns in the house but they're all locked up. He does have a healthcare power of attorney and living well  This physical since its normal will clear him for hip surgery.   Review of Systems  Constitutional: Negative.   HENT: Negative.   Eyes: Negative.   Respiratory: Negative.   Cardiovascular: Negative.   Gastrointestinal: Negative.   Endocrine: Negative.   Genitourinary: Negative.   Musculoskeletal: Negative.   Skin: Negative.   Allergic/Immunologic: Negative.   Neurological: Negative.   Hematological: Negative.   Psychiatric/Behavioral: Negative.         Objective:   Physical Exam  Constitutional: He is oriented to person, place, and time. He appears well-developed and well-nourished.  HENT:  Head: Normocephalic and atraumatic.  Right Ear: External ear normal.  Left Ear: External ear normal.  Nose: Nose normal.  Mouth/Throat: Oropharynx is clear and moist.  Eyes: Conjunctivae and EOM are normal. Pupils are equal, round, and reactive to light.  Neck: Normal range of motion. Neck supple. No JVD present. No tracheal deviation present. No thyromegaly present.  Cardiovascular: Normal rate, regular rhythm, normal heart sounds and intact distal pulses.  Exam reveals no gallop and no friction rub.   No murmur heard. No carotid neurologic bruits peripheral pulses 2+ and symmetrical  Pulmonary/Chest: Effort normal and breath sounds normal. No stridor. No respiratory distress. He has no wheezes. He has no rales. He exhibits no tenderness.  Abdominal: Soft. Bowel sounds are normal. He exhibits no distension and no mass. There is no tenderness. There is no rebound and no guarding.  Genitourinary: Rectum normal and penis normal. Guaiac negative stool. No penile tenderness.  1+ symmetrical nonnodular BPH  Musculoskeletal: Normal range of motion. He exhibits no edema or tenderness.  Lymphadenopathy:    He has no cervical adenopathy.  Neurological: He is alert and oriented to person, place, and time. He has normal reflexes. No cranial nerve deficit. He exhibits normal muscle tone.  Skin: Skin is warm and dry. No rash noted. No erythema. No pallor.  Total body skin exam  normal except for 2 actinic keratosis ptosis on his scalp. He'll return to have those removed  Psychiatric: He has a normal mood and affect. His behavior is normal. Judgment and thought content normal.  Nursing note and vitals reviewed.         Assessment & Plan:  Healthy male  Mild hypertension,,,,,,, contingent Tenormin 12.5 mg daily  Erectile dysfunction,,,,,,,,  increase Viagra to 100 mg daily  Hyperlipidemia.....Marland Kitchen continue Lipitor and Niaspan and aspirin..... Check labs  History of atrial flutter.......Marland Kitchen Asymptomatic.....Marland Kitchen okay for surgery  Status post left knee replacement 2012  Impending left hip replacement December 2016  Rotator cuff tear left shoulder also repaired surgically

## 2015-05-29 ENCOUNTER — Ambulatory Visit (HOSPITAL_COMMUNITY): Payer: Medicare Other | Attending: Internal Medicine

## 2015-05-29 ENCOUNTER — Other Ambulatory Visit: Payer: Self-pay | Admitting: Internal Medicine

## 2015-05-29 ENCOUNTER — Other Ambulatory Visit: Payer: Self-pay

## 2015-05-29 DIAGNOSIS — Z8249 Family history of ischemic heart disease and other diseases of the circulatory system: Secondary | ICD-10-CM | POA: Diagnosis not present

## 2015-05-29 DIAGNOSIS — I340 Nonrheumatic mitral (valve) insufficiency: Secondary | ICD-10-CM | POA: Diagnosis not present

## 2015-05-29 DIAGNOSIS — I480 Paroxysmal atrial fibrillation: Secondary | ICD-10-CM

## 2015-05-29 DIAGNOSIS — I4891 Unspecified atrial fibrillation: Secondary | ICD-10-CM | POA: Insufficient documentation

## 2015-05-29 DIAGNOSIS — I517 Cardiomegaly: Secondary | ICD-10-CM | POA: Diagnosis not present

## 2015-05-29 DIAGNOSIS — E785 Hyperlipidemia, unspecified: Secondary | ICD-10-CM | POA: Insufficient documentation

## 2015-05-29 DIAGNOSIS — I519 Heart disease, unspecified: Secondary | ICD-10-CM

## 2015-05-29 DIAGNOSIS — Z87891 Personal history of nicotine dependence: Secondary | ICD-10-CM | POA: Insufficient documentation

## 2015-05-29 DIAGNOSIS — I071 Rheumatic tricuspid insufficiency: Secondary | ICD-10-CM | POA: Diagnosis not present

## 2015-05-29 DIAGNOSIS — I10 Essential (primary) hypertension: Secondary | ICD-10-CM | POA: Diagnosis not present

## 2015-06-05 ENCOUNTER — Ambulatory Visit (INDEPENDENT_AMBULATORY_CARE_PROVIDER_SITE_OTHER): Payer: Medicare Other | Admitting: Family Medicine

## 2015-06-05 DIAGNOSIS — D72829 Elevated white blood cell count, unspecified: Secondary | ICD-10-CM | POA: Diagnosis not present

## 2015-06-05 DIAGNOSIS — L570 Actinic keratosis: Secondary | ICD-10-CM | POA: Diagnosis not present

## 2015-06-05 LAB — CBC WITH DIFFERENTIAL/PLATELET
BASOS ABS: 0 10*3/uL (ref 0.0–0.1)
BASOS PCT: 0.5 % (ref 0.0–3.0)
EOS ABS: 0.2 10*3/uL (ref 0.0–0.7)
Eosinophils Relative: 2.7 % (ref 0.0–5.0)
HEMATOCRIT: 46.8 % (ref 39.0–52.0)
Hemoglobin: 15.7 g/dL (ref 13.0–17.0)
LYMPHS ABS: 2.1 10*3/uL (ref 0.7–4.0)
Lymphocytes Relative: 32.6 % (ref 12.0–46.0)
MCHC: 33.6 g/dL (ref 30.0–36.0)
MCV: 93 fl (ref 78.0–100.0)
MONO ABS: 0.6 10*3/uL (ref 0.1–1.0)
Monocytes Relative: 9.9 % (ref 3.0–12.0)
NEUTROS ABS: 3.4 10*3/uL (ref 1.4–7.7)
NEUTROS PCT: 54.3 % (ref 43.0–77.0)
PLATELETS: 218 10*3/uL (ref 150.0–400.0)
RBC: 5.04 Mil/uL (ref 4.22–5.81)
RDW: 13.6 % (ref 11.5–15.5)
WBC: 6.4 10*3/uL (ref 4.0–10.5)

## 2015-06-05 NOTE — Progress Notes (Signed)
   Subjective:    Patient ID: Garrett Vargas, male    DOB: 05-19-1946, 69 y.o.   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 Clinically there appear to be actinic keratosis  Lesion #1,,,,,,,,,,, midline scalp,,,,,, anterior,,,,, 5 mm x 5 mm  Lesion #2,,,,,,,,,, inferior to #1,,,,,, 3 mm x 3 mm  After informed consent both lesions were cleaned with alcohol and is anesthetized with 1% Xylocaine with epinephrine. The lesions removed with 2 mm margins. Base was cauterized. Patient tolerated procedure well no complications  Screening lab showed a white count 15,400 with normal differential. Previous white counts been in the 01-6999.  We will repeat his CBC today,   Review of Systems     review of systems otherwise negative  Objective:   Physical Exam  Well-developed well-nourished male no acute distress lesions 2 as noted above removed as noted above      Assessment & Plan:Actinic keratosis clinically removed...  Actinic keratosis...... 2.......Marland Kitchen removed from the scalp is noted above  Elevated white cell count ,,,,,,,,,, repeat CBC .........Marland Kitchen

## 2015-06-05 NOTE — Patient Instructions (Signed)
We will repeat your CBC today  Within 2 weeks we will call you the report on the 2 lesions were removed........ if in 2 lesions we do not call you call us

## 2015-06-06 ENCOUNTER — Ambulatory Visit (INDEPENDENT_AMBULATORY_CARE_PROVIDER_SITE_OTHER): Payer: Medicare Other | Admitting: Internal Medicine

## 2015-06-06 ENCOUNTER — Encounter: Payer: Self-pay | Admitting: Internal Medicine

## 2015-06-06 VITALS — BP 110/70 | HR 68 | Ht 74.0 in | Wt 187.8 lb

## 2015-06-06 DIAGNOSIS — R002 Palpitations: Secondary | ICD-10-CM

## 2015-06-06 DIAGNOSIS — Z01810 Encounter for preprocedural cardiovascular examination: Secondary | ICD-10-CM | POA: Diagnosis not present

## 2015-06-06 DIAGNOSIS — I484 Atypical atrial flutter: Secondary | ICD-10-CM | POA: Diagnosis not present

## 2015-06-06 DIAGNOSIS — I10 Essential (primary) hypertension: Secondary | ICD-10-CM | POA: Diagnosis not present

## 2015-06-06 DIAGNOSIS — E785 Hyperlipidemia, unspecified: Secondary | ICD-10-CM

## 2015-06-06 NOTE — Progress Notes (Signed)
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 He is a pleasant 69 yo man with a h/o remote atrial fib, who had been in NSR for years and developed atrial fib/flutter and underwent TEE guided cardioversion approximately one year ago.. At that time he was found to have LV dysfunction and was placed on an ACE inhibitor in addition to his beta blocker. He feels well. No recurrent symptoms of atrial fib/flutter. No CHF symptoms. He has stopped his ACE inhibitor. He was hypotensive on this medication. The patient has undergone repeat 2-D echo, demonstrating persistent normalization of his left ventricular function, now over one year since returning to normal rhythm. He has had no recurrent symptomatic atrial fibrillation. He remains active, and has had no limitation to activity. He does note pain in his left hip, and is pending left hip surgery in the next few weeks.  No Known Allergies   Current Outpatient Prescriptions  Medication Sig Dispense Refill  . aspirin 81 MG EC tablet Take 81 mg by mouth daily.      Marland Kitchen atenolol (TENORMIN) 25 MG tablet Take one half tablet by mouth daily 50 tablet 3  . atorvastatin (LIPITOR) 20 MG tablet Take 1 tablet by mouth  daily 100 tablet 3  . Multiple Vitamins-Minerals (CENTRUM SILVER) tablet Take 1 tablet by mouth daily.      . niacin (NIASPAN) 1000 MG CR tablet Take 1 tablet (1,000 mg  total) by mouth at bedtime. 100 tablet 3  . omega-3 acid ethyl esters (LOVAZA) 1 G capsule Take 1 g by mouth 2 (two) times daily.    . sildenafil (VIAGRA) 100 MG tablet Take 0.5-1 tablets (50-100 mg total) by mouth daily as needed for erectile dysfunction. 20 tablet 6  . amoxicillin (AMOXIL) 500 MG capsule TAKE 4 CAPSULES 1 HOUR PRIOR TO APPOINTMENT  0  . sildenafil (REVATIO) 20 MG tablet Use as directed (Patient not taking: Reported on 06/06/2015) 20 tablet 6   No current facility-administered medications for this visit.     Past Medical History  Diagnosis Date  . ALLERGIC  RHINITIS   . A-fib ([place])   . Hyperlipemia   . Erectile dysfunction   . S/P lateral meniscectomy of left knee     ROS:   All systems reviewed and negative except as noted in the HPI.   Past Surgical History  Procedure Laterality Date  . Multiple knee,elbow and foot surgeries    . Neuroma left foot,2010, dr. Rinaldo Cloud without cardioversion N/A 03/17/2014    Procedure: TRANSESOPHAGEAL ECHOCARDIOGRAM (TEE);  Surgeon: Sanda Klein, MD;  Location: [facility];  Service: Cardiovascular;  Laterality: N/A;  . Cardioversion N/A 03/17/2014    Procedure: CARDIOVERSION;  Surgeon: Sanda Klein, MD;  Location: [facility];  Service: Cardiovascular;  Laterality: N/A;     Family History  Problem Relation Age of Onset  . Coronary artery disease    . Prostate cancer    . Stroke       Social History   Social History  . Marital Status: Married    Spouse Name: N/A  . Number of Children: N/A  . 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 alocohol use-yes   . Drug use-no   . regular exercise-yes    Social History Main Topics  . Smoking status: Former Smoker    Quit date: 08/04/1978  . Smokeless tobacco: Not on  file  . Alcohol Use: 0.0 oz/week    0 Standard drinks or equivalent per week     Comment: 1-2 drinks a few times per week  . Drug Use: No  . Sexual Activity: Not on file   Other Topics Concern  . Not on file   Social History Narrative     BP 110/70 mmHg  Pulse 68  Ht 6\' 2"  (1.88 m)  Wt 187 lb 12.8 oz (85.186 kg)  BMI 24.10 kg/m2  Physical Exam:  Well appearing 69 yo man, NAD HEENT: Unremarkable Neck:  No JVD, no thyromegally Back:  No CVA tenderness Lungs:  Clear with no wheezes HEART:  Regular rate rhythm, no murmurs, no rubs, no clicks Abd:  soft, positive bowel sounds, no organomegally, no rebound, no guarding Ext:  2 plus pulses, no edema, no cyanosis, no clubbing Skin:  No rashes no  nodules Neuro:  CN II through XII intact, motor grossly intact  EKG -nsr with incomplete right bundle branch block   Assess/Plan:

## 2015-06-06 NOTE — Assessment & Plan Note (Addendum)
The patient is low risk for major cardiovascular complications from his pending hip replacement surgery. If he develops atrial fibrillation, he will need systemic anticoagulation, and rate control. A repeat 2-D echo demonstrates normalization of his left ventricular function.

## 2015-06-06 NOTE — Patient Instructions (Addendum)
Your physician recommends that you continue on your current medications as directed. Please refer to the Current Medication list given to you today.  Your physician wants you to follow-up in: 1 year with Dr. Taylor.  You will receive a reminder letter in the mail two months in advance. If you don't receive a letter, please call our office to schedule the follow-up appointment.  

## 2015-06-06 NOTE — Assessment & Plan Note (Signed)
He will continue statin therapy and maintain a low-fat diet.

## 2015-06-06 NOTE — Assessment & Plan Note (Signed)
His blood pressure is well controlled. He will continue his current medical therapy.

## 2015-06-06 NOTE — Assessment & Plan Note (Signed)
He has had no recurrent atrial fibrillation and is currently asymptomatic. He is back on an aspirin a day. I have cautioned the patient that he could have recurrent atrial fibrillation around the time of his hip surgery.

## 2015-06-20 DIAGNOSIS — H524 Presbyopia: Secondary | ICD-10-CM | POA: Diagnosis not present

## 2015-06-20 DIAGNOSIS — H25013 Cortical age-related cataract, bilateral: Secondary | ICD-10-CM | POA: Diagnosis not present

## 2015-06-20 DIAGNOSIS — H52223 Regular astigmatism, bilateral: Secondary | ICD-10-CM | POA: Diagnosis not present

## 2015-06-20 DIAGNOSIS — H5203 Hypermetropia, bilateral: Secondary | ICD-10-CM | POA: Diagnosis not present

## 2015-06-26 ENCOUNTER — Ambulatory Visit: Payer: Self-pay | Admitting: Orthopedic Surgery

## 2015-06-26 NOTE — Progress Notes (Signed)
Preoperative surgical orders have been place into the Epic hospital system for Garrett Vargas on 06/26/2015, 5:22 PM  by Mickel Crow for surgery on 07-18-15.  Preop Total Hip - Anterior Approach orders including IV Tylenol, and IV Decadron as long as there are no contraindications to the above medications. Arlee Muslim, PA-C

## 2015-07-13 ENCOUNTER — Encounter (HOSPITAL_COMMUNITY)
Admission: RE | Admit: 2015-07-13 | Discharge: 2015-07-13 | Disposition: A | Discharge status: Home / Self Care | Payer: Medicare Other | Source: Ambulatory Visit | Attending: Orthopedic Surgery | Admitting: Orthopedic Surgery

## 2015-07-13 ENCOUNTER — Encounter (HOSPITAL_COMMUNITY): Payer: Self-pay

## 2015-07-13 DIAGNOSIS — Z01812 Encounter for preprocedural laboratory examination: Secondary | ICD-10-CM | POA: Diagnosis not present

## 2015-07-13 DIAGNOSIS — I4891 Unspecified atrial fibrillation: Secondary | ICD-10-CM | POA: Insufficient documentation

## 2015-07-13 DIAGNOSIS — E785 Hyperlipidemia, unspecified: Secondary | ICD-10-CM | POA: Diagnosis not present

## 2015-07-13 DIAGNOSIS — M1612 Unilateral primary osteoarthritis, left hip: Secondary | ICD-10-CM | POA: Insufficient documentation

## 2015-07-13 HISTORY — DX: Essential (primary) hypertension: I10

## 2015-07-13 HISTORY — DX: Unspecified osteoarthritis, unspecified site: M19.90

## 2015-07-13 HISTORY — DX: Personal history of other diseases of the respiratory system: Z87.09

## 2015-07-13 HISTORY — DX: Polyp of colon: K63.5

## 2015-07-13 HISTORY — DX: Cardiac arrhythmia, unspecified: I49.9

## 2015-07-13 LAB — APTT: aPTT: 30 s (ref 24–37)

## 2015-07-13 LAB — CBC
HCT: 47.5 % (ref 39.0–52.0)
Hemoglobin: 15.9 g/dL (ref 13.0–17.0)
MCH: 31.5 pg (ref 26.0–34.0)
MCHC: 33.5 g/dL (ref 30.0–36.0)
MCV: 94.1 fL (ref 78.0–100.0)
Platelets: 223 K/uL (ref 150–400)
RBC: 5.05 MIL/uL (ref 4.22–5.81)
RDW: 12.9 % (ref 11.5–15.5)
WBC: 6.4 K/uL (ref 4.0–10.5)

## 2015-07-13 LAB — COMPREHENSIVE METABOLIC PANEL WITH GFR
ALT: 27 U/L (ref 17–63)
AST: 34 U/L (ref 15–41)
Albumin: 4.1 g/dL (ref 3.5–5.0)
Alkaline Phosphatase: 53 U/L (ref 38–126)
Anion gap: 7 (ref 5–15)
BUN: 11 mg/dL (ref 6–20)
CO2: 30 mmol/L (ref 22–32)
Calcium: 9.4 mg/dL (ref 8.9–10.3)
Chloride: 103 mmol/L (ref 101–111)
Creatinine, Ser: 0.82 mg/dL (ref 0.61–1.24)
GFR calc Af Amer: >60 mL/min
GFR calc non Af Amer: >60 mL/min
Glucose, Bld: 80 mg/dL (ref 65–99)
Potassium: 4.4 mmol/L (ref 3.5–5.1)
Sodium: 140 mmol/L (ref 135–145)
Total Bilirubin: 1.1 mg/dL (ref 0.3–1.2)
Total Protein: 6.6 g/dL (ref 6.5–8.1)

## 2015-07-13 LAB — URINALYSIS, ROUTINE W REFLEX MICROSCOPIC
BILIRUBIN URINE: NEGATIVE
Glucose, UA: NEGATIVE mg/dL
HGB URINE DIPSTICK: NEGATIVE
Ketones, ur: NEGATIVE mg/dL
Leukocytes, UA: NEGATIVE
Nitrite: NEGATIVE
PH: 6.5 (ref 5.0–8.0)
Protein, ur: NEGATIVE mg/dL
SPECIFIC GRAVITY, URINE: 1.013 (ref 1.005–1.030)

## 2015-07-13 LAB — SURGICAL PCR SCREEN
MRSA, PCR: NEGATIVE
STAPHYLOCOCCUS AUREUS: NEGATIVE

## 2015-07-13 LAB — PROTIME-INR
INR: 1.08 (ref 0.00–1.49)
Prothrombin Time: 14.2 s (ref 11.6–15.2)

## 2015-07-13 NOTE — Progress Notes (Addendum)
Spoke with Dr Marcell Barlow / anesthesia in regards to EKG. No orders given. Anesthesia in to see pt day of surgery. EKG/epic 06/06/2015 (also copy per chart - 05/09/2015) Cardiac clearance per epic per Dr Lovena Le 06/06/2015 (also copy on chart) ECHO/epic 05/29/2015

## 2015-07-13 NOTE — Progress Notes (Signed)
Your patient has screened at an elevated risk for Obstructive Sleep Apnea using the Stop-Bang Tool during a pre-surgical visit. Patient scored at high risk.  

## 2015-07-13 NOTE — Patient Instructions (Signed)
Garrett Vargas  07/13/2015   Your procedure is scheduled on: Wednesday July 18, 2015   Report to [facility] Main  Entrance take [place]  elevators to 3rd floor to  [facility] at 6:30 AM.  Call this number if you have problems the morning of Vargas [phone]   Remember: ONLY 1 PERSON MAY GO WITH YOU TO SHORT STAY TO GET  READY MORNING OF Garrett Vargas.  Do not eat food or drink liquids :After Midnight.     Take these medicines the morning of Vargas with A SIP OF WATER: Atenolol                               You may not have any metal on your body including hair pins and              piercings  Do not wear jewelry, lotions, powders or colognes, deodorant                           Men may shave face and neck.   Do not bring valuables to the hospital. [facility]as.  Contacts, dentures or bridgework may not be worn into Vargas.  Leave suitcase in the car. After Vargas it may be brought to your room.                  Please read over the following fact sheets you were given:BLOOD TRANSFUSION INFORMATION SHEET; Garrett Vargas _____________________________________________________________________             Garrett Vargas Before Vargas, you can play an important role.  Because skin is not sterile, your skin needs to be as free of germs as possible.  You can reduce the number of germs on your skin by washing with CHG (chlorahexidine gluconate) soap before Vargas.  CHG is an antiseptic cleaner which kills germs and bonds with the skin to continue killing germs even after washing. Please DO NOT use if you have an allergy to CHG or antibacterial soaps.  If your skin becomes reddened/irritated stop using the CHG and inform your nurse when you arrive at Short Stay. Do not shave (including legs and underarms) for at least 48 hours prior to the first CHG shower.  You may shave your  face/neck. Please follow these instructions carefully:  1.  Shower with CHG Soap the night before Vargas and the  morning of Vargas.  2.  If you choose to wash your hair, wash your hair first as usual with your  normal  shampoo.  3.  After you shampoo, rinse your hair and body thoroughly to remove the  shampoo.                           4.  Use CHG as you would any other liquid soap.  You can apply chg directly  to the skin and wash                       Gently with a scrungie or clean washcloth.  5.  Apply the CHG Soap to your body ONLY FROM THE NECK  DOWN.   Do not use on face/ open                           Wound or open sores. Avoid contact with eyes, ears mouth and genitals (private parts).                       Wash face,  Genitals (private parts) with your normal soap.             6.  Wash thoroughly, paying special attention to the area where your Vargas  will be performed.  7.  Thoroughly rinse your body with warm water from the neck down.  8.  DO NOT shower/wash with your normal soap after using and rinsing off  the CHG Soap.                9.  Pat yourself dry with a clean towel.            10.  Wear clean pajamas.            11.  Place clean sheets on your bed the night of your first shower and do not  sleep with pets. Day of Vargas : Do not apply any lotions/deodorants the morning of Vargas.  Please wear clean clothes to the hospital/Vargas center.  FAILURE TO FOLLOW THESE INSTRUCTIONS MAY RESULT IN THE CANCELLATION OF YOUR Vargas PATIENT SIGNATURE_________________________________  NURSE SIGNATURE__________________________________  ________________________________________________________________________   [facility]  An incentive spirometer is a tool that can help keep your lungs clear and active. This tool measures how well you are filling your lungs with each breath. Taking long deep breaths may help reverse or decrease the chance of developing breathing  (pulmonary) problems (especially infection) following:  A long period of time when you are unable to move or be active. BEFORE THE PROCEDURE   If the spirometer includes an indicator to show your best effort, your nurse or respiratory therapist will set it to a desired goal.  If possible, sit up straight or lean slightly forward. Try not to slouch.  Hold the incentive spirometer in an upright position. INSTRUCTIONS FOR USE   Sit on the edge of your bed if possible, or sit up as far as you can in bed or on a chair.  Hold the incentive spirometer in an upright position.  Breathe out normally.  Place the mouthpiece in your mouth and seal your lips tightly around it.  Breathe in slowly and as deeply as possible, raising the piston or the ball toward the top of the column.  Hold your breath for 3-5 seconds or for as long as possible. Allow the piston or ball to fall to the bottom of the column.  Remove the mouthpiece from your mouth and breathe out normally.  Rest for a few seconds and repeat Steps 1 through 7 at least 10 times every 1-2 hours when you are awake. Take your time and take a few normal breaths between deep breaths.  The spirometer may include an indicator to show your best effort. Use the indicator as a goal to work toward during each repetition.  After each set of 10 deep breaths, practice coughing to be sure your lungs are clear. If you have an incision (the cut made at the time of Vargas), support your incision when coughing by placing a pillow or rolled up towels firmly against it. Once you are able to  get out of bed, walk around indoors and cough well. You may stop using the incentive spirometer when instructed by your caregiver.  RISKS AND COMPLICATIONS  Take your time so you do not get dizzy or light-headed.  If you are in pain, you may need to take or ask for pain medication before doing incentive spirometry. It is harder to take a deep breath if you are having  pain. AFTER USE  Rest and breathe slowly and easily.  It can be helpful to keep track of a log of your progress. Your caregiver can provide you with a simple table to help with this. If you are using the spirometer at home, follow these instructions: [facility] IF:   You are having difficultly using the spirometer.  You have trouble using the spirometer as often as instructed.  Your pain medication is not giving enough relief while using the spirometer.  You develop fever of 100.5 F (38.1 C) or higher. SEEK IMMEDIATE MEDICAL CARE IF:   You cough up bloody sputum that had not been present before.  You develop fever of 102 F (38.9 C) or greater.  You develop worsening pain at or near the incision site. MAKE SURE YOU:   Understand these instructions.  Will watch your condition.  Will get help right away if you are not doing well or get worse. Document Released: 12/01/2006 Document Revised: 10/13/2011 Document Reviewed: 02/01/2007 ExitCare Patient Information 2014 Exit[employer], [place].   ________________________________________________________________________  WHAT IS A BLOOD TRANSFUSION? Blood Transfusion Information  A transfusion is the replacement of blood or some of its parts. Blood is made up of multiple cells which provide different functions.  Red blood cells carry oxygen and are used for blood loss replacement.  White blood cells fight against infection.  Platelets control bleeding.  Plasma helps clot blood.  Other blood products are available for specialized needs, such as hemophilia or other clotting disorders. BEFORE THE TRANSFUSION  Who gives blood for transfusions?   Healthy volunteers who are fully evaluated to make sure their blood is safe. This is blood bank blood. Transfusion therapy is the safest it has ever been in the practice of medicine. Before blood is taken from a donor, a complete history is taken to make sure that person has no history  of diseases nor engages in risky social behavior (examples are intravenous drug use or sexual activity with multiple partners). The donor's travel history is screened to minimize risk of transmitting infections, such as malaria. The donated blood is tested for signs of infectious diseases, such as HIV and hepatitis. The blood is then tested to be sure it is compatible with you in order to minimize the chance of a transfusion reaction. If you or a relative donates blood, this is often done in anticipation of Vargas and is not appropriate for emergency situations. It takes many days to process the donated blood. RISKS AND COMPLICATIONS Although transfusion therapy is very safe and saves many lives, the main dangers of transfusion include:   Getting an infectious disease.  Developing a transfusion reaction. This is an allergic reaction to something in the blood you were given. Every precaution is taken to prevent this. The decision to have a blood transfusion has been considered carefully by your caregiver before blood is given. Blood is not given unless the benefits outweigh the risks. AFTER THE TRANSFUSION  Right after receiving a blood transfusion, you will usually feel much better and more energetic. This is especially true if  your red blood cells have gotten low (anemic). The transfusion raises the level of the red blood cells which carry oxygen, and this usually causes an energy increase.  The nurse administering the transfusion will monitor you carefully for complications. HOME CARE INSTRUCTIONS  No special instructions are needed after a transfusion. You may find your energy is better. Speak with your caregiver about any limitations on activity for underlying diseases you may have. SEEK MEDICAL CARE IF:   Your condition is not improving after your transfusion.  You develop redness or irritation at the intravenous (IV) site. SEEK IMMEDIATE MEDICAL CARE IF:  Any of the following symptoms  occur over the next 12 hours:  Shaking chills.  You have a temperature by mouth above 102 F (38.9 C), not controlled by medicine.  Chest, back, or muscle pain.  People around you feel you are not acting correctly or are confused.  Shortness of breath or difficulty breathing.  Dizziness and fainting.  You get a rash or develop hives.  You have a decrease in urine output.  Your urine turns a dark color or changes to pink, red, or brown. Any of the following symptoms occur over the next 10 days:  You have a temperature by mouth above 102 F (38.9 C), not controlled by medicine.  Shortness of breath.  Weakness after normal activity.  The white part of the eye turns yellow (jaundice).  You have a decrease in the amount of urine or are urinating less often.  Your urine turns a dark color or changes to pink, red, or brown. Document Released: 07/18/2000 Document Revised: 10/13/2011 Document Reviewed: 03/06/2008 [facility] Patient Information 2014 [place], [place].  _______________________________________________________________________

## 2015-07-17 ENCOUNTER — Ambulatory Visit: Payer: Self-pay | Admitting: Orthopedic Surgery

## 2015-07-17 NOTE — H&P (Signed)
Garrett Vargas Garrett Vargas DOB: 08-10-1945 Married / Language: English / Race: White Male Date of Admission:  07/18/2015 CC:  Left Hip Pain History of Present Illness The patient is a 69 year old male who comes in for a preoperative History and Physical. The patient is scheduled for a left total hip arthroplasty (anterior) to be performed by Dr. Dione Plover. Aluisio, Garrett Vargas at [facility] on 07-25-2015. The patient reports left hip problems including pain symptoms that have been present for year(s) (and 1/2). The symptoms began without any known injury. Symptoms reported include pain with weightbearing (at times) The patient reports symptoms radiating to the: left groin. Onset of symptoms was gradual.The symptoms are described as moderate in severity.The patient feels as if their symptoms are does feel they are worsening (past month). Current treatment includes nonsteroidal anti-inflammatory drugs (Ibuprofen prn). [name] states that the left hip has been giving him increasing problems over the past few months. Pain is mainly in the groin radiating down the anterior thigh. He has lost some motion in the left hip. His right hip is doing fine. His left knee is doing fine. They have been treated conservatively in the past for the above stated problem and despite conservative measures, they continue to have progressive pain and severe functional limitations and dysfunction. They have failed non-operative management including home exercise, medications. It is felt that they would benefit from undergoing total joint replacement. Risks and benefits of the procedure have been discussed with the patient and they elect to proceed with surgery. There are no active contraindications to surgery such as ongoing infection or rapidly progressive neurological disease.  Problem List/Past Medical Status post total left knee replacement [id number]  Primary osteoarthritis of left hip (M16.12)  Hypertension  Hypercholesterolemia   Atrial Fibrillation  Childhood Illness: Measels  Mumps  Childhood Illness Osteoarthrosis NOS, lower leg (715.96) 04/16/1995 Tear, lateral meniscus, knee, current (836.1) 11/19/1999 Dislocation closed interphalangeal hand (834.02) 12/19/2005  Allergies No Known Drug Allergies   Family History Father  Deceased. Prostate; Age 3 Mother  Deceased, Heart disease. Age 23  Social History Tobacco use  Former smoker. Current work status  Retired. Oral Surgeon Alcohol use  Currently drinks alcohol. 1-2 drinks daily Marital status  Married. Living situation  Lives with spouse. Post-Surgical Plans  Home Advance Directives  Living Will, Healthcare POA  Medication History Amoxicillin (500MG  Tablet, 4 Oral 1-2 hour prior to dental work, Taken starting 07/08/2011) Active. Aspirin Childrens (81MG  Tablet Chewable, Oral) Active. Fish Oil Active. Multi Vitamin Daily (Oral) Active. Atenolol (25MG  Tablet, Oral) Active. Atorvastatin Calcium (20MG  Tablet, Oral) Active. Niacin ER (Antihyperlipidemic) (1000MG  Tablet ER, Oral) Active.  Past Surgical History Left Elbow Surgery  Two times. Right Elbow Surgery  Left Knee Surgery  Three times. Cardioversion  Date: 03/2014.  Review of Systems General Not Present- Chills, Fatigue, Fever, Memory Loss, Night Sweats, Weight Gain and Weight Loss. Skin Not Present- Eczema, Hives, Itching, Lesions and Rash. HEENT Not Present- Dentures, Double Vision, Headache, Hearing Loss, Tinnitus and Visual Loss. Respiratory Not Present- Allergies, Chronic Cough, Coughing up blood, Shortness of breath at rest and Shortness of breath with exertion. Cardiovascular Not Present- Chest Pain, Difficulty Breathing Lying Down, Murmur, Palpitations, Racing/skipping heartbeats and Swelling. Gastrointestinal Not Present- Abdominal Pain, Bloody Stool, Constipation, Diarrhea, Difficulty Swallowing, Heartburn, Jaundice, Loss of appetitie, Nausea and  Vomiting. Male Genitourinary Present- Urinating at Night. Not Present- Blood in Urine, Discharge, Flank Pain, Incontinence, Painful Urination, Urgency, Urinary frequency, Urinary Retention and Weak urinary stream.  Musculoskeletal Present- Joint Pain. Not Present- Back Pain, Joint Swelling, Morning Stiffness, Muscle Pain, Muscle Weakness and Spasms. Neurological Not Present- Blackout spells, Difficulty with balance, Dizziness, Paralysis, Tremor and Weakness. Psychiatric Not Present- Insomnia.  Vitals Weight: 180 lb Height: 74in Body Surface Area: 2.08 m Body Mass Index: 23.11 kg/m  BP: 118/70 (Sitting, Left Arm, Standard)   Physical Exam General Mental Status -Alert, cooperative and good historian. General Appearance-pleasant, Not in acute distress. Orientation-Oriented X3. Build & Nutrition-Well nourished and Well developed.  Head and Neck Head-normocephalic, atraumatic . Neck Global Assessment - supple, no bruit auscultated on the right, no bruit auscultated on the left.  Eye Vision-Wears corrective lenses. Pupil - Bilateral-Regular and Round. Motion - Bilateral-EOMI.  Chest and Lung Exam Auscultation Breath sounds - clear at anterior chest wall and clear at posterior chest wall. Adventitious sounds - No Adventitious sounds.  Cardiovascular Auscultation Rhythm - Regular rate and rhythm. Heart Sounds - S1 WNL and S2 WNL. Murmurs & Other Heart Sounds - Auscultation of the heart reveals - No Murmurs.  Abdomen Palpation/Percussion Tenderness - Abdomen is non-tender to palpation. Rigidity (guarding) - Abdomen is soft. Auscultation Auscultation of the abdomen reveals - Bowel sounds normal.  Male Genitourinary Note: Not done, not pertinent to present illness   Musculoskeletal Note: A well-developed male, alert and oriented, in no apparent distress. His left knee looks great. There is no swelling. It ranges 0-130 with no tenderness or instability.  Left hip flexion about 100, minimal internal rotation, about 20 external rotation, 20 abduction. Right hip has normal motion.  RADIOGRAPHS AP pelvis and lateral of the left hip show bone-on-bone arthritis of the left hip. The arthritis has progressed compared to x-rays 2 years ago. AP and lateral of the left knee show his prosthesis in excellent position with no periprosthetic abnormalities. Also had an AP of the right knee and that is normal.  Assessment & Plan Primary osteoarthritis of left hip (M16.12) Status post total left knee replacement [id number])  Note:Surgical Plans: Left Total Hip Replacement - Anterior Approach  Disposition:   PCP: Dr. Sherren Mocha Cards: Dr. Lovena Le - Patient has been seen preoperatively and felt to be stable for surgery. "low risk"  IV TXA  Anesthesia Issues: None  Signed electronically by Joelene Millin, III PA-C

## 2015-07-18 ENCOUNTER — Encounter (HOSPITAL_COMMUNITY): Payer: Self-pay | Admitting: *Deleted

## 2015-07-18 ENCOUNTER — Inpatient Hospital Stay (HOSPITAL_COMMUNITY): Payer: Medicare Other | Admitting: Anesthesiology

## 2015-07-18 ENCOUNTER — Inpatient Hospital Stay (HOSPITAL_COMMUNITY)
Admission: RE | Admit: 2015-07-18 | Discharge: 2015-07-19 | DRG: 470 | Disposition: A | Discharge status: Home Health | Payer: Medicare Other | Source: Ambulatory Visit | Attending: Orthopedic Surgery | Admitting: Orthopedic Surgery

## 2015-07-18 ENCOUNTER — Inpatient Hospital Stay (HOSPITAL_COMMUNITY): Payer: Medicare Other

## 2015-07-18 ENCOUNTER — Encounter (HOSPITAL_COMMUNITY)
Admission: RE | Disposition: A | Discharge status: Home Health | Payer: Self-pay | Source: Ambulatory Visit | Attending: Orthopedic Surgery

## 2015-07-18 DIAGNOSIS — I4891 Unspecified atrial fibrillation: Secondary | ICD-10-CM | POA: Diagnosis present

## 2015-07-18 DIAGNOSIS — Z01812 Encounter for preprocedural laboratory examination: Secondary | ICD-10-CM | POA: Diagnosis not present

## 2015-07-18 DIAGNOSIS — Z87891 Personal history of nicotine dependence: Secondary | ICD-10-CM

## 2015-07-18 DIAGNOSIS — M1612 Unilateral primary osteoarthritis, left hip: Secondary | ICD-10-CM | POA: Diagnosis present

## 2015-07-18 DIAGNOSIS — M169 Osteoarthritis of hip, unspecified: Secondary | ICD-10-CM | POA: Diagnosis present

## 2015-07-18 DIAGNOSIS — I10 Essential (primary) hypertension: Secondary | ICD-10-CM | POA: Diagnosis present

## 2015-07-18 DIAGNOSIS — Z7982 Long term (current) use of aspirin: Secondary | ICD-10-CM | POA: Diagnosis not present

## 2015-07-18 DIAGNOSIS — Z96642 Presence of left artificial hip joint: Secondary | ICD-10-CM | POA: Diagnosis not present

## 2015-07-18 DIAGNOSIS — Z79899 Other long term (current) drug therapy: Secondary | ICD-10-CM | POA: Diagnosis not present

## 2015-07-18 DIAGNOSIS — Z471 Aftercare following joint replacement surgery: Secondary | ICD-10-CM | POA: Diagnosis not present

## 2015-07-18 DIAGNOSIS — M25552 Pain in left hip: Secondary | ICD-10-CM | POA: Diagnosis not present

## 2015-07-18 DIAGNOSIS — Z96649 Presence of unspecified artificial hip joint: Secondary | ICD-10-CM

## 2015-07-18 HISTORY — PX: TOTAL HIP ARTHROPLASTY: SHX124

## 2015-07-18 LAB — TYPE AND SCREEN
ABO/RH(D): O POS
ANTIBODY SCREEN: NEGATIVE

## 2015-07-18 SURGERY — ARTHROPLASTY, HIP, TOTAL, ANTERIOR APPROACH
Anesthesia: Spinal | Site: Hip | Laterality: Left

## 2015-07-18 MED ORDER — TRANEXAMIC ACID 1000 MG/10ML IV SOLN
1000.0000 mg | INTRAVENOUS | Status: AC
  Administered 2015-07-18: 1000 mg via INTRAVENOUS
  Filled 2015-07-18: qty 10

## 2015-07-18 MED ORDER — MIDAZOLAM HCL 5 MG/5ML IJ SOLN
INTRAMUSCULAR | Status: DC | PRN
  Administered 2015-07-18: 2 mg via INTRAVENOUS

## 2015-07-18 MED ORDER — CEFAZOLIN SODIUM-DEXTROSE 2-3 GM-% IV SOLR
2.0000 g | Freq: Four times a day (QID) | INTRAVENOUS | Status: AC
  Administered 2015-07-18 (×2): 2 g via INTRAVENOUS
  Filled 2015-07-18 (×2): qty 50

## 2015-07-18 MED ORDER — SODIUM CHLORIDE 0.9 % IV SOLN: INTRAVENOUS | Status: DC

## 2015-07-18 MED ORDER — PROPOFOL 10 MG/ML IV BOLUS
INTRAVENOUS | Status: AC
  Filled 2015-07-18: qty 40

## 2015-07-18 MED ORDER — BUPIVACAINE HCL (PF) 0.25 % IJ SOLN
INTRAMUSCULAR | Status: DC | PRN
  Administered 2015-07-18: 30 mL

## 2015-07-18 MED ORDER — ONDANSETRON HCL 4 MG/2ML IJ SOLN: 4.0000 mg | Freq: Four times a day (QID) | INTRAMUSCULAR | Status: DC | PRN

## 2015-07-18 MED ORDER — BUPIVACAINE HCL (PF) 0.5 % IJ SOLN
INTRAMUSCULAR | Status: DC | PRN
  Administered 2015-07-18: 15 mg via INTRATHECAL

## 2015-07-18 MED ORDER — METOCLOPRAMIDE HCL 5 MG/ML IJ SOLN: 5.0000 mg | Freq: Three times a day (TID) | INTRAMUSCULAR | Status: DC | PRN

## 2015-07-18 MED ORDER — LACTATED RINGERS IV BOLUS (SEPSIS)
500.0000 mL | Freq: Once | INTRAVENOUS | Status: AC
  Administered 2015-07-18: 500 mL via INTRAVENOUS

## 2015-07-18 MED ORDER — METOCLOPRAMIDE HCL 10 MG PO TABS: 5.0000 mg | ORAL_TABLET | Freq: Three times a day (TID) | ORAL | Status: DC | PRN

## 2015-07-18 MED ORDER — CEFAZOLIN SODIUM-DEXTROSE 2-3 GM-% IV SOLR
2.0000 g | INTRAVENOUS | Status: AC
  Administered 2015-07-18: 2 g via INTRAVENOUS

## 2015-07-18 MED ORDER — TRAMADOL HCL 50 MG PO TABS
50.0000 mg | ORAL_TABLET | Freq: Four times a day (QID) | ORAL | Status: DC | PRN
  Administered 2015-07-19: 100 mg via ORAL
  Filled 2015-07-18: qty 2

## 2015-07-18 MED ORDER — ATENOLOL 12.5 MG HALF TABLET
12.5000 mg | ORAL_TABLET | Freq: Every day | ORAL | Status: DC
  Administered 2015-07-19: 12.5 mg via ORAL
  Filled 2015-07-18: qty 1

## 2015-07-18 MED ORDER — PROMETHAZINE HCL 25 MG/ML IJ SOLN: 6.2500 mg | INTRAMUSCULAR | Status: DC | PRN

## 2015-07-18 MED ORDER — PHENYLEPHRINE HCL 10 MG/ML IJ SOLN
INTRAMUSCULAR | Status: DC | PRN
  Administered 2015-07-18 (×3): 80 ug via INTRAVENOUS

## 2015-07-18 MED ORDER — SODIUM CHLORIDE 0.9 % IV SOLN
10.0000 mg | INTRAVENOUS | Status: DC | PRN
  Administered 2015-07-18: 25 ug/min via INTRAVENOUS

## 2015-07-18 MED ORDER — DEXAMETHASONE SODIUM PHOSPHATE 10 MG/ML IJ SOLN
INTRAMUSCULAR | Status: AC
  Filled 2015-07-18: qty 1

## 2015-07-18 MED ORDER — ONDANSETRON HCL 4 MG PO TABS: 4.0000 mg | ORAL_TABLET | Freq: Four times a day (QID) | ORAL | Status: DC | PRN

## 2015-07-18 MED ORDER — FENTANYL CITRATE (PF) 100 MCG/2ML IJ SOLN
INTRAMUSCULAR | Status: DC | PRN
  Administered 2015-07-18: 100 ug via INTRAVENOUS

## 2015-07-18 MED ORDER — ACETAMINOPHEN 10 MG/ML IV SOLN
INTRAVENOUS | Status: AC
  Filled 2015-07-18: qty 100

## 2015-07-18 MED ORDER — ATORVASTATIN CALCIUM 20 MG PO TABS
20.0000 mg | ORAL_TABLET | Freq: Every day | ORAL | Status: DC
  Administered 2015-07-18: 20 mg via ORAL
  Filled 2015-07-18 (×2): qty 1

## 2015-07-18 MED ORDER — NIACIN ER (ANTIHYPERLIPIDEMIC) 500 MG PO TBCR
1000.0000 mg | EXTENDED_RELEASE_TABLET | Freq: Every day | ORAL | Status: DC
  Administered 2015-07-18: 1000 mg via ORAL
  Filled 2015-07-18 (×2): qty 2

## 2015-07-18 MED ORDER — POLYETHYLENE GLYCOL 3350 17 G PO PACK: 17.0000 g | PACK | Freq: Every day | ORAL | Status: DC | PRN

## 2015-07-18 MED ORDER — DEXAMETHASONE SODIUM PHOSPHATE 10 MG/ML IJ SOLN: 10.0000 mg | Freq: Once | INTRAMUSCULAR | Status: DC

## 2015-07-18 MED ORDER — MEPERIDINE HCL 50 MG/ML IJ SOLN: 6.2500 mg | INTRAMUSCULAR | Status: DC | PRN

## 2015-07-18 MED ORDER — PROPOFOL 10 MG/ML IV BOLUS
INTRAVENOUS | Status: AC
  Filled 2015-07-18: qty 20

## 2015-07-18 MED ORDER — ACETAMINOPHEN 500 MG PO TABS
1000.0000 mg | ORAL_TABLET | Freq: Four times a day (QID) | ORAL | Status: AC
  Administered 2015-07-18 – 2015-07-19 (×4): 1000 mg via ORAL
  Filled 2015-07-18 (×4): qty 2

## 2015-07-18 MED ORDER — 0.9 % SODIUM CHLORIDE (POUR BTL) OPTIME
TOPICAL | Status: DC | PRN
  Administered 2015-07-18: 1000 mL

## 2015-07-18 MED ORDER — LACTATED RINGERS IV SOLN
INTRAVENOUS | Status: DC
  Administered 2015-07-18: 11:00:00 via INTRAVENOUS

## 2015-07-18 MED ORDER — FENTANYL CITRATE (PF) 100 MCG/2ML IJ SOLN: 25.0000 ug | INTRAMUSCULAR | Status: DC | PRN

## 2015-07-18 MED ORDER — CEFAZOLIN SODIUM-DEXTROSE 2-3 GM-% IV SOLR
INTRAVENOUS | Status: AC
  Filled 2015-07-18: qty 50

## 2015-07-18 MED ORDER — MIDAZOLAM HCL 2 MG/2ML IJ SOLN
INTRAMUSCULAR | Status: AC
  Filled 2015-07-18: qty 2

## 2015-07-18 MED ORDER — SODIUM CHLORIDE 0.9 % IV SOLN
INTRAVENOUS | Status: DC
  Administered 2015-07-18 – 2015-07-19 (×2): via INTRAVENOUS

## 2015-07-18 MED ORDER — PHENYLEPHRINE 40 MCG/ML (10ML) SYRINGE FOR IV PUSH (FOR BLOOD PRESSURE SUPPORT)
PREFILLED_SYRINGE | INTRAVENOUS | Status: AC
  Filled 2015-07-18: qty 10

## 2015-07-18 MED ORDER — BISACODYL 10 MG RE SUPP: 10.0000 mg | Freq: Every day | RECTAL | Status: DC | PRN

## 2015-07-18 MED ORDER — FENTANYL CITRATE (PF) 100 MCG/2ML IJ SOLN
INTRAMUSCULAR | Status: AC
  Filled 2015-07-18: qty 2

## 2015-07-18 MED ORDER — LACTATED RINGERS IV SOLN
INTRAVENOUS | Status: DC | PRN
  Administered 2015-07-18 (×2): via INTRAVENOUS

## 2015-07-18 MED ORDER — PROPOFOL 500 MG/50ML IV EMUL
INTRAVENOUS | Status: DC | PRN
  Administered 2015-07-18: 100 ug/kg/min via INTRAVENOUS

## 2015-07-18 MED ORDER — DIPHENHYDRAMINE HCL 12.5 MG/5ML PO ELIX: 12.5000 mg | ORAL_SOLUTION | ORAL | Status: DC | PRN

## 2015-07-18 MED ORDER — ACETAMINOPHEN 650 MG RE SUPP: 650.0000 mg | Freq: Four times a day (QID) | RECTAL | Status: DC | PRN

## 2015-07-18 MED ORDER — MORPHINE SULFATE (PF) 2 MG/ML IV SOLN: 1.0000 mg | INTRAVENOUS | Status: DC | PRN

## 2015-07-18 MED ORDER — BUPIVACAINE HCL (PF) 0.25 % IJ SOLN
INTRAMUSCULAR | Status: AC
  Filled 2015-07-18: qty 30

## 2015-07-18 MED ORDER — ACETAMINOPHEN 325 MG PO TABS: 650.0000 mg | ORAL_TABLET | Freq: Four times a day (QID) | ORAL | Status: DC | PRN

## 2015-07-18 MED ORDER — DOCUSATE SODIUM 100 MG PO CAPS
100.0000 mg | ORAL_CAPSULE | Freq: Two times a day (BID) | ORAL | Status: DC
Start: 2015-07-18 — End: 2015-07-19
  Administered 2015-07-18 – 2015-07-19 (×2): 100 mg via ORAL

## 2015-07-18 MED ORDER — ACETAMINOPHEN 10 MG/ML IV SOLN
1000.0000 mg | Freq: Once | INTRAVENOUS | Status: AC
  Administered 2015-07-18: 1000 mg via INTRAVENOUS

## 2015-07-18 MED ORDER — OXYCODONE HCL 5 MG PO TABS
5.0000 mg | ORAL_TABLET | ORAL | Status: DC | PRN
  Administered 2015-07-18 – 2015-07-19 (×4): 5 mg via ORAL
  Administered 2015-07-19: 10 mg via ORAL
  Filled 2015-07-18 (×2): qty 1
  Filled 2015-07-18: qty 2
  Filled 2015-07-18 (×2): qty 1

## 2015-07-18 MED ORDER — MENTHOL 3 MG MT LOZG: 1.0000 | LOZENGE | OROMUCOSAL | Status: DC | PRN

## 2015-07-18 MED ORDER — METHOCARBAMOL 500 MG PO TABS
500.0000 mg | ORAL_TABLET | Freq: Four times a day (QID) | ORAL | Status: DC | PRN
  Administered 2015-07-19: 500 mg via ORAL
  Filled 2015-07-18: qty 1

## 2015-07-18 MED ORDER — METHOCARBAMOL 1000 MG/10ML IJ SOLN
500.0000 mg | Freq: Four times a day (QID) | INTRAVENOUS | Status: DC | PRN
  Filled 2015-07-18: qty 5

## 2015-07-18 MED ORDER — ONDANSETRON HCL 4 MG/2ML IJ SOLN
INTRAMUSCULAR | Status: AC
  Filled 2015-07-18: qty 2

## 2015-07-18 MED ORDER — PHENOL 1.4 % MT LIQD: 1.0000 | OROMUCOSAL | Status: DC | PRN

## 2015-07-18 MED ORDER — KETOROLAC TROMETHAMINE 15 MG/ML IJ SOLN: 7.5000 mg | Freq: Four times a day (QID) | INTRAMUSCULAR | Status: DC | PRN

## 2015-07-18 MED ORDER — DEXAMETHASONE SODIUM PHOSPHATE 10 MG/ML IJ SOLN
10.0000 mg | Freq: Once | INTRAMUSCULAR | Status: AC
  Administered 2015-07-19: 10 mg via INTRAVENOUS
  Filled 2015-07-18 (×2): qty 1

## 2015-07-18 MED ORDER — FLEET ENEMA 7-19 GM/118ML RE ENEM: 1.0000 | ENEMA | Freq: Once | RECTAL | Status: DC | PRN

## 2015-07-18 MED ORDER — CHLORHEXIDINE GLUCONATE 4 % EX LIQD: 60.0000 mL | Freq: Once | CUTANEOUS | Status: DC

## 2015-07-18 MED ORDER — RIVAROXABAN 10 MG PO TABS
10.0000 mg | ORAL_TABLET | Freq: Every day | ORAL | Status: DC
  Administered 2015-07-19: 10 mg via ORAL
  Filled 2015-07-18 (×2): qty 1

## 2015-07-18 SURGICAL SUPPLY — 33 items
BAG DECANTER FOR FLEXI CONT (MISCELLANEOUS) ×2 IMPLANT
BAG SPEC THK2 15X12 ZIP CLS (MISCELLANEOUS)
BAG ZIPLOCK 12X15 (MISCELLANEOUS) IMPLANT
BLADE SAG 18X100X1.27 (BLADE) ×2 IMPLANT
CAPT HIP TOTAL 2 ×1 IMPLANT
CLOTH BEACON ORANGE TIMEOUT ST (SAFETY) ×2 IMPLANT
COVER PERINEAL POST (MISCELLANEOUS) ×2 IMPLANT
DECANTER SPIKE VIAL GLASS SM (MISCELLANEOUS) ×2 IMPLANT
DRAPE STERI IOBAN 125X83 (DRAPES) ×2 IMPLANT
DRAPE U-SHAPE 47X51 STRL (DRAPES) ×4 IMPLANT
DRSG ADAPTIC 3X8 NADH LF (GAUZE/BANDAGES/DRESSINGS) ×2 IMPLANT
DRSG MEPILEX BORDER 4X4 (GAUZE/BANDAGES/DRESSINGS) ×2 IMPLANT
DRSG MEPILEX BORDER 4X8 (GAUZE/BANDAGES/DRESSINGS) ×2 IMPLANT
DURAPREP 26ML APPLICATOR (WOUND CARE) ×2 IMPLANT
ELECT REM PT RETURN 9FT ADLT (ELECTROSURGICAL) ×2
ELECTRODE REM PT RTRN 9FT ADLT (ELECTROSURGICAL) ×1 IMPLANT
EVACUATOR 1/8 PVC DRAIN (DRAIN) ×2 IMPLANT
GLOVE BIO SURGEON STRL SZ7.5 (GLOVE) ×2 IMPLANT
GLOVE BIO SURGEON STRL SZ8 (GLOVE) ×4 IMPLANT
GLOVE BIOGEL PI IND STRL 8 (GLOVE) ×2 IMPLANT
GLOVE BIOGEL PI INDICATOR 8 (GLOVE) ×2
GOWN STRL REUS W/TWL LRG LVL3 (GOWN DISPOSABLE) ×2 IMPLANT
GOWN STRL REUS W/TWL XL LVL3 (GOWN DISPOSABLE) ×2 IMPLANT
PACK ANTERIOR HIP CUSTOM (KITS) ×2 IMPLANT
STRIP CLOSURE SKIN 1/2X4 (GAUZE/BANDAGES/DRESSINGS) ×2 IMPLANT
SUT ETHIBOND NAB CT1 #1 30IN (SUTURE) ×2 IMPLANT
SUT MNCRL AB 4-0 PS2 18 (SUTURE) ×2 IMPLANT
SUT VIC AB 2-0 CT1 27 (SUTURE) ×4
SUT VIC AB 2-0 CT1 TAPERPNT 27 (SUTURE) ×2 IMPLANT
SUT VLOC 180 0 24IN GS25 (SUTURE) ×2 IMPLANT
SYR 50ML LL SCALE MARK (SYRINGE) IMPLANT
TRAY FOLEY W/METER SILVER 16FR (SET/KITS/TRAYS/PACK) ×2 IMPLANT
YANKAUER SUCT BULB TIP 10FT TU (MISCELLANEOUS) ×2 IMPLANT

## 2015-07-18 NOTE — Progress Notes (Signed)
Dr. Marcell Barlow made aware of patient's blood pressures- patient to receive 500 cc LR IVB  Rapidly in PACU

## 2015-07-18 NOTE — Anesthesia Procedure Notes (Signed)
Spinal Patient location during procedure: OR Start time: 07/18/2015 8:23 AM End time: 07/18/2015 8:25 AM Staffing Resident/CRNA: Harle Stanford R Performed by: resident/CRNA  Preanesthetic Checklist Completed: patient identified, site marked, surgical consent, pre-op evaluation, timeout performed, IV checked, risks and benefits discussed and monitors and equipment checked Spinal Block Patient position: sitting Prep: Betadine Patient monitoring: heart rate, cardiac monitor, continuous pulse ox and blood pressure Approach: midline Location: L3-4 Needle Needle type: Sprotte  Needle gauge: 24 G Needle length: 10 cm Needle insertion depth: 7 cm Assessment Sensory level: T6 Additional Notes Timeout performed. Spinal kit date checked.

## 2015-07-18 NOTE — Progress Notes (Signed)
X-RAY results noted 

## 2015-07-18 NOTE — Interval H&P Note (Signed)
History and Physical Interval Note:  07/18/2015 8:05 AM  Garrett Vargas  has presented today for surgery, with the diagnosis of left hip osteoarthitis  The various methods of treatment have been discussed with the patient and family. After consideration of risks, benefits and other options for treatment, the patient has consented to  Procedure(s): LEFT TOTAL HIP ARTHROPLASTY ANTERIOR APPROACH (Left) as a surgical intervention .  The patient's history has been reviewed, patient examined, no change in status, stable for surgery.  I have reviewed the patient's chart and labs.  Questions were answered to the patient's satisfaction.     Gearlean Alf

## 2015-07-18 NOTE — Anesthesia Preprocedure Evaluation (Addendum)
Anesthesia Evaluation  Patient identified by MRN, date of birth, ID band Patient awake    Reviewed: Allergy & Precautions, NPO status , Patient's Chart, lab work & pertinent test results  Airway Mallampati: II  TM Distance: >3 FB Neck ROM: Full    Dental no notable dental hx. (+) Caps   Pulmonary former smoker,    Pulmonary exam normal breath sounds clear to auscultation       Cardiovascular hypertension, Pt. on medications Normal cardiovascular exam+ dysrhythmias Atrial Fibrillation  Rhythm:Regular Rate:Normal     Neuro/Psych negative neurological ROS  negative psych ROS   GI/Hepatic negative GI ROS, Neg liver ROS,   Endo/Other  negative endocrine ROS  Renal/GU negative Renal ROS  negative genitourinary   Musculoskeletal negative musculoskeletal ROS (+)   Abdominal   Peds negative pediatric ROS (+)  Hematology negative hematology ROS (+)   Anesthesia Other Findings   Reproductive/Obstetrics negative OB ROS                            Anesthesia Physical Anesthesia Plan  ASA: II  Anesthesia Plan: Spinal   Post-op Pain Management:    Induction:   Airway Management Planned: Simple Face Mask  Additional Equipment:   Intra-op Plan:   Post-operative Plan:   Informed Consent: I have reviewed the patients History and Physical, chart, labs and discussed the procedure including the risks, benefits and alternatives for the proposed anesthesia with the patient or authorized representative who has indicated his/her understanding and acceptance.   Dental advisory given  Plan Discussed with: CRNA  Anesthesia Plan Comments:         Anesthesia Quick Evaluation

## 2015-07-18 NOTE — Progress Notes (Signed)
Dr. Barbarann Ehlers made aware of patient's spinal level- flicker of thigh muscles only- O.k. TO GO TO FLOOR- ORDERS GIVEN FOR FLOOR NURSE

## 2015-07-18 NOTE — Evaluation (Signed)
Physical Therapy Evaluation Patient Details Name: Garrett Vargas MRN: [id number] DOB: [birth year] Today's Date: 07/18/2015   History of Present Illness  LDATHA  Clinical Impression  Patient ambulated x 225' with Rw, pain is minimal./ Patient will benefit from PT to address problems listed in note below.    Follow Up Recommendations No PT follow up;Supervision/Assistance - 24 hour    Equipment Recommendations  Rolling walker with 5" wheels    Recommendations for Other Services       Precautions / Restrictions Precautions Precautions: Fall      Mobility  Bed Mobility Overal bed mobility: Needs Assistance Bed Mobility: Supine to Sit     Supine to sit: Min assist     General bed mobility comments: support the left leg  Transfers Overall transfer level: Needs assistance Equipment used: Rolling walker (2 wheeled) Transfers: Sit to/from Stand Sit to Stand: Min assist         General transfer comment: cues for hand  and Left leg position  Ambulation/Gait Ambulation/Gait assistance: Min guard Ambulation Distance (Feet): 225 Feet Assistive device: Rolling walker (2 wheeled) Gait Pattern/deviations: Step-through pattern     General Gait Details: smoothe gait pattern, no difficulties advancing the leg.  Stairs            Wheelchair Mobility    Modified Rankin (Stroke Patients Only)       Balance                                             Pertinent Vitals/Pain Pain Assessment: 0-10 Pain Score: 2  Pain Location: L thigh Pain Descriptors / Indicators: Discomfort Pain Intervention(s): Limited activity within patient's tolerance;Monitored during session;Premedicated before session;Repositioned;Ice applied    Home Living Family/patient expects to be discharged to:: Private residence Living Arrangements: Spouse/significant other Available Help at Discharge: Family Type of Home: House Home Access: Stairs to enter   Engineer, site of Steps: 3 Home Layout: Two level;Full bath on main level;Bed/bath upstairs Home Equipment: Shower seat - built in;Crutches;Cane - quad      Prior Function Level of Independence: Independent               Hand Dominance        Extremity/Trunk Assessment   Upper Extremity Assessment: Defer to OT evaluation           Lower Extremity Assessment: LLE deficits/detail   LLE Deficits / Details: advances the leg  Cervical / Trunk Assessment: Normal  Communication   Communication: No difficulties  Cognition Arousal/Alertness: Awake/alert Behavior During Therapy: WFL for tasks assessed/performed Overall Cognitive Status: Within Functional Limits for tasks assessed                      General Comments      Exercises        Assessment/Plan    PT Assessment Patient needs continued PT services  PT Diagnosis Difficulty walking   PT Problem List Decreased strength;Decreased range of motion;Decreased activity tolerance;Decreased mobility;Decreased coordination;Decreased cognition;Decreased knowledge of precautions;Decreased safety awareness;Decreased knowledge of use of DME  PT Treatment Interventions DME instruction;Gait training;Stair training;Functional mobility training;Therapeutic activities;Therapeutic exercise;Patient/family education   PT Goals (Current goals can be found in the Care Plan section) Acute Rehab PT Goals Patient Stated Goal: to go home PT Goal Formulation: With patient Time For Goal Achievement: 07/20/15 Potential to Achieve Goals:  Good    Frequency 7X/week   Barriers to discharge        Co-evaluation               End of Session   Activity Tolerance: Patient tolerated treatment well Patient left: in chair;with call bell/phone within reach;with chair alarm set Nurse Communication: Mobility status         Time: [id number] PT Time Calculation (min) (ACUTE ONLY): 34 min   Charges:   PT  Evaluation $Initial PT Evaluation Tier I: 1 Procedure PT Treatments $Gait Training: 8-22 mins   PT G Codes:        Claretha Cooper 07/18/2015, 5:26 PM Tresa Endo PT [phone]

## 2015-07-18 NOTE — Progress Notes (Signed)
Portable AP Pelvis X-ray done. 

## 2015-07-18 NOTE — Progress Notes (Signed)
Utilization review completed.  

## 2015-07-18 NOTE — Anesthesia Postprocedure Evaluation (Signed)
Anesthesia Post Note  Patient: Garrett Vargas  Procedure(s) Performed: Procedure(s) (LRB): LEFT TOTAL HIP ARTHROPLASTY ANTERIOR APPROACH (Left)  Patient location during evaluation: PACU Anesthesia Type: Spinal Level of consciousness: oriented and awake and alert Pain management: pain level controlled Vital Signs Assessment: post-procedure vital signs reviewed and stable Respiratory status: spontaneous breathing, respiratory function stable and patient connected to nasal cannula oxygen Cardiovascular status: blood pressure returned to baseline and stable Postop Assessment: no headache and no backache Anesthetic complications: no    Last Vitals:  Filed Vitals:   07/18/15 1145 07/18/15 1207  BP: 102/69 106/63  Pulse: 67 60  Temp: 36.4 C 36.5 C  Resp: 15 14    Last Pain:  Filed Vitals:   07/18/15 1208  PainSc: 0-No pain                 Montez Hageman

## 2015-07-18 NOTE — Transfer of Care (Signed)
Immediate Anesthesia Transfer of Care Note  Patient: Garrett Vargas  Procedure(s) Performed: Procedure(s): LEFT TOTAL HIP ARTHROPLASTY ANTERIOR APPROACH (Left)  Patient Location: PACU  Anesthesia Type:Spinal  Level of Consciousness: sedated  Airway & Oxygen Therapy: Patient Spontanous Breathing and Patient connected to face mask oxygen  Post-op Assessment: Report given to RN and Post -op Vital signs reviewed and stable  Post vital signs: Reviewed and stable  Last Vitals:  Filed Vitals:   07/18/15 0639  BP: 115/77  Pulse: 80  Temp: 36.4 C  Resp: 18    Complications: No apparent anesthesia complications

## 2015-07-18 NOTE — Op Note (Signed)
OPERATIVE REPORT  PREOPERATIVE DIAGNOSIS: Osteoarthritis of the Left hip.   POSTOPERATIVE DIAGNOSIS: Osteoarthritis of the Left  hip.   PROCEDURE: Left total hip arthroplasty, anterior approach.   SURGEON: Gaynelle Arabian, MD   ASSISTANT: Arlee Muslim, PA-C  ANESTHESIA:  Spinal  ESTIMATED BLOOD LOSS:-350 ml   DRAINS: Hemovac x1.   COMPLICATIONS: None   CONDITION: PACU - hemodynamically stable.   BRIEF CLINICAL NOTE: Garrett Vargas is a 69 y.o. male who has advanced end-  stage arthritis of his Left  hip with progressively worsening pain and  dysfunction.The patient has failed nonoperative management and presents for  total hip arthroplasty.   PROCEDURE IN DETAIL: After successful administration of spinal  anesthetic, the traction boots for the [facility] bed were placed on both  feet and the patient was placed onto the [facility] bed, boots placed into the leg  holders. The Left hip was then isolated from the perineum with plastic  drapes and prepped and draped in the usual sterile fashion. ASIS and  greater trochanter were marked and a oblique incision was made, starting  at about 1 cm lateral and 2 cm distal to the ASIS and coursing towards  the anterior cortex of the femur. The skin was cut with a 10 blade  through subcutaneous tissue to the level of the fascia overlying the  tensor fascia lata muscle. The fascia was then incised in line with the  incision at the junction of the anterior third and posterior 2/3rd. The  muscle was teased off the fascia and then the interval between the TFL  and the rectus was developed. The Hohmann retractor was then placed at  the top of the femoral neck over the capsule. The vessels overlying the  capsule were cauterized and the fat on top of the capsule was removed.  A Hohmann retractor was then placed anterior underneath the rectus  femoris to give exposure to the entire anterior capsule. A T-shaped  capsulotomy was performed. The  edges were tagged and the femoral head  was identified.       Osteophytes are removed off the superior acetabulum.  The femoral neck was then cut in situ with an oscillating saw. Traction  was then applied to the left lower extremity utilizing the [facility]  traction. The femoral head was then removed. Retractors were placed  around the acetabulum and then circumferential removal of the labrum was  performed. Osteophytes were also removed. Reaming starts at 47 mm to  medialize and  Increased in 2 mm increments to 53 mm. We reamed in  approximately 40 degrees of abduction, 20 degrees anteversion. A 54 mm  pinnacle acetabular shell was then impacted in anatomic position under  fluoroscopic guidance with excellent purchase. We did not need to place  any additional dome screws. A 36 mm neutral + 4 marathon liner was then  placed into the acetabular shell.       The femoral lift was then placed along the lateral aspect of the femur  just distal to the vastus ridge. The leg was  externally rotated and capsule  was stripped off the inferior aspect of the femoral neck down to the  level of the lesser trochanter, this was done with electrocautery. The femur was lifted after this was performed. The  leg was then placed and extended in adducted position to essentially delivering the femur. We also removed the capsule superiorly and the  piriformis from the piriformis  fossa to gain excellent exposure of the  proximal femur. Rongeur was used to remove some cancellous bone to get  into the lateral portion of the proximal femur for placement of the  initial starter reamer. The starter broaches was placed  the starter broach  and was shown to go down the center of the canal. Broaching  with the  Corail system was then performed starting at size 8, coursing  Up to size 13. A size 13 had excellent torsional and rotational  and axial stability. The trial standard offset neck was then placed  with a 36 + 1.5 trial  head. The hip was then reduced. We confirmed that  the stem was in the canal both on AP and lateral x-rays. It also has excellent sizing. The hip was reduced with outstanding stability through full extension, full external rotation,  and then flexion in adduction internal rotation. AP pelvis was taken  and the leg lengths were measured and found to be exactly equal. Hip  was then dislocated again and the femoral head and neck removed. The  femoral broach was removed. Size 13 Corail stem with a standard offset  neck was then impacted into the femur following native anteversion. Has  excellent purchase in the canal. Excellent torsional and rotational and  axial stability. It is confirmed to be in the canal on AP and lateral  fluoroscopic views. The 36 + 1.5 ceramic head was placed and the hip  reduced with outstanding stability. Again AP pelvis was taken and it  confirmed that the leg lengths were equal. The wound was then copiously  irrigated with saline solution and the capsule reattached and repaired  with Ethibond suture. 30 ml of .25% Bupivicaine injected into the capsule and into the edge of the tensor fascia lata as well as subcutaneous tissue. The fascia overlying the tensor fascia lata was  then closed with a running #1 V-Loc. Subcu was closed with interrupted  2-0 Vicryl and subcuticular running 4-0 Monocryl. Incision was cleaned  and dried. Steri-Strips and a bulky sterile dressing applied. Hemovac  drain was hooked to suction and then he was awakened and transported to  recovery in stable condition.        Please note that a surgical assistant was a medical necessity for this procedure to perform it in a safe and expeditious manner. Assistant was necessary to provide appropriate retraction of vital neurovascular structures and to prevent femoral fracture and allow for anatomic placement of the prosthesis.  Gaynelle Arabian, M.D.

## 2015-07-18 NOTE — H&P (View-Only) (Signed)
Garrett Sa MD DOB: 1946/07/18 Married / Language: English / Race: White Male Date of Admission:  07/18/2015 CC:  Left Hip Pain History of Present Illness The patient is a 69 year old male who comes in for a preoperative History and Physical. The patient is scheduled for a left total hip arthroplasty (anterior) to be performed by Dr. Dione Plover. Aluisio, MD at [facility] on 07-25-2015. The patient reports left hip problems including pain symptoms that have been present for year(s) (and 1/2). The symptoms began without any known injury. Symptoms reported include pain with weightbearing (at times) The patient reports symptoms radiating to the: left groin. Onset of symptoms was gradual.The symptoms are described as moderate in severity.The patient feels as if their symptoms are does feel they are worsening (past month). Current treatment includes nonsteroidal anti-inflammatory drugs (Ibuprofen prn). [name] states that the left hip has been giving him increasing problems over the past few months. Pain is mainly in the groin radiating down the anterior thigh. He has lost some motion in the left hip. His right hip is doing fine. His left knee is doing fine. They have been treated conservatively in the past for the above stated problem and despite conservative measures, they continue to have progressive pain and severe functional limitations and dysfunction. They have failed non-operative management including home exercise, medications. It is felt that they would benefit from undergoing total joint replacement. Risks and benefits of the procedure have been discussed with the patient and they elect to proceed with surgery. There are no active contraindications to surgery such as ongoing infection or rapidly progressive neurological disease.  Problem List/Past Medical Status post total left knee replacement [id number])  Primary osteoarthritis of left hip (M16.12)  Hypertension  Hypercholesterolemia   Atrial Fibrillation  Childhood Illness: Measels  Mumps  Childhood Illness Osteoarthrosis NOS, lower leg (715.96) 04/16/1995 Tear, lateral meniscus, knee, current (836.1) 11/19/1999 Dislocation closed interphalangeal hand (834.02) 12/19/2005  Allergies No Known Drug Allergies   Family History Father  Deceased. Prostate; Age 3 Mother  Deceased, Heart disease. Age 37  Social History Tobacco use  Former smoker. Current work status  Retired. Oral Surgeon Alcohol use  Currently drinks alcohol. 1-2 drinks daily Marital status  Married. Living situation  Lives with spouse. Post-Surgical Plans  Home Advance Directives  Living Will, Healthcare POA  Medication History Amoxicillin (500MG  Tablet, 4 Oral 1-2 hour prior to dental work, Taken starting 07/08/2011) Active. Aspirin Childrens (81MG  Tablet Chewable, Oral) Active. Fish Oil Active. Multi Vitamin Daily (Oral) Active. Atenolol (25MG  Tablet, Oral) Active. Atorvastatin Calcium (20MG  Tablet, Oral) Active. Niacin ER (Antihyperlipidemic) (1000MG  Tablet ER, Oral) Active.  Past Surgical History Left Elbow Surgery  Two times. Right Elbow Surgery  Left Knee Surgery  Three times. Cardioversion  Date: 03/2014.  Review of Systems General Not Present- Chills, Fatigue, Fever, Memory Loss, Night Sweats, Weight Gain and Weight Loss. Skin Not Present- Eczema, Hives, Itching, Lesions and Rash. HEENT Not Present- Dentures, Double Vision, Headache, Hearing Loss, Tinnitus and Visual Loss. Respiratory Not Present- Allergies, Chronic Cough, Coughing up blood, Shortness of breath at rest and Shortness of breath with exertion. Cardiovascular Not Present- Chest Pain, Difficulty Breathing Lying Down, Murmur, Palpitations, Racing/skipping heartbeats and Swelling. Gastrointestinal Not Present- Abdominal Pain, Bloody Stool, Constipation, Diarrhea, Difficulty Swallowing, Heartburn, Jaundice, Loss of appetitie, Nausea and  Vomiting. Male Genitourinary Present- Urinating at Night. Not Present- Blood in Urine, Discharge, Flank Pain, Incontinence, Painful Urination, Urgency, Urinary frequency, Urinary Retention and Weak urinary stream.  Musculoskeletal Present- Joint Pain. Not Present- Back Pain, Joint Swelling, Morning Stiffness, Muscle Pain, Muscle Weakness and Spasms. Neurological Not Present- Blackout spells, Difficulty with balance, Dizziness, Paralysis, Tremor and Weakness. Psychiatric Not Present- Insomnia.  Vitals Weight: 180 lb Height: 74in Body Surface Area: 2.08 m Body Mass Index: 23.11 kg/m  BP: 118/70 (Sitting, Left Arm, Standard)   Physical Exam General Mental Status -Alert, cooperative and good historian. General Appearance-pleasant, Not in acute distress. Orientation-Oriented X3. Build & Nutrition-Well nourished and Well developed.  Head and Neck Head-normocephalic, atraumatic . Neck Global Assessment - supple, no bruit auscultated on the right, no bruit auscultated on the left.  Eye Vision-Wears corrective lenses. Pupil - Bilateral-Regular and Round. Motion - Bilateral-EOMI.  Chest and Lung Exam Auscultation Breath sounds - clear at anterior chest wall and clear at posterior chest wall. Adventitious sounds - No Adventitious sounds.  Cardiovascular Auscultation Rhythm - Regular rate and rhythm. Heart Sounds - S1 WNL and S2 WNL. Murmurs & Other Heart Sounds - Auscultation of the heart reveals - No Murmurs.  Abdomen Palpation/Percussion Tenderness - Abdomen is non-tender to palpation. Rigidity (guarding) - Abdomen is soft. Auscultation Auscultation of the abdomen reveals - Bowel sounds normal.  Male Genitourinary Note: Not done, not pertinent to present illness   Musculoskeletal Note: A well-developed male, alert and oriented, in no apparent distress. His left knee looks great. There is no swelling. It ranges 0-130 with no tenderness or instability.  Left hip flexion about 100, minimal internal rotation, about 20 external rotation, 20 abduction. Right hip has normal motion.  RADIOGRAPHS AP pelvis and lateral of the left hip show bone-on-bone arthritis of the left hip. The arthritis has progressed compared to x-rays 2 years ago. AP and lateral of the left knee show his prosthesis in excellent position with no periprosthetic abnormalities. Also had an AP of the right knee and that is normal.  Assessment & Plan Primary osteoarthritis of left hip (M16.12) Status post total left knee replacement [id number])  Note:Surgical Plans: Left Total Hip Replacement - Anterior Approach  Disposition:   PCP: Dr. Sherren Mocha Cards: Dr. Lovena Le - Patient has been seen preoperatively and felt to be stable for surgery. "low risk"  IV TXA  Anesthesia Issues: None  Signed electronically by Joelene Millin, III PA-C

## 2015-07-19 LAB — BASIC METABOLIC PANEL
Anion gap: 8 (ref 5–15)
BUN: 9 mg/dL (ref 6–20)
CALCIUM: 8.6 mg/dL — AB (ref 8.9–10.3)
CO2: 24 mmol/L (ref 22–32)
Chloride: 108 mmol/L (ref 101–111)
Creatinine, Ser: 0.73 mg/dL (ref 0.61–1.24)
GFR calc Af Amer: >60 mL/min (ref >60)
GLUCOSE: 128 mg/dL — AB (ref 65–99)
Potassium: 3.7 mmol/L (ref 3.5–5.1)
SODIUM: 140 mmol/L (ref 135–145)

## 2015-07-19 LAB — CBC
HCT: 39 % (ref 39.0–52.0)
Hemoglobin: 13 g/dL (ref 13.0–17.0)
MCH: 31.8 pg (ref 26.0–34.0)
MCHC: 33.3 g/dL (ref 30.0–36.0)
MCV: 95.4 fL (ref 78.0–100.0)
PLATELETS: 184 10*3/uL (ref 150–400)
RBC: 4.09 MIL/uL — AB (ref 4.22–5.81)
RDW: 13 % (ref 11.5–15.5)
WBC: 14.4 10*3/uL — AB (ref 4.0–10.5)

## 2015-07-19 MED ORDER — METHOCARBAMOL 500 MG PO TABS: 500.0000 mg | ORAL_TABLET | Freq: Four times a day (QID) | ORAL | Status: DC | PRN

## 2015-07-19 MED ORDER — RIVAROXABAN 10 MG PO TABS: 10.0000 mg | ORAL_TABLET | Freq: Every day | ORAL | Status: DC

## 2015-07-19 MED ORDER — TRAMADOL HCL 50 MG PO TABS: 50.0000 mg | ORAL_TABLET | Freq: Four times a day (QID) | ORAL | Status: DC | PRN

## 2015-07-19 MED ORDER — OXYCODONE HCL 5 MG PO TABS: 5.0000 mg | ORAL_TABLET | ORAL | Status: DC | PRN

## 2015-07-19 NOTE — Evaluation (Signed)
Occupational Therapy Evaluation Patient Details Name: Garrett Vargas MRN: [id number] DOB: 14-Jun-1946 Today's Date: 07/19/2015    History of Present Illness LDATHA   Clinical Impression   Patient admitted with above. Patient independent PTA. Patient currently functioning at an overall supervision to occasional min assist level.  No additional OT needs identified, D/C from acute OT services and no additional follow-up OT needs at this time. All appropriate education provided to patient. Please re-order OT if needed.      Follow Up Recommendations  No OT follow up;Supervision - Intermittent    Equipment Recommendations  None recommended by OT    Recommendations for Other Services  None known at this time    Precautions / Restrictions Precautions Precautions: Fall Restrictions Weight Bearing Restrictions: No    Mobility Bed Mobility Overal bed mobility: Modified Independent Transfers Overall transfer level: Needs assistance Equipment used: Rolling walker (2 wheeled) Transfers: Sit to/from Stand Sit to Stand: Supervision General transfer comment: distant supervision for safety    Balance Overall balance assessment: Needs assistance Sitting-balance support: No upper extremity supported;Feet supported Sitting balance-Leahy Scale: Normal     Standing balance support: Bilateral upper extremity supported;During functional activity Standing balance-Leahy Scale: Good    ADL Overall ADL's : Needs assistance/impaired General ADL Comments: Pt overall supervision for ADLs and functional mobility, pt requires up to min assist with LB ADLs. Pt states his wife/family will be able to assist prn. Pt plans to discharge home today and is eager to go home. Pt ambulated into BR for toilet transfer using elevated toilet seat w/ grab bar and walk-in shower transfer anterior/posterior technique.     Pertinent Vitals/Pain Pain Assessment: 0-10 Pain Score: 2  Pain Location: LLE Pain  Descriptors / Indicators: Sore;Aching Pain Intervention(s): Monitored during session    Hand Dominance Right   Extremity/Trunk Assessment Upper Extremity Assessment Upper Extremity Assessment: Overall WFL for tasks assessed   Lower Extremity Assessment Lower Extremity Assessment: Defer to PT evaluation   Cervical / Trunk Assessment Cervical / Trunk Assessment: Normal   Communication Communication Communication: No difficulties   Cognition Arousal/Alertness: Awake/alert Behavior During Therapy: WFL for tasks assessed/performed Overall Cognitive Status: Within Functional Limits for tasks assessed              Home Living Family/patient expects to be discharged to:: Private residence Living Arrangements: Spouse/significant other Available Help at Discharge: Family Type of Home: House Home Access: Stairs to enter Technical brewer of Steps: 3   Home Layout: Two level;Full bath on main level;Bed/bath upstairs Alternate Level Stairs-Number of Steps: 5 +10 Alternate Level Stairs-Rails: Left;Right Bathroom Shower/Tub: Walk-in shower;Door   [employer] Toilet: Handicapped height     Home Equipment: Shower seat - built in;Crutches;Cane - quad;Grab bars - tub/shower   Prior Functioning/Environment Level of Independence: Independent     OT Diagnosis: Generalized weakness;Acute pain   OT Problem List:  n/a, no acute OT needs identified   OT Treatment/Interventions:   n/a, no acute OT needs identified    OT Goals(Current goals can be found in the care plan section) Acute Rehab OT Goals Patient Stated Goal: to go home OT Goal Formulation: All assessment and education complete, DC therapy  OT Frequency:  n/a, no acute OT needs identified    Barriers to D/C:  none known at this time   End of Session Equipment Utilized During Treatment: Rolling walker  Activity Tolerance: Patient tolerated treatment well Patient left: in chair;with call bell/phone within reach;with  chair alarm set  [id number] OT Time Calculation (min): 23 min Charges:  OT General Charges $OT Visit: 1 Procedure OT Evaluation $Initial OT Evaluation Tier I: 1 Procedure OT Treatments $Self Care/Home Management : 8-22 mins  Chrys Racer , MS, OTR/L, CLT Pager: [phone]  07/19/2015, 9:24 AM

## 2015-07-19 NOTE — Care Management Note (Addendum)
Case Management Note  Patient Details  Name: Garrett Vargas MRN: [id number] Date of Birth: 1946/07/01  Subjective/Objective:  69 y.o. M admitted 07/18/2015 L THA AA. Anticipate discharge home with RW. No follow up required. [facility] rep will provide DME per Merry Proud.                   Action/Plan: Anticipate discharge home today. No further CM needs but will be available should additional discharge needs arise.   Expected Discharge Date:                  Expected Discharge Plan:     In-House Referral:     Discharge planning Services  CM Consult  Post Acute Care Choice:    Choice offered to:     DME Arranged:  Walker rolling DME Agency:  [place]:    [place] Agency:     Status of Service:  Completed, signed off  Medicare Important Message Given:    Date Medicare IM Given:    Medicare IM give by:    Date Additional Medicare IM Given:    Additional Medicare Important Message give by:     If discussed at [place] of Stay Meetings, dates discussed:    Additional Comments:  Delrae Sawyers, RN 07/19/2015, 9:33 AM

## 2015-07-19 NOTE — Progress Notes (Signed)
Physical Therapy Treatment Patient Details Name: Garrett Vargas MRN: [id number] DOB: [birth year] Today's Date: 07/19/2015    History of Present Illness LDATHA    PT Comments    POD # 1 am session.  Assisted with amb a greater distance, practiced stairs then returned to room to perform all THR TE's following HEP followed by ICE.  Pt ready for D/C to home.   Follow Up Recommendations  No PT follow up;Supervision/Assistance - 24 hour     Equipment Recommendations  Rolling walker with 5" wheels    Recommendations for Other Services       Precautions / Restrictions Precautions Precautions: Fall Restrictions Weight Bearing Restrictions: No    Mobility  Bed Mobility Overal bed mobility: Modified Independent Bed Mobility: Sit to Supine       Sit to supine: Modified independent (Device/Increase time)   General bed mobility comments: support the left leg and increased time  Transfers Overall transfer level: Needs assistance Equipment used: Rolling walker (2 wheeled) Transfers: Sit to/from Stand Sit to Stand: Modified independent (Device/Increase time);Supervision         General transfer comment: good safety cognition and use of hands to syeady self  Ambulation/Gait Ambulation/Gait assistance: Supervision Ambulation Distance (Feet): 285 Feet Assistive device: Rolling walker (2 wheeled) Gait Pattern/deviations: Step-through pattern Gait velocity: decreased   General Gait Details: good alternating gait with min c/o pain.  One VC on safety with backward gait.     Stairs            Wheelchair Mobility    Modified Rankin (Stroke Patients Only)       Balance Overall balance assessment: Needs assistance Sitting-balance support: No upper extremity supported;Feet supported Sitting balance-Leahy Scale: Normal     Standing balance support: Bilateral upper extremity supported;During functional activity Standing balance-Leahy Scale: Good                       Cognition Arousal/Alertness: Awake/alert Behavior During Therapy: WFL for tasks assessed/performed Overall Cognitive Status: Within Functional Limits for tasks assessed                      Exercises   Total Hip Replacement TE's 15 reps ankle pumps 15 reps knee presses 15 reps heel slides 15 reps SAQ's 15 reps ABD 15 reps all standing TE's Followed by ICE     General Comments        Pertinent Vitals/Pain Pain Assessment: 0-10 Pain Score: 4  Pain Location: hip Pain Descriptors / Indicators: Sore;Tender;Tightness Pain Intervention(s): Monitored during session;Repositioned;Ice applied    Home Living Family/patient expects to be discharged to:: Private residence Living Arrangements: Spouse/significant other Available Help at Discharge: Family Type of Home: House Home Access: Stairs to enter   [place]: Two level;Full bath on main level;Bed/bath upstairs Home Equipment: Shower seat - built in;Crutches;Cane - quad;Grab bars - tub/shower      Prior Function Level of Independence: Independent          PT Goals (current goals can now be found in the care plan section) Acute Rehab PT Goals Patient Stated Goal: to go home Progress towards PT goals: Progressing toward goals    Frequency  7X/week    PT Plan      Co-evaluation             End of Session Equipment Utilized During Treatment: Gait belt Activity Tolerance: Patient tolerated treatment well Patient left: with call bell/phone within reach;in bed  Time: 10:00- 10:45    Charges:  $Gait Training: 8-22 mins $Therapeutic Exercise: 8-22 mins                    G Codes:      Rica Koyanagi  PTA [facility]  Acute  Rehab Pager      [phone]

## 2015-07-19 NOTE — Discharge Instructions (Addendum)
° °Dr. Frank Aluisio °Total Joint Specialist °[facility] °[address] °[place], [place] [address] °[phone] ° °TOTAL KNEE REPLACEMENT POSTOPERATIVE DIRECTIONS ° °Knee Rehabilitation, Guidelines Following Surgery  °Results after knee surgery are often greatly improved when you follow the exercise, range of motion and muscle strengthening exercises prescribed by your doctor. Safety measures are also important to protect the knee from further injury. Any time any of these exercises cause you to have increased pain or swelling in your knee joint, decrease the amount until you are comfortable again and slowly increase them. If you have problems or questions, call your caregiver or physical therapist for advice.  ° °HOME CARE INSTRUCTIONS  °Remove items at home which could result in a fall. This includes throw rugs or furniture in walking pathways.  °· ICE to the affected knee every three hours for 30 minutes at a time and then as needed for pain and swelling.  Continue to use ice on the knee for pain and swelling from surgery. You may notice swelling that will progress down to the foot and ankle.  This is normal after surgery.  Elevate the leg when you are not up walking on it.   °· Continue to use the breathing machine which will help keep your temperature down.  It is common for your temperature to cycle up and down following surgery, especially at night when you are not up moving around and exerting yourself.  The breathing machine keeps your lungs expanded and your temperature down. °· Do not place pillow under knee, focus on keeping the knee straight while resting ° °DIET °You may resume your previous home diet once your are discharged from the hospital. ° °DRESSING / WOUND CARE / SHOWERING °You may shower 3 days after surgery, but keep the wounds dry during showering.  You may use an occlusive plastic wrap (Press'n Seal for example), NO SOAKING/SUBMERGING IN THE BATHTUB.  If the  bandage gets wet, change with a clean dry gauze.  If the incision gets wet, pat the wound dry with a clean towel. °You may start showering once you are discharged home but do not submerge the incision under water. Just pat the incision dry and apply a dry gauze dressing on daily. °Change the surgical dressing daily and reapply a dry dressing each time. ° °ACTIVITY °Walk with your walker as instructed. °Use walker as long as suggested by your caregivers. °Avoid periods of inactivity such as sitting longer than an hour when not asleep. This helps prevent blood clots.  °You may resume a sexual relationship in one month or when given the OK by your doctor.  °You may return to work once you are cleared by your doctor.  °Do not drive a car for 6 weeks or until released by you surgeon.  °Do not drive while taking narcotics. ° °WEIGHT BEARING °Weight bearing as tolerated with assist device (walker, cane, etc) as directed, use it as long as suggested by your surgeon or therapist, typically at least 4-6 weeks. ° °POSTOPERATIVE CONSTIPATION PROTOCOL °Constipation - defined medically as fewer than three stools per week and severe constipation as less than one stool per week. ° °One of the most common issues patients have following surgery is constipation.  Even if you have a regular bowel pattern at home, your normal regimen is likely to be disrupted due to multiple reasons following surgery.  Combination of anesthesia, postoperative narcotics, change in appetite and fluid intake all can affect your bowels.    In order to avoid complications following surgery, here are some recommendations in order to help you during your recovery period. ° °Colace (docusate) - Pick up an over-the-counter form of Colace or another stool softener and take twice a day as long as you are requiring postoperative pain medications.  Take with a full glass of water daily.  If you experience loose stools or diarrhea, hold the colace until you stool forms  back up.  If your symptoms do not get better within 1 week or if they get worse, check with your doctor. ° °Dulcolax (bisacodyl) - Pick up over-the-counter and take as directed by the product packaging as needed to assist with the movement of your bowels.  Take with a full glass of water.  Use this product as needed if not relieved by Colace only.  ° °MiraLax (polyethylene glycol) - Pick up over-the-counter to have on hand.  MiraLax is a solution that will increase the amount of water in your bowels to assist with bowel movements.  Take as directed and can mix with a glass of water, juice, soda, coffee, or tea.  Take if you go more than two days without a movement. °Do not use MiraLax more than once per day. Call your doctor if you are still constipated or irregular after using this medication for 7 days in a row. ° °If you continue to have problems with postoperative constipation, please contact the office for further assistance and recommendations.  If you experience "the worst abdominal pain ever" or develop nausea or vomiting, please contact the office immediatly for further recommendations for treatment. ° °ITCHING ° If you experience itching with your medications, try taking only a single pain pill, or even half a pain pill at a time.  You can also use Benadryl over the counter for itching or also to help with sleep.  ° °TED HOSE STOCKINGS °Wear the elastic stockings on both legs for three weeks following surgery during the day but you may remove then at night for sleeping. ° °MEDICATIONS °See your medication summary on the “After Visit Summary” that the nursing staff will review with you prior to discharge.  You may have some home medications which will be placed on hold until you complete the course of blood thinner medication.  It is important for you to complete the blood thinner medication as prescribed by your surgeon.  Continue your approved medications as instructed at time of  discharge. ° °PRECAUTIONS °If you experience chest pain or shortness of breath - call 911 immediately for transfer to the hospital emergency department.  °If you develop a fever greater that 101 F, purulent drainage from wound, increased redness or drainage from wound, foul odor from the wound/dressing, or calf pain - CONTACT YOUR SURGEON.   °                                                °FOLLOW-UP APPOINTMENTS °Make sure you keep all of your appointments after your operation with your surgeon and caregivers. You should call the office at the above phone number and make an appointment for approximately two weeks after the date of your surgery or on the date instructed by your surgeon outlined in the "After Visit Summary". ° ° °RANGE OF MOTION AND STRENGTHENING EXERCISES  °Rehabilitation of the knee is important following a knee injury or   an operation. After just a few days of immobilization, the muscles of the thigh which control the knee become weakened and shrink (atrophy). Knee exercises are designed to build up the tone and strength of the thigh muscles and to improve knee motion. Often times heat used for twenty to thirty minutes before working out will loosen up your tissues and help with improving the range of motion but do not use heat for the first two weeks following surgery. These exercises can be done on a training (exercise) mat, on the floor, on a table or on a bed. Use what ever works the best and is most comfortable for you Knee exercises include:  Leg Lifts - While your knee is still immobilized in a splint or cast, you can do straight leg raises. Lift the leg to 60 degrees, hold for 3 sec, and slowly lower the leg. Repeat 10-20 times 2-3 times daily. Perform this exercise against resistance later as your knee gets better.  Quad and Hamstring Sets - Tighten up the muscle on the front of the thigh (Quad) and hold for 5-10 sec. Repeat this 10-20 times hourly. Hamstring sets are done by pushing the  foot backward against an object and holding for 5-10 sec. Repeat as with quad sets.   Leg Slides: Lying on your back, slowly slide your foot toward your buttocks, bending your knee up off the floor (only go as far as is comfortable). Then slowly slide your foot back down until your leg is flat on the floor again.  Angel Wings: Lying on your back spread your legs to the side as far apart as you can without causing discomfort.  A rehabilitation program following serious knee injuries can speed recovery and prevent re-injury in the future due to weakened muscles. Contact your doctor or a physical therapist for more information on knee rehabilitation.   IF YOU ARE TRANSFERRED TO A SKILLED REHAB FACILITY If the patient is transferred to a skilled rehab facility following release from the hospital, a list of the current medications will be sent to the facility for the patient to continue.  When discharged from the skilled rehab facility, please have the facility set up the patient's [place] prior to being released. Also, the skilled facility will be responsible for providing the patient with their medications at time of release from the facility to include their pain medication, the muscle relaxants, and their blood thinner medication. If the patient is still at the rehab facility at time of the two week follow up appointment, the skilled rehab facility will also need to assist the patient in arranging follow up appointment in our office and any transportation needs.  MAKE SURE YOU:  Understand these instructions.  Get help right away if you are not doing well or get worse.    Pick up stool softner and laxative for home use following surgery while on pain medications. Do not submerge incision under water. May remove the surgical dressing tomorrow, Friday 07/20/2015, and then apply a dry gauze dressing daily. Please use good hand washing techniques while changing dressing each  day. May shower starting three days after surgery starting Saturday 07/21/2015. Please use a clean towel to pat the incision dry following showers. Continue to use ice for pain and swelling after surgery. Do not use any lotions or creams on the incision until instructed by your surgeon.  Take Xarelto for two and a half more weeks, then discontinue Xarelto. Once the patient has completed  the Xarelto, they may resume the 81 mg Aspirin.  Information on my medicine - XARELTO (Rivaroxaban)  This medication education was reviewed with me or my healthcare representative as part of my discharge preparation.  The pharmacist that spoke with me during my hospital stay was:  Leeroy Bock, [facility]  Why was Xarelto prescribed for you? Xarelto was prescribed for you to reduce the risk of blood clots forming after orthopedic surgery. The medical term for these abnormal blood clots is venous thromboembolism (VTE).  What do you need to know about xarelto ? Take your Xarelto ONCE DAILY at the same time every day. You may take it either with or without food.  If you have difficulty swallowing the tablet whole, you may crush it and mix in applesauce just prior to taking your dose.  Take Xarelto exactly as prescribed by your doctor and DO NOT stop taking Xarelto without talking to the doctor who prescribed the medication.  Stopping without other VTE prevention medication to take the place of Xarelto may increase your risk of developing a clot.  After discharge, you should have regular check-up appointments with your healthcare provider that is prescribing your Xarelto.    What do you do if you miss a dose? If you miss a dose, take it as soon as you remember on the same day then continue your regularly scheduled once daily regimen the next day. Do not take two doses of Xarelto on the same day.   Important Safety Information A possible side effect of Xarelto is bleeding. You should call  your healthcare provider right away if you experience any of the following: ? Bleeding from an injury or your nose that does not stop. ? Unusual colored urine (red or dark brown) or unusual colored stools (red or black). ? Unusual bruising for unknown reasons. ? A serious fall or if you hit your head (even if there is no bleeding).  Some medicines may interact with Xarelto and might increase your risk of bleeding while on Xarelto. To help avoid this, consult your healthcare provider or pharmacist prior to using any new prescription or non-prescription medications, including herbals, vitamins, non-steroidal anti-inflammatory drugs (NSAIDs) and supplements.  This website has more information on Xarelto: [email or web address]

## 2015-07-19 NOTE — Progress Notes (Signed)
   Subjective: 1 Day Post-Op Procedure(s) (LRB): LEFT TOTAL HIP ARTHROPLASTY ANTERIOR APPROACH (Left) Patient reports pain as mild.   Patient seen in rounds by Dr. Wynelle Link. Patient is well, and has had no acute complaints or problems We will resume therapy today.  If they do well with therapy and meets all goals, then will allow home this morning. Plan is to go Home after hospital stay.  Objective: Vital signs in last 24 hours: Temp:  [96.3 F (35.7 C)-98.2 F (36.8 C)] 98 F (36.7 C) (12/15 0530) Pulse Rate:  [56-95] 95 (12/15 0530) Resp:  [10-18] 16 (12/15 0530) BP: (76-131)/(49-79) 111/61 mmHg (12/15 0530) SpO2:  [92 %-100 %] 93 % (12/15 0530) Weight:  [83.462 kg (184 lb)] 83.462 kg (184 lb) (12/14 0717)  Intake/Output from previous day:  Intake/Output Summary (Last 24 hours) at 07/19/15 0705 Last data filed at 07/19/15 0531  Gross per 24 hour  Intake 3841.67 ml  Output   3965 ml  Net -123.33 ml    Intake/Output this shift: UOP 1600 since last night  Labs:  Recent Labs  07/19/15 0548  HGB 13.0    Recent Labs  07/19/15 0548  WBC 14.4*  RBC 4.09*  HCT 39.0  PLT 184    Recent Labs  07/19/15 0548  NA 140  K 3.7  CL 108  CO2 24  BUN 9  CREATININE 0.73  GLUCOSE 128*  CALCIUM 8.6*   No results for input(s): LABPT, INR in the last 72 hours.  EXAM General - Patient is Alert, Appropriate and Oriented Extremity - Neurovascular intact Sensation intact distally Dorsiflexion/Plantar flexion intact Dressing - dressing C/D/I Motor Function - intact, moving foot and toes well on exam.  Hemovac pulled without difficulty.  Past Medical History  Diagnosis Date  . ALLERGIC RHINITIS   . A-fib ([place])   . Hyperlipemia   . Erectile dysfunction   . S/P lateral meniscectomy of left knee   . Dysrhythmia   . Hypertension   . History of bronchitis as a child   . Arthritis   . Colon polyp     Assessment/Plan: 1 Day Post-Op Procedure(s) (LRB): LEFT TOTAL  HIP ARTHROPLASTY ANTERIOR APPROACH (Left) Principal Problem:   OA (osteoarthritis) of hip  Estimated body mass index is 23.61 kg/(m^2) as calculated from the following:   Height as of this encounter: 6\' 2"  (1.88 m).   Weight as of this encounter: 83.462 kg (184 lb). Advance diet Up with therapy Discharge home with home health  DVT Prophylaxis - Xarelto Weight Bearing As Tolerated left Leg Hemovac Pulled Begin Therapy  If meets goals and able to go home: Up with therapy Discharge home with home health Diet - Cardiac diet Follow up - in 2 weeks Activity - WBAT Disposition - Home Condition Upon Discharge - Good D/C Meds - See DC Summary DVT Prophylaxis - Xarelto  Arlee Muslim, PA-C Orthopaedic Surgery 07/19/2015, 7:05 AM

## 2015-07-19 NOTE — Discharge Summary (Signed)
Physician Discharge Summary   Patient ID: Garrett Vargas MRN: [id number] DOB/AGE: Feb 11, 1946 69 y.o.  Admit date: 07/18/2015 Discharge date: 07-19-2015  Primary Diagnosis:  Osteoarthritis of the Left hip.   Admission Diagnoses:  Past Medical History  Diagnosis Date  . ALLERGIC RHINITIS   . A-fib ([place])   . Hyperlipemia   . Erectile dysfunction   . S/P lateral meniscectomy of left knee   . Dysrhythmia   . Hypertension   . History of bronchitis as a child   . Arthritis   . Colon polyp    Discharge Diagnoses:   Principal Problem:   OA (osteoarthritis) of hip  Estimated body mass index is 23.61 kg/(m^2) as calculated from the following:   Height as of this encounter: 6' 2"  (1.88 m).   Weight as of this encounter: 83.462 kg (184 lb).  Procedure(s) (LRB): LEFT TOTAL HIP ARTHROPLASTY ANTERIOR APPROACH (Left)   Consults: None  HPI: Garrett Vargas is a 69 y.o. male who has advanced end-  stage arthritis of his Left hip with progressively worsening pain and  dysfunction.The patient has failed nonoperative management and presents for  total hip arthroplasty.   Laboratory Data: Admission on 07/18/2015  Component Date Value Ref Range Status  . WBC 07/19/2015 14.4* 4.0 - 10.5 K/uL Final  . RBC 07/19/2015 4.09* 4.22 - 5.81 MIL/uL Final  . Hemoglobin 07/19/2015 13.0  13.0 - 17.0 g/dL Final  . HCT 07/19/2015 39.0  39.0 - 52.0 % Final  . MCV 07/19/2015 95.4  78.0 - 100.0 fL Final  . MCH 07/19/2015 31.8  26.0 - 34.0 pg Final  . MCHC 07/19/2015 33.3  30.0 - 36.0 g/dL Final  . RDW 07/19/2015 13.0  11.5 - 15.5 % Final  . Platelets 07/19/2015 184  150 - 400 K/uL Final  . Sodium 07/19/2015 140  135 - 145 mmol/L Final  . Potassium 07/19/2015 3.7  3.5 - 5.1 mmol/L Final  . Chloride 07/19/2015 108  101 - 111 mmol/L Final  . CO2 07/19/2015 24  22 - 32 mmol/L Final  . Glucose, Bld 07/19/2015 128* 65 - 99 mg/dL Final  . BUN 07/19/2015 9  6 - 20 mg/dL Final  . Creatinine, Ser  07/19/2015 0.73  0.61 - 1.24 mg/dL Final  . Calcium 07/19/2015 8.6* 8.9 - 10.3 mg/dL Final  . GFR calc non Af Amer 07/19/2015 >60  >60 mL/min Final  . GFR calc Af Amer 07/19/2015 >60  >60 mL/min Final   Comment: (NOTE) The eGFR has been calculated using the CKD EPI equation. This calculation has not been validated in all clinical situations. eGFR's persistently <60 mL/min signify possible Chronic Kidney Disease.   Georgiann Hahn gap 07/19/2015 8  5 - 15 Final  Hospital Outpatient Visit on 07/13/2015  Component Date Value Ref Range Status  . aPTT 07/13/2015 30  24 - 37 seconds Final  . WBC 07/13/2015 6.4  4.0 - 10.5 K/uL Final  . RBC 07/13/2015 5.05  4.22 - 5.81 MIL/uL Final  . Hemoglobin 07/13/2015 15.9  13.0 - 17.0 g/dL Final  . HCT 07/13/2015 47.5  39.0 - 52.0 % Final  . MCV 07/13/2015 94.1  78.0 - 100.0 fL Final  . MCH 07/13/2015 31.5  26.0 - 34.0 pg Final  . MCHC 07/13/2015 33.5  30.0 - 36.0 g/dL Final  . RDW 07/13/2015 12.9  11.5 - 15.5 % Final  . Platelets 07/13/2015 223  150 - 400 K/uL Final  . Sodium 07/13/2015 140  135 -  145 mmol/L Final  . Potassium 07/13/2015 4.4  3.5 - 5.1 mmol/L Final  . Chloride 07/13/2015 103  101 - 111 mmol/L Final  . CO2 07/13/2015 30  22 - 32 mmol/L Final  . Glucose, Bld 07/13/2015 80  65 - 99 mg/dL Final  . BUN 07/13/2015 11  6 - 20 mg/dL Final  . Creatinine, Ser 07/13/2015 0.82  0.61 - 1.24 mg/dL Final  . Calcium 07/13/2015 9.4  8.9 - 10.3 mg/dL Final  . Total Protein 07/13/2015 6.6  6.5 - 8.1 g/dL Final  . Albumin 07/13/2015 4.1  3.5 - 5.0 g/dL Final  . AST 07/13/2015 34  15 - 41 U/L Final  . ALT 07/13/2015 27  17 - 63 U/L Final  . Alkaline Phosphatase 07/13/2015 53  38 - 126 U/L Final  . Total Bilirubin 07/13/2015 1.1  0.3 - 1.2 mg/dL Final  . GFR calc non Af Amer 07/13/2015 >60  >60 mL/min Final  . GFR calc Af Amer 07/13/2015 >60  >60 mL/min Final   Comment: (NOTE) The eGFR has been calculated using the CKD EPI equation. This calculation has  not been validated in all clinical situations. eGFR's persistently <60 mL/min signify possible Chronic Kidney Disease.   . Anion gap 07/13/2015 7  5 - 15 Final  . Prothrombin Time 07/13/2015 14.2  11.6 - 15.2 seconds Final  . INR 07/13/2015 1.08  0.00 - 1.49 Final  . ABO/RH(D) 07/13/2015 O POS   Final  . Antibody Screen 07/13/2015 NEG   Final  . Sample Expiration 07/13/2015 07/21/2015   Final  . Extend sample reason 07/13/2015 NO TRANSFUSIONS OR PREGNANCY IN THE PAST 3 MONTHS   Final  . Color, Urine 07/13/2015 YELLOW  YELLOW Final  . APPearance 07/13/2015 CLEAR  CLEAR Final  . Specific Gravity, Urine 07/13/2015 1.013  1.005 - 1.030 Final  . pH 07/13/2015 6.5  5.0 - 8.0 Final  . Glucose, UA 07/13/2015 NEGATIVE  NEGATIVE mg/dL Final  . Hgb urine dipstick 07/13/2015 NEGATIVE  NEGATIVE Final  . Bilirubin Urine 07/13/2015 NEGATIVE  NEGATIVE Final  . Ketones, ur 07/13/2015 NEGATIVE  NEGATIVE mg/dL Final  . Protein, ur 07/13/2015 NEGATIVE  NEGATIVE mg/dL Final  . Nitrite 07/13/2015 NEGATIVE  NEGATIVE Final  . Leukocytes, UA 07/13/2015 NEGATIVE  NEGATIVE Final   MICROSCOPIC NOT DONE ON URINES WITH NEGATIVE PROTEIN, BLOOD, LEUKOCYTES, NITRITE, OR GLUCOSE <1000 mg/dL.  Marland Kitchen MRSA, PCR 07/13/2015 NEGATIVE  NEGATIVE Final  . Staphylococcus aureus 07/13/2015 NEGATIVE  NEGATIVE Final   Comment:        The Xpert SA Assay (FDA approved for NASAL specimens in patients over 26 years of age), is one component of a comprehensive surveillance program.  Test performance has been validated by [facility] for patients greater than or equal to 68 year old. It is not intended to diagnose infection nor to guide or monitor treatment.   Office Visit on 06/05/2015  Component Date Value Ref Range Status  . WBC 06/05/2015 6.4  4.0 - 10.5 K/uL Final  . RBC 06/05/2015 5.04  4.22 - 5.81 Mil/uL Final  . Hemoglobin 06/05/2015 15.7  13.0 - 17.0 g/dL Final  . HCT 06/05/2015 46.8  39.0 - 52.0 % Final  . MCV  06/05/2015 93.0  78.0 - 100.0 fl Final  . MCHC 06/05/2015 33.6  30.0 - 36.0 g/dL Final  . RDW 06/05/2015 13.6  11.5 - 15.5 % Final  . Platelets 06/05/2015 218.0  150.0 - 400.0 K/uL Final  . Neutrophils  Relative % 06/05/2015 54.3  43.0 - 77.0 % Final  . Lymphocytes Relative 06/05/2015 32.6  12.0 - 46.0 % Final  . Monocytes Relative 06/05/2015 9.9  3.0 - 12.0 % Final  . Eosinophils Relative 06/05/2015 2.7  0.0 - 5.0 % Final  . Basophils Relative 06/05/2015 0.5  0.0 - 3.0 % Final  . Neutro Abs 06/05/2015 3.4  1.4 - 7.7 K/uL Final  . Lymphs Abs 06/05/2015 2.1  0.7 - 4.0 K/uL Final  . Monocytes Absolute 06/05/2015 0.6  0.1 - 1.0 K/uL Final  . Eosinophils Absolute 06/05/2015 0.2  0.0 - 0.7 K/uL Final  . Basophils Absolute 06/05/2015 0.0  0.0 - 0.1 K/uL Final     X-Rays:Dg Pelvis Portable  07/18/2015  CLINICAL DATA:  Postoperative evaluation left hip replacement EXAM: PORTABLE PELVIS 1-2 VIEWS COMPARISON:  None. FINDINGS: Single image shows hip replacement on the left with components well position and no radiographically detectable complication. Soft tissue drain is in place. Mild osteoarthritis of the right hip. IMPRESSION: Good appearance following total hip replacement on the left. Electronically Signed   By: Nelson Chimes M.D.   On: 07/18/2015 10:23   Dg C-arm 1-60 Min-no Report  07/18/2015  CLINICAL DATA: surgery C-ARM 1-60 MINUTES Fluoroscopy was utilized by the requesting physician.  No radiographic interpretation.    EKG: Orders placed or performed in visit on 06/06/15  . EKG 12-Lead     Hospital Course: Patient was admitted to [facility] and taken to the OR and underwent the above state procedure without complications.  Patient tolerated the procedure well and was later transferred to the recovery room and then to the orthopaedic floor for postoperative care.  They were given PO and IV analgesics for pain control following their surgery.  They were given 24 hours of  postoperative antibiotics of      Anti-infectives    Start     Dose/Rate Route Frequency Ordered Stop   07/18/15 1430  ceFAZolin (ANCEF) IVPB 2 g/50 mL premix     2 g 100 mL/hr over 30 Minutes Intravenous Every 6 hours 07/18/15 1220 07/18/15 2121   07/18/15 0713  ceFAZolin (ANCEF) IVPB 2 g/50 mL premix     2 g 100 mL/hr over 30 Minutes Intravenous On call to O.R. 07/18/15 6314 07/18/15 0826     and started on DVT prophylaxis in the form of Xarelto.   PT and OT were ordered for total hip protocol.  The patient was allowed to be WBAT with therapy. Discharge planning was consulted to help with postop disposition and equipment needs.  Patient had a good night on the evening of surgery.  They started to get up OOB with therapy on day the day of surgery and walked over 200 feet.  Hemovac drain was pulled without difficulty on day one. Dressing was checked and clean and day. Patient was seen in rounds on POD 1 by Dr. Wynelle Link and was ready to go home later that day following therapy.  Discharge home with home health Diet - Cardiac diet Follow up - in 2 weeks Activity - WBAT Disposition - Home Condition Upon Discharge - Good D/C Meds - See DC Summary DVT Prophylaxis - Xarelto  Discharge Instructions    Call MD / Call 911    Complete by:  As directed   If you experience chest pain or shortness of breath, CALL 911 and be transported to the hospital emergency room.  If you develope a fever above 101 F,  pus (white drainage) or increased drainage or redness at the wound, or calf pain, call your surgeon's office.     Change dressing    Complete by:  As directed   You may change your dressing dressing daily with sterile 4 x 4 inch gauze dressing and paper tape.  Do not submerge the incision under water.     Constipation Prevention    Complete by:  As directed   Drink plenty of fluids.  Prune juice may be helpful.  You may use a stool softener, such as Colace (over the counter) 100 mg twice a day.  Use  MiraLax (over the counter) for constipation as needed.     Diet - low sodium heart healthy    Complete by:  As directed      Discharge instructions    Complete by:  As directed   Pick up stool softner and laxative for home use following surgery while on pain medications. Do not submerge incision under water. May remove the surgical dressing tomorrow, Friday 07/20/2015, and then apply a dry gauze dressing daily. Please use good hand washing techniques while changing dressing each day. May shower starting three days after surgery starting Saturday 07/21/2015. Please use a clean towel to pat the incision dry following showers. Continue to use ice for pain and swelling after surgery. Do not use any lotions or creams on the incision until instructed by your surgeon.  Total Hip Protocol.  Take Xarelto for two and a half more weeks, then discontinue Xarelto. Once the patient has completed the Xarelto, they may resume the 81 mg Aspirin.  Postoperative Constipation Protocol  Constipation - defined medically as fewer than three stools per week and severe constipation as less than one stool per week.  One of the most common issues patients have following surgery is constipation.  Even if you have a regular bowel pattern at home, your normal regimen is likely to be disrupted due to multiple reasons following surgery.  Combination of anesthesia, postoperative narcotics, change in appetite and fluid intake all can affect your bowels.  In order to avoid complications following surgery, here are some recommendations in order to help you during your recovery period.  Colace (docusate) - Pick up an over-the-counter form of Colace or another stool softener and take twice a day as long as you are requiring postoperative pain medications.  Take with a full glass of water daily.  If you experience loose stools or diarrhea, hold the colace until you stool forms back up.  If your symptoms do not get better within 1  week or if they get worse, check with your doctor.  Dulcolax (bisacodyl) - Pick up over-the-counter and take as directed by the product packaging as needed to assist with the movement of your bowels.  Take with a full glass of water.  Use this product as needed if not relieved by Colace only.   MiraLax (polyethylene glycol) - Pick up over-the-counter to have on hand.  MiraLax is a solution that will increase the amount of water in your bowels to assist with bowel movements.  Take as directed and can mix with a glass of water, juice, soda, coffee, or tea.  Take if you go more than two days without a movement. Do not use MiraLax more than once per day. Call your doctor if you are still constipated or irregular after using this medication for 7 days in a row.  If you continue to have problems with postoperative constipation,  please contact the office for further assistance and recommendations.  If you experience "the worst abdominal pain ever" or develop nausea or vomiting, please contact the office immediatly for further recommendations for treatment.     Do not sit on low chairs, stoools or toilet seats, as it may be difficult to get up from low surfaces    Complete by:  As directed      Driving restrictions    Complete by:  As directed   No driving until released by the physician.     Increase activity slowly as tolerated    Complete by:  As directed      Lifting restrictions    Complete by:  As directed   No lifting until released by the physician.     Patient may shower    Complete by:  As directed   You may shower without a dressing once there is no drainage.  Do not wash over the wound.  If drainage remains, do not shower until drainage stops.     TED hose    Complete by:  As directed   Use stockings (TED hose) for 3 weeks on both leg(s).  You may remove them at night for sleeping.     Weight bearing as tolerated    Complete by:  As directed   Laterality:  left  Extremity:  Lower             Medication List    STOP taking these medications        aspirin 81 MG EC tablet     CENTRUM SILVER tablet     omega-3 acid ethyl esters 1 G capsule  Commonly known as:  LOVAZA     sildenafil 20 MG tablet  Commonly known as:  REVATIO      TAKE these medications        atenolol 25 MG tablet  Commonly known as:  TENORMIN  Take one half tablet by mouth daily     atorvastatin 20 MG tablet  Commonly known as:  LIPITOR  Take 1 tablet by mouth  daily     methocarbamol 500 MG tablet  Commonly known as:  ROBAXIN  Take 1 tablet (500 mg total) by mouth every 6 (six) hours as needed for muscle spasms.     niacin 1000 MG CR tablet  Commonly known as:  NIASPAN  Take 1 tablet (1,000 mg  total) by mouth at bedtime.     oxyCODONE 5 MG immediate release tablet  Commonly known as:  Oxy IR/ROXICODONE  Take 1-2 tablets (5-10 mg total) by mouth every 3 (three) hours as needed for moderate pain or severe pain.     rivaroxaban 10 MG Tabs tablet  Commonly known as:  XARELTO  Take 1 tablet (10 mg total) by mouth daily with breakfast. Take Xarelto for two and a half more weeks, then discontinue Xarelto. Once the patient has completed the Xarelto, they may resume the 81 mg Aspirin.     traMADol 50 MG tablet  Commonly known as:  ULTRAM  Take 1-2 tablets (50-100 mg total) by mouth every 6 (six) hours as needed (mild pain).       Follow-up Information    Follow up with Gearlean Alf, MD. Schedule an appointment as soon as possible for a visit in 2 weeks.   Specialty:  Orthopedic Surgery   Why:  Call office at [phone] to setup appointment in two weeks with one of the PAs for Dr.  Aluisio.   Contact information:   [address] [place] [address] [phone]       Signed: Arlee Muslim, PA-C Orthopaedic Surgery 07/19/2015, 7:21 AM

## 2015-07-19 NOTE — Progress Notes (Signed)
Patient is alert and oriented. VS stable. No s/s of acute distress. Patient being discharged home, reviewed discharge education, information and instructions. Patient and wife states understanding. No questions at this time. IV removed. Patient discharged via wheelchair by nurse aide.

## 2015-07-21 DIAGNOSIS — I10 Essential (primary) hypertension: Secondary | ICD-10-CM | POA: Diagnosis not present

## 2015-07-21 DIAGNOSIS — Z471 Aftercare following joint replacement surgery: Secondary | ICD-10-CM | POA: Diagnosis not present

## 2015-07-21 DIAGNOSIS — I4891 Unspecified atrial fibrillation: Secondary | ICD-10-CM | POA: Diagnosis not present

## 2015-07-21 DIAGNOSIS — Z96642 Presence of left artificial hip joint: Secondary | ICD-10-CM | POA: Diagnosis not present

## 2015-07-21 DIAGNOSIS — M1990 Unspecified osteoarthritis, unspecified site: Secondary | ICD-10-CM | POA: Diagnosis not present

## 2015-07-24 DIAGNOSIS — Z96642 Presence of left artificial hip joint: Secondary | ICD-10-CM | POA: Diagnosis not present

## 2015-07-24 DIAGNOSIS — I4891 Unspecified atrial fibrillation: Secondary | ICD-10-CM | POA: Diagnosis not present

## 2015-07-24 DIAGNOSIS — Z471 Aftercare following joint replacement surgery: Secondary | ICD-10-CM | POA: Diagnosis not present

## 2015-07-24 DIAGNOSIS — M1990 Unspecified osteoarthritis, unspecified site: Secondary | ICD-10-CM | POA: Diagnosis not present

## 2015-07-24 DIAGNOSIS — I10 Essential (primary) hypertension: Secondary | ICD-10-CM | POA: Diagnosis not present

## 2015-07-26 DIAGNOSIS — I4891 Unspecified atrial fibrillation: Secondary | ICD-10-CM | POA: Diagnosis not present

## 2015-07-26 DIAGNOSIS — Z471 Aftercare following joint replacement surgery: Secondary | ICD-10-CM | POA: Diagnosis not present

## 2015-07-26 DIAGNOSIS — I10 Essential (primary) hypertension: Secondary | ICD-10-CM | POA: Diagnosis not present

## 2015-07-26 DIAGNOSIS — M1990 Unspecified osteoarthritis, unspecified site: Secondary | ICD-10-CM | POA: Diagnosis not present

## 2015-07-26 DIAGNOSIS — Z96642 Presence of left artificial hip joint: Secondary | ICD-10-CM | POA: Diagnosis not present

## 2015-08-01 DIAGNOSIS — I10 Essential (primary) hypertension: Secondary | ICD-10-CM | POA: Diagnosis not present

## 2015-08-01 DIAGNOSIS — I4891 Unspecified atrial fibrillation: Secondary | ICD-10-CM | POA: Diagnosis not present

## 2015-08-01 DIAGNOSIS — Z471 Aftercare following joint replacement surgery: Secondary | ICD-10-CM | POA: Diagnosis not present

## 2015-08-01 DIAGNOSIS — Z96642 Presence of left artificial hip joint: Secondary | ICD-10-CM | POA: Diagnosis not present

## 2015-08-01 DIAGNOSIS — M1990 Unspecified osteoarthritis, unspecified site: Secondary | ICD-10-CM | POA: Diagnosis not present

## 2015-08-02 DIAGNOSIS — Z471 Aftercare following joint replacement surgery: Secondary | ICD-10-CM | POA: Diagnosis not present

## 2015-08-02 DIAGNOSIS — Z96642 Presence of left artificial hip joint: Secondary | ICD-10-CM | POA: Diagnosis not present

## 2015-08-03 DIAGNOSIS — Z96642 Presence of left artificial hip joint: Secondary | ICD-10-CM | POA: Diagnosis not present

## 2015-08-03 DIAGNOSIS — I4891 Unspecified atrial fibrillation: Secondary | ICD-10-CM | POA: Diagnosis not present

## 2015-08-03 DIAGNOSIS — I10 Essential (primary) hypertension: Secondary | ICD-10-CM | POA: Diagnosis not present

## 2015-08-03 DIAGNOSIS — Z471 Aftercare following joint replacement surgery: Secondary | ICD-10-CM | POA: Diagnosis not present

## 2015-08-03 DIAGNOSIS — M1990 Unspecified osteoarthritis, unspecified site: Secondary | ICD-10-CM | POA: Diagnosis not present

## 2015-08-28 DIAGNOSIS — Z96642 Presence of left artificial hip joint: Secondary | ICD-10-CM | POA: Diagnosis not present

## 2015-08-28 DIAGNOSIS — Z471 Aftercare following joint replacement surgery: Secondary | ICD-10-CM | POA: Diagnosis not present

## 2015-10-09 DIAGNOSIS — Z96642 Presence of left artificial hip joint: Secondary | ICD-10-CM | POA: Diagnosis not present

## 2015-10-09 DIAGNOSIS — Z471 Aftercare following joint replacement surgery: Secondary | ICD-10-CM | POA: Diagnosis not present

## 2016-01-03 DIAGNOSIS — L820 Inflamed seborrheic keratosis: Secondary | ICD-10-CM | POA: Diagnosis not present

## 2016-01-03 DIAGNOSIS — D1801 Hemangioma of skin and subcutaneous tissue: Secondary | ICD-10-CM | POA: Diagnosis not present

## 2016-01-03 DIAGNOSIS — L812 Freckles: Secondary | ICD-10-CM | POA: Diagnosis not present

## 2016-04-09 DIAGNOSIS — H5203 Hypermetropia, bilateral: Secondary | ICD-10-CM | POA: Diagnosis not present

## 2016-06-18 ENCOUNTER — Encounter: Payer: Medicare Other | Admitting: Family Medicine

## 2016-06-20 ENCOUNTER — Other Ambulatory Visit (INDEPENDENT_AMBULATORY_CARE_PROVIDER_SITE_OTHER): Payer: Medicare Other

## 2016-06-20 DIAGNOSIS — Z141 Cystic fibrosis carrier: Secondary | ICD-10-CM

## 2016-06-24 ENCOUNTER — Telehealth: Payer: Self-pay | Admitting: Family Medicine

## 2016-06-24 NOTE — Telephone Encounter (Signed)
LEFT A MESSAGE TO TELL THE PATIENT TEST RESULTS WILL BE BACK ON Tuesday 07-01-16 (CYSTIC FIBROSIS TEST)  SHAY 06-24-16

## 2016-06-25 ENCOUNTER — Encounter: Payer: Self-pay | Admitting: Family Medicine

## 2016-06-25 ENCOUNTER — Ambulatory Visit (INDEPENDENT_AMBULATORY_CARE_PROVIDER_SITE_OTHER): Payer: Medicare Other | Admitting: Family Medicine

## 2016-06-25 VITALS — BP 114/80 | HR 76 | Temp 98.4°F | Ht 74.0 in | Wt 184.7 lb

## 2016-06-25 DIAGNOSIS — R351 Nocturia: Secondary | ICD-10-CM | POA: Diagnosis not present

## 2016-06-25 DIAGNOSIS — J3089 Other allergic rhinitis: Secondary | ICD-10-CM | POA: Diagnosis not present

## 2016-06-25 DIAGNOSIS — E7800 Pure hypercholesterolemia, unspecified: Secondary | ICD-10-CM

## 2016-06-25 DIAGNOSIS — N401 Enlarged prostate with lower urinary tract symptoms: Secondary | ICD-10-CM

## 2016-06-25 DIAGNOSIS — I10 Essential (primary) hypertension: Secondary | ICD-10-CM | POA: Diagnosis not present

## 2016-06-25 DIAGNOSIS — F528 Other sexual dysfunction not due to a substance or known physiological condition: Secondary | ICD-10-CM

## 2016-06-25 LAB — BASIC METABOLIC PANEL
BUN: 12 mg/dL (ref 6–23)
CALCIUM: 9.4 mg/dL (ref 8.4–10.5)
CO2: 27 meq/L (ref 19–32)
Chloride: 102 mEq/L (ref 96–112)
Creatinine, Ser: 0.81 mg/dL (ref 0.40–1.50)
GFR: 99.89 mL/min (ref >60.00)
Glucose, Bld: 93 mg/dL (ref 70–99)
Potassium: 4.2 mEq/L (ref 3.5–5.1)
SODIUM: 139 meq/L (ref 135–145)

## 2016-06-25 LAB — CBC WITH DIFFERENTIAL/PLATELET
BASOS ABS: 0 10*3/uL (ref 0.0–0.1)
Basophils Relative: 0.2 % (ref 0.0–3.0)
EOS ABS: 0.2 10*3/uL (ref 0.0–0.7)
Eosinophils Relative: 3 % (ref 0.0–5.0)
HEMATOCRIT: 48.6 % (ref 39.0–52.0)
HEMOGLOBIN: 16.3 g/dL (ref 13.0–17.0)
LYMPHS PCT: 34.2 % (ref 12.0–46.0)
Lymphs Abs: 2.4 10*3/uL (ref 0.7–4.0)
MCHC: 33.5 g/dL (ref 30.0–36.0)
MCV: 92.9 fl (ref 78.0–100.0)
Monocytes Absolute: 0.6 10*3/uL (ref 0.1–1.0)
Monocytes Relative: 7.8 % (ref 3.0–12.0)
NEUTROS ABS: 3.9 10*3/uL (ref 1.4–7.7)
Neutrophils Relative %: 54.8 % (ref 43.0–77.0)
PLATELETS: 233 10*3/uL (ref 150.0–400.0)
RBC: 5.24 Mil/uL (ref 4.22–5.81)
RDW: 13.6 % (ref 11.5–15.5)
WBC: 7.2 10*3/uL (ref 4.0–10.5)

## 2016-06-25 LAB — LIPID PANEL
CHOL/HDL RATIO: 3
Cholesterol: 160 mg/dL (ref 0–200)
HDL: 53.2 mg/dL (ref >39.00)
LDL Cholesterol: 87 mg/dL (ref 0–99)
NONHDL: 107.03
Triglycerides: 99 mg/dL (ref 0.0–149.0)
VLDL: 19.8 mg/dL (ref 0.0–40.0)

## 2016-06-25 LAB — HEPATIC FUNCTION PANEL
ALBUMIN: 4.4 g/dL (ref 3.5–5.2)
ALK PHOS: 55 U/L (ref 39–117)
ALT: 23 U/L (ref 0–53)
AST: 25 U/L (ref 0–37)
BILIRUBIN DIRECT: 0.2 mg/dL (ref 0.0–0.3)
TOTAL PROTEIN: 6.5 g/dL (ref 6.0–8.3)
Total Bilirubin: 1.4 mg/dL — ABNORMAL HIGH (ref 0.2–1.2)

## 2016-06-25 LAB — POCT URINALYSIS DIPSTICK
Bilirubin, UA: NEGATIVE
GLUCOSE UA: NEGATIVE
Ketones, UA: NEGATIVE
LEUKOCYTES UA: NEGATIVE
NITRITE UA: NEGATIVE
Protein, UA: NEGATIVE
RBC UA: NEGATIVE
Spec Grav, UA: 1.015
UROBILINOGEN UA: 0.2
pH, UA: 7

## 2016-06-25 LAB — TSH: TSH: 2.06 u[IU]/mL (ref 0.35–4.50)

## 2016-06-25 LAB — PSA: PSA: 1.52 ng/mL (ref 0.10–4.00)

## 2016-06-25 MED ORDER — TADALAFIL 5 MG PO TABS: 5.0000 mg | ORAL_TABLET | Freq: Every day | ORAL | 3 refills | Status: DC | PRN

## 2016-06-25 MED ORDER — ATORVASTATIN CALCIUM 20 MG PO TABS: ORAL_TABLET | ORAL | 3 refills | Status: DC

## 2016-06-25 MED ORDER — TADALAFIL 5 MG PO TABS: 5.0000 mg | ORAL_TABLET | Freq: Every day | ORAL | 6 refills | Status: DC | PRN

## 2016-06-25 MED ORDER — NIACIN ER (ANTIHYPERLIPIDEMIC) 1000 MG PO TBCR: EXTENDED_RELEASE_TABLET | ORAL | 3 refills | Status: DC

## 2016-06-25 MED ORDER — ATENOLOL 25 MG PO TABS: ORAL_TABLET | ORAL | 3 refills | Status: DC

## 2016-06-25 NOTE — Progress Notes (Signed)
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 At one juncture he had a cardioversion because of A. flutter. He did wellness had no complications. His pulse runs in the low 70s BP 114/80. He is not lightheaded when he stands up.  He takes Lipitor 20 mg daily along with a baby aspirin for history of hyperlipidemia. Will check labs today. He also takes Niaspan 100 mg daily  He takes over-the-counter fish oil omega-3 and vitamins. He does have some postnasal drip especially in the morning.  He had his left total hip replacement done by Dr. Desmond Dike. He's done well and had no complications. He does have a half a to an inch leg length discrepancy. He wears a lift in his right shoe.  Vaccinations up-to-date  He has a history of BPH with nocturia. He would like to try the daily Cialis.  He gets routine eye care, dental care, colonoscopy 2015 showed a polyp or 2 does not recall how many however they were the type of polyps they've asked him come back in 3 years for follow-up.  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  14 point review of systems reviewed and otherwise negative  Physical examination.vs  BP 114/80   Pulse 76   Temp 98.4 F (36.9 C)   Ht 6\' 2"  (1.88 m)   Wt 184 lb 11.2 oz (83.8 kg)   BMI 23.71 kg/m  Examination of the HEENT were negative neck was supple no adenopathy thyroid normal no carotid bruits cardiopulmonary exam normal abdominal exam normal genitalia normal circumcised male rectum normal stool guaiac-negative prostate 2+ smooth nonnodular BPH. Extremities normal skin no peripheral pulses for scar left knee from previous total knee replacement scar left hip from recent left hip replacement.  Impression #1 hyperlipidemia....... continue Lipitor  and aspirin check labs  History of palpitations and atypical a flutter which required cardioversion...Marland KitchenMarland KitchenMarland Kitchen sinus rhythm since that time. EKG was done to document that indeed it was okay today and no changes from previous cardiograms.  #3 BPH.... Check labs...Marland KitchenMarland KitchenMarland Kitchen trial of daily Cialis 5 mg.   Number for allergic rhinitis...........Marland Kitchen Zyrtec plain daily at bedtime  #5 status post left knee replacement  #6 status post left hip replacement.Marland Kitchen

## 2016-06-25 NOTE — Patient Instructions (Signed)
Continue current medication diet and exercise program  Cialis 5 mg.......Marland Kitchen 1 tablet at bedtime  Follow-up in one year sooner if any problems  Labs today,,,,,,,,, we will call you if there is anything abnormal

## 2016-06-25 NOTE — Progress Notes (Signed)
Pre visit review using our clinic review tool, if applicable. No additional management support is needed unless otherwise documented below in the visit note. 

## 2016-06-30 ENCOUNTER — Other Ambulatory Visit: Payer: Self-pay | Admitting: Family Medicine

## 2016-06-30 DIAGNOSIS — E7800 Pure hypercholesterolemia, unspecified: Secondary | ICD-10-CM

## 2016-06-30 DIAGNOSIS — I10 Essential (primary) hypertension: Secondary | ICD-10-CM

## 2016-06-30 MED ORDER — ATENOLOL 25 MG PO TABS: ORAL_TABLET | ORAL | 3 refills | Status: DC

## 2016-06-30 MED ORDER — NIACIN ER (ANTIHYPERLIPIDEMIC) 1000 MG PO TBCR: EXTENDED_RELEASE_TABLET | ORAL | 3 refills | Status: DC

## 2016-06-30 MED ORDER — ATORVASTATIN CALCIUM 20 MG PO TABS: ORAL_TABLET | ORAL | 3 refills | Status: DC

## 2016-06-30 NOTE — Telephone Encounter (Signed)
Pt's rx were sent to wrong pharmacy  Please resend atenolol (TENORMIN) 25 MG tablet atorvastatin (LIPITOR) 20 MG tablet niacin (NIASPAN) 1000 MG CR tablet  To [facility] 90 day  Also, pt states he received a letter from [facility] stating they will be out of the  atenolol (TENORMIN) 25 MG tablet until Dec 1.  So pt may need to pick up the rx from [facility] that was sent for this.

## 2016-06-30 NOTE — Telephone Encounter (Signed)
Rx's resent to [facility] per pt request. Nothing further needed.

## 2016-07-02 ENCOUNTER — Telehealth: Payer: Self-pay

## 2016-07-02 NOTE — Telephone Encounter (Signed)
Pt called wanting to know his lab results from the Cystic Fibrosis testing. He also states that he has not received an email about his other lab tests being viewable on MyChart. I advised pt that I do believe a letter came in with his CF test results and that Dr Sherren Mocha has this. He wanted it faxed to him. I advised that I cannot release any results until Dr Sherren Mocha has reviewed them. Also advised pt that his other labs should be viewable under the results tab in MyChart. Pt will look for these.   Dr. Sherren Mocha - Please call pt ASAP with test results.

## 2016-08-06 ENCOUNTER — Ambulatory Visit (INDEPENDENT_AMBULATORY_CARE_PROVIDER_SITE_OTHER): Payer: Medicare Other | Admitting: Internal Medicine

## 2016-08-06 ENCOUNTER — Ambulatory Visit
Admission: RE | Admit: 2016-08-06 | Discharge: 2016-08-06 | Disposition: A | Discharge status: Home / Self Care | Payer: Medicare Other | Source: Ambulatory Visit | Attending: Internal Medicine | Admitting: Internal Medicine

## 2016-08-06 ENCOUNTER — Encounter: Payer: Self-pay | Admitting: Internal Medicine

## 2016-08-06 VITALS — BP 118/80 | HR 64 | Ht 74.0 in | Wt 188.2 lb

## 2016-08-06 DIAGNOSIS — R05 Cough: Secondary | ICD-10-CM | POA: Diagnosis not present

## 2016-08-06 DIAGNOSIS — I10 Essential (primary) hypertension: Secondary | ICD-10-CM | POA: Diagnosis not present

## 2016-08-06 DIAGNOSIS — I484 Atypical atrial flutter: Secondary | ICD-10-CM

## 2016-08-06 DIAGNOSIS — R059 Cough, unspecified: Secondary | ICD-10-CM

## 2016-08-06 NOTE — Progress Notes (Signed)
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 He is a pleasant 71 yo man with a h/o remote atrial fib, who had been in NSR for years and developed atrial fib/flutter and underwent TEE guided cardioversion approximately one year ago.. At that time he was found to have LV dysfunction and was placed on an ACE inhibitor in addition to his beta blocker. He feels well. No recurrent symptoms of atrial fib/flutter. No CHF symptoms. He has stopped his ACE inhibitor. He was hypotensive on this medication. The patient has undergone repeat 2-D echo, demonstrating persistent normalization of his left ventricular function, now over one year since returning to normal rhythm. He has had no recurrent symptomatic atrial fibrillation. He remains active, and has had no limitation to activity. He notes feeling poorly in the morning and notably has some coughing and nasal congestion. No fever or chills.  No Known Allergies   Current Outpatient Prescriptions  Medication Sig Dispense Refill  . aspirin 81 MG tablet Take 81 mg by mouth daily.    Marland Kitchen atenolol (TENORMIN) 25 MG tablet Take one half tablet by mouth daily 50 tablet 3  . atorvastatin (LIPITOR) 20 MG tablet Take 1 tablet by mouth  daily 100 tablet 3  . Multiple Vitamins-Minerals (CENTRUM SILVER PO) Take 1 capsule by mouth daily.    . niacin (NIASPAN) 1000 MG CR tablet Take 1 tablet (1,000 mg  total) by mouth at bedtime. 100 tablet 3  . Omega-3 Fatty Acids (FISH OIL) 1000 MG CAPS Take 1 capsule by mouth 2 (two) times daily.    . tadalafil (CIALIS) 5 MG tablet Take 1 tablet (5 mg total) by mouth daily as needed for erectile dysfunction. 50 tablet 6   No current facility-administered medications for this visit.      Past Medical History:  Diagnosis Date  . A-fib ([place])   . ALLERGIC RHINITIS   . Arthritis   . Colon polyp   . Dysrhythmia   . Erectile dysfunction   . History of bronchitis as a child   . Hyperlipemia   . Hypertension   . S/P lateral  meniscectomy of left knee     ROS:   All systems reviewed and negative except as noted in the HPI.   Past Surgical History:  Procedure Laterality Date  . CARDIOVERSION N/A 03/17/2014   Procedure: CARDIOVERSION;  Surgeon: Sanda Klein, MD;  Location: [facility];  Service: Cardiovascular;  Laterality: N/A;  . colonscopy     . Multiple Knee,elbow and foot surgeries    . neuroma left foot,2010, Dr. Anne Fu    . TEE WITHOUT CARDIOVERSION N/A 03/17/2014   Procedure: TRANSESOPHAGEAL ECHOCARDIOGRAM (TEE);  Surgeon: Sanda Klein, MD;  Location: [facility];  Service: Cardiovascular;  Laterality: N/A;  . TOTAL HIP ARTHROPLASTY Left 07/18/2015   Procedure: LEFT TOTAL HIP ARTHROPLASTY ANTERIOR APPROACH;  Surgeon: Gaynelle Arabian, MD;  Location: [facility];  Service: Orthopedics;  Laterality: Left;     Family History  Problem Relation Age of Onset  . Coronary artery disease    . Prostate cancer    . Stroke       Social History   Social History  . Marital status: Married    Spouse name: N/A  . Number of children: N/A  . Years of education: N/A   Occupational History  . retired Chief Financial Officer   . former smoker   . alocohol use-yes   . Drug use-no   . regular exercise-yes    Social  History Main Topics  . Smoking status: Former Smoker    Packs/day: 0.50    Years: 10.00    Types: Cigarettes    Quit date: 08/05/1987  . Smokeless tobacco: Never Used  . Alcohol use 0.0 oz/week     Comment: glass of wine daily   . Drug use: No  . Sexual activity: Not on file   Other Topics Concern  . Not on file   Social History Narrative  . No narrative on file     BP 118/80   Pulse 64   Ht 6\' 2"  (1.88 m)   Wt 188 lb 3.2 oz (85.4 kg)   BMI 24.16 kg/m   Physical Exam:  Well appearing 71 yo man, NAD HEENT: Unremarkable Neck:  6 cm JVD, no thyromegally Back:  No CVA tenderness Lungs:  Clear with no wheezes HEART:  Regular rate rhythm, no murmurs, no rubs, no clicks Abd:   soft, positive bowel sounds, no organomegally, no rebound, no guarding Ext:  2 plus pulses, no edema, no cyanosis, no clubbing Skin:  No rashes no nodules Neuro:  CN II through XII intact, motor grossly intact  EKG -nsr with right bundle branch block   Assess/Plan: 1. PAF - he is maintaining NSR very nicely. He will continue his low dose beta blocker and ASA 2. Cough - will check a CXR 3. LV dysfunction - he has no evidence of any worsening. He has had 2 echos in a row with normalization of his LV function.  4. Dyslipidemia - he will continue statin therapy.  Mikle Bosworth.D.

## 2016-08-06 NOTE — Patient Instructions (Addendum)
Medication Instructions:  Your physician recommends that you continue on your current medications as directed. Please refer to the Current Medication list given to you today.   Labwork: None Ordered   Testing/Procedures: Chest x-ray    Follow-Up: Your physician wants you to follow-up in: 1 year with Dr. Lovena Le (unless changes occur).  You will receive a reminder letter in the mail two months in advance. If you don't receive a letter, please call our office to schedule the follow-up appointment.    Any Other Special Instructions Will Be Listed Below (If Applicable).     If you need a refill on your cardiac medications before your next appointment, please call your pharmacy.

## 2016-08-07 DIAGNOSIS — Z471 Aftercare following joint replacement surgery: Secondary | ICD-10-CM | POA: Diagnosis not present

## 2016-08-07 DIAGNOSIS — Z96642 Presence of left artificial hip joint: Secondary | ICD-10-CM | POA: Diagnosis not present

## 2016-09-03 DIAGNOSIS — H2513 Age-related nuclear cataract, bilateral: Secondary | ICD-10-CM | POA: Diagnosis not present

## 2016-11-07 ENCOUNTER — Telehealth: Payer: Self-pay

## 2016-11-07 NOTE — Telephone Encounter (Signed)
Outreach to Garrett Vargas to schedule AWV  LVM to call the office back or my direct line at [phone]

## 2017-01-19 ENCOUNTER — Other Ambulatory Visit: Payer: Self-pay | Admitting: Dermatology

## 2017-01-19 DIAGNOSIS — C44612 Basal cell carcinoma of skin of right upper limb, including shoulder: Secondary | ICD-10-CM | POA: Diagnosis not present

## 2017-01-19 DIAGNOSIS — L814 Other melanin hyperpigmentation: Secondary | ICD-10-CM | POA: Diagnosis not present

## 2017-01-19 DIAGNOSIS — D225 Melanocytic nevi of trunk: Secondary | ICD-10-CM | POA: Diagnosis not present

## 2017-01-19 DIAGNOSIS — D1801 Hemangioma of skin and subcutaneous tissue: Secondary | ICD-10-CM | POA: Diagnosis not present

## 2017-01-19 DIAGNOSIS — L570 Actinic keratosis: Secondary | ICD-10-CM | POA: Diagnosis not present

## 2017-01-19 DIAGNOSIS — L821 Other seborrheic keratosis: Secondary | ICD-10-CM | POA: Diagnosis not present

## 2017-02-23 ENCOUNTER — Telehealth: Payer: Self-pay | Admitting: Family Medicine

## 2017-02-23 NOTE — Telephone Encounter (Signed)
Spoke to the pt.  He wanted to know if he still needed a yearly with Dr. Sherren Mocha since he was having a wellness visit with Manuela Schwartz.  Informed pt he does still need yearly and will have lab work done at that visit.  Scheduled him to see Dr. Sherren Mocha on 06/29/17.  He has appt with Manuela Schwartz on 06/23/17.  He will come fasting to see Dr. Sherren Mocha.

## 2017-02-23 NOTE — Telephone Encounter (Signed)
Pt returned your call.  

## 2017-03-03 ENCOUNTER — Telehealth: Payer: Self-pay

## 2017-03-03 NOTE — Telephone Encounter (Signed)
Call placed to Pt.  Notified to pick up 3 weeks samples of Xarelto to take [place] at front desk.  Left this nurse name and # if any questions.  Medication taken to front desk for pick up.

## 2017-03-04 DIAGNOSIS — Z85828 Personal history of other malignant neoplasm of skin: Secondary | ICD-10-CM | POA: Diagnosis not present

## 2017-03-04 DIAGNOSIS — L905 Scar conditions and fibrosis of skin: Secondary | ICD-10-CM | POA: Diagnosis not present

## 2017-04-06 IMAGING — DX DG PORTABLE PELVIS
1 series · 1 of 1 positions shown · non-contrast
Comparison: None.

CLINICAL DATA: Postoperative evaluation left hip replacement

EXAM:
PORTABLE PELVIS 1-2 VIEWS

[pelvis ap]
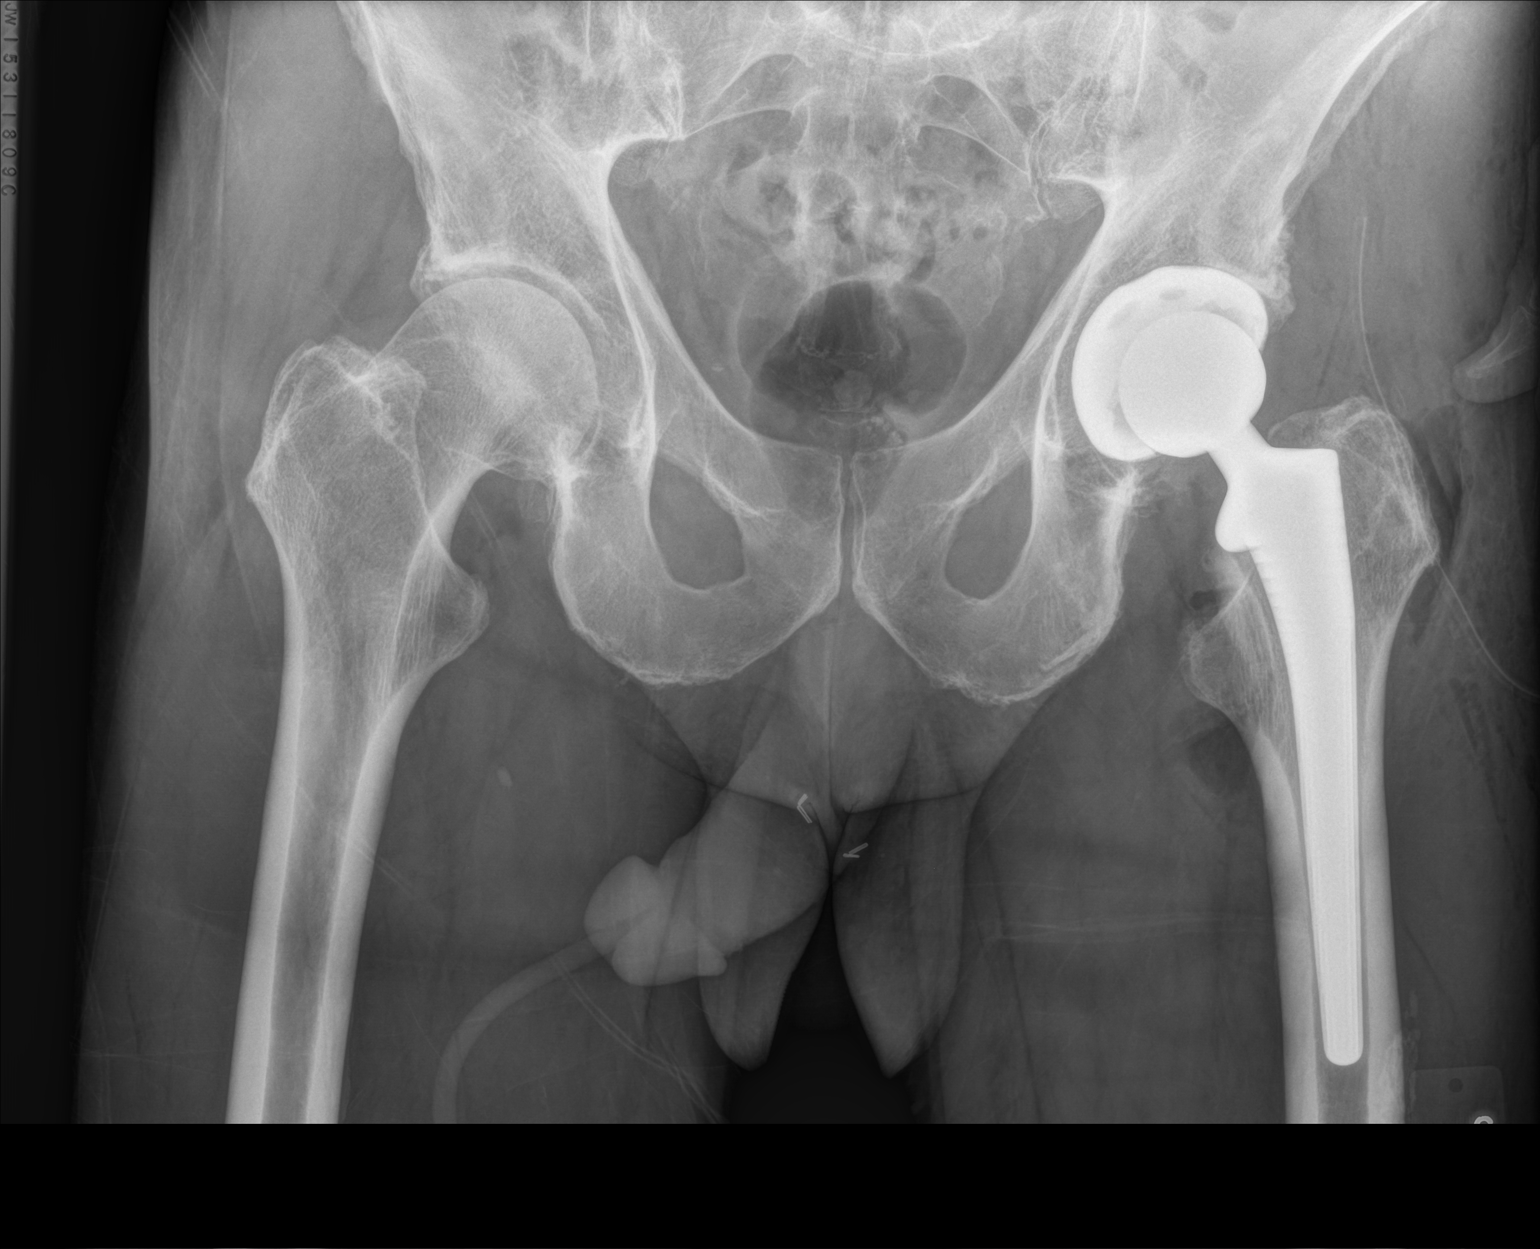

[1 of 1 positions shown; findings below may reference images not displayed]

FINDINGS: Single image shows hip replacement on the left with components well
position and no radiographically detectable complication. Soft
tissue drain is in place. Mild osteoarthritis of the right hip.
IMPRESSION: Good appearance following total hip replacement on the left.

## 2017-04-24 ENCOUNTER — Encounter: Payer: Self-pay | Admitting: Family Medicine

## 2017-04-25 ENCOUNTER — Other Ambulatory Visit: Payer: Self-pay | Admitting: Family Medicine

## 2017-04-25 DIAGNOSIS — E7800 Pure hypercholesterolemia, unspecified: Secondary | ICD-10-CM

## 2017-05-21 DIAGNOSIS — K635 Polyp of colon: Secondary | ICD-10-CM | POA: Diagnosis not present

## 2017-05-21 DIAGNOSIS — Z86010 Personal history of colonic polyps: Secondary | ICD-10-CM | POA: Diagnosis not present

## 2017-05-21 DIAGNOSIS — K5730 Diverticulosis of large intestine without perforation or abscess without bleeding: Secondary | ICD-10-CM | POA: Diagnosis not present

## 2017-05-21 LAB — HM COLONOSCOPY

## 2017-05-26 DIAGNOSIS — K635 Polyp of colon: Secondary | ICD-10-CM | POA: Diagnosis not present

## 2017-06-03 DIAGNOSIS — L905 Scar conditions and fibrosis of skin: Secondary | ICD-10-CM | POA: Diagnosis not present

## 2017-06-03 DIAGNOSIS — Z85828 Personal history of other malignant neoplasm of skin: Secondary | ICD-10-CM | POA: Diagnosis not present

## 2017-06-19 NOTE — Progress Notes (Signed)
Subjective:   Garrett Vargas is a 71 y.o. male who presents for Medicare Annual (Subsequent) preventive examination.  The Patient was informed that the wellness visit is to identify future health risk and educate and initiate measures that can reduce risk for increased disease through the lifespan.    Annual Wellness Assessment  Reports health as   Dr. Megan Salon  Oral and maxillary surgery   Married 3 sons  Middle one lives here One is a Engineer, mining One professional soccer player All coming in for thanksgiving One [place] dtr "on the way"   Preventive Screening -Counseling & Management  Medicare Annual Preventive Care Visit - Subsequent Last OV Dr. Sherren Mocha 07/02/2016 Has apt to see Dr. Sherren Mocha on 06/29/2017  Will have labs at that time Dr. [name] cardiology 08/2016   Health Maintenance Due  Topic Date Due  . Hepatitis C Screening  03/05/1946  . INFLUENZA VACCINE  03/04/2017   PSA 1.52 on 06/2016 Colonoscopy 11/2013 and due on 11/2023  Educate regarding the Shingrix   Had flu vaccine   Flu vaccine at Dr. Buelah Manis; in [place]  Oct 2018    VS reviewed;   Tobacco use 5 pack years; quit 71 yo  Discussed AAA screening due to history; Will refer today to GI Imaging   Diet  Breakfast ; 2 eggo Lunch varies;  Supper; some type of green or salad   BMI 24   Exercise Total hip 2016  No more running Trainer x 2 per week Legs to shoulders, hip and chest Aerobic; 30 minutes  Sports played basketball and base ball Knee hurt went from tennis to golf Once a week  Likes hunting -  Enjoying his life   Dental - up to date   Stressors: no stressors   Sleep patterns: sleeps well   Pain-  No pain     Cardiac Risk Factors Addressed Family hx not reported Hyperlipidemia - chol/hdl 3; chol 160; hdl 53; LDL 87 and trig 99  Diabetes neg A1c 5.5   Advanced Directives Patient Care Team: Dorena Cookey, MD as PCP - General  Cardiac Risk Factors include: advanced age  (>65men, >26 women);dyslipidemia;hypertension;family history of premature cardiovascular disease  Mother died of MI in her 7's      Objective:     Vitals: BP 104/70   Pulse 75   Ht 6\' 2"  (1.88 m)   Wt 191 lb (86.6 kg)   SpO2 95%   BMI 24.52 kg/m   Body mass index is 24.52 kg/m.   Tobacco Social History   Tobacco Use  Smoking Status Former Smoker  . Packs/day: 0.50  . Years: 10.00  . Pack years: 5.00  . Types: Cigarettes  . Last attempt to quit: 08/05/1987  . Years since quitting: 29.9  Smokeless Tobacco Never Used     Counseling given: Yes   Past Medical History:  Diagnosis Date  . A-fib ([place])   . ALLERGIC RHINITIS   . Arthritis   . Colon polyp   . Dysrhythmia   . Erectile dysfunction   . History of bronchitis as a child   . Hyperlipemia   . Hypertension   . S/P lateral meniscectomy of left knee    Past Surgical History:  Procedure Laterality Date  . CARDIOVERSION N/A 03/17/2014   Performed by Sanda Klein, MD at [place]  . colonscopy     . LEFT TOTAL HIP ARTHROPLASTY ANTERIOR APPROACH Left 07/18/2015   Performed by Gaynelle Arabian, MD  at [facility]  . Multiple Knee,elbow and foot surgeries    . neuroma left foot,2010, Dr. Anne Fu    . TRANSESOPHAGEAL ECHOCARDIOGRAM (TEE) N/A 03/17/2014   Performed by Sanda Klein, MD at [facility]   Family History  Problem Relation Age of Onset  . Coronary artery disease Unknown   . Prostate cancer Unknown   . Stroke Unknown   . Heart disease Mother    Social History   Substance and Sexual Activity  Sexual Activity Not on file    Outpatient Encounter Medications as of 06/23/2017  Medication Sig  . aspirin 81 MG tablet Take 81 mg by mouth daily.  Marland Kitchen atenolol (TENORMIN) 25 MG tablet Take one half tablet by mouth daily  . atorvastatin (LIPITOR) 20 MG tablet TAKE 1 TABLET BY MOUTH  DAILY  . Multiple Vitamins-Minerals (CENTRUM SILVER PO) Take 1 capsule by mouth daily.  . niacin (NIASPAN) 1000 MG  CR tablet TAKE 1 TABLET BY MOUTH AT  BEDTIME  . Omega-3 Fatty Acids (FISH OIL) 1000 MG CAPS Take 1 capsule by mouth 2 (two) times daily.  . tadalafil (CIALIS) 5 MG tablet Take 1 tablet (5 mg total) by mouth daily as needed for erectile dysfunction.   No facility-administered encounter medications on file as of 06/23/2017.     Activities of Daily Living In your present state of health, do you have any difficulty performing the following activities: 06/23/2017  Hearing? N  Vision? N  Difficulty concentrating or making decisions? N  Walking or climbing stairs? N  Dressing or bathing? N  Doing errands, shopping? N  Preparing Food and eating ? N  Using the Toilet? N  In the past six months, have you accidently leaked urine? N  Do you have problems with loss of bowel control? N  Managing your Medications? N  Managing your Finances? N  Housekeeping or managing your Housekeeping? N  Some recent data might be hidden    Patient Care Team: Dorena Cookey, MD as PCP - General    Assessment:     Exercise Activities and Dietary recommendations Current Exercise Habits: Home exercise routine;Structured exercise class, Time (Minutes): 60, Frequency (Times/Week): 4, Weekly Exercise (Minutes/Week): 240, Intensity: Moderate  Goals    . patient     To maintain your health and exercise  Take time to travel and enjoy your time        Fall Risk Fall Risk  06/23/2017 05/09/2015 04/06/2014  Falls in the past year? No No No   Depression Screen PHQ 2/9 Scores 06/23/2017 05/09/2015 04/06/2014  PHQ - 2 Score 0 0 0     Cognitive Function   Ad8 score is 0 no issues   Immunization History  Administered Date(s) Administered  . Hepatitis A 07/02/2012, 01/10/2013  . Influenza Nasal 05/04/2017  . Influenza Split 04/27/2012  . Influenza Whole 05/04/2009  . Influenza-Unspecified 05/04/2014, 05/07/2015, 04/28/2016  . PPD Test 04/16/2011, 04/27/2012, 04/27/2013, 04/11/2014  . Pneumococcal  Conjugate-13 04/06/2014  . Pneumococcal Polysaccharide-23 12/10/2010  . Td 01/08/2005  . Tdap 05/09/2015  . Zoster 08/13/2009   Screening Tests Health Maintenance  Topic Date Due  . Hepatitis C Screening  [birth year]  . INFLUENZA VACCINE  03/04/2017  . COLONOSCOPY  11/29/2023  . TETANUS/TDAP  05/08/2025  . PNA vac Low Risk Adult  Completed      Plan:     PCP Notes Health Maintenance Flu vaccine in October and documented  Agreed to hep c screening at his next blood  draw  Educated regarding the Shingrix    Abnormal Screens  None   Referrals  Referred to GI at [facility] for AAA screening due to tobacco history   Patient concerns; None; enjoying his life; active  Weight training involves cardio   Nurse Concerns; Discussed limit of 2 glasses of wine or less No issues with sleep or return of Atrial fib   Next PCP apt 11/26 at 9;30   I have personally reviewed and noted the following in the patient's chart:   . Medical and social history . Use of alcohol, tobacco or illicit drugs  . Current medications and supplements . Functional ability and status . Nutritional status . Physical activity . Advanced directives . List of other physicians . Hospitalizations, surgeries, and ER visits in previous 12 months . Vitals . Screenings to include cognitive, depression, and falls . Referrals and appointments  In addition, I have reviewed and discussed with patient certain preventive protocols, quality metrics, and best practice recommendations. A written personalized care plan for preventive services as well as general preventive health recommendations were provided to patient.     Wynetta Fines, RN  06/23/2017

## 2017-06-23 ENCOUNTER — Ambulatory Visit (INDEPENDENT_AMBULATORY_CARE_PROVIDER_SITE_OTHER): Payer: Medicare Other

## 2017-06-23 VITALS — BP 104/70 | HR 75 | Ht 74.0 in | Wt 191.0 lb

## 2017-06-23 DIAGNOSIS — Z0000 Encounter for general adult medical examination without abnormal findings: Secondary | ICD-10-CM

## 2017-06-23 DIAGNOSIS — Z87891 Personal history of nicotine dependence: Secondary | ICD-10-CM

## 2017-06-23 DIAGNOSIS — Z1159 Encounter for screening for other viral diseases: Secondary | ICD-10-CM | POA: Diagnosis not present

## 2017-06-23 NOTE — Patient Instructions (Addendum)
[name] , Thank you for taking time to come for your Medicare Wellness Visit. I appreciate your ongoing commitment to your health goals. Please review the following plan we discussed and let me know if I can assist you in the future.   Shingrix is a vaccine for the prevention of Shingles in Adults 50 and older.  If you are on Medicare, you can request a prescription from your doctor to be filled at a pharmacy.  Please check with your benefits regarding applicable copays or out of pocket expenses.  The Shingrix is given in 2 vaccines approx 8 weeks apart. You must receive the 2nd dose prior to 6 months from receipt of the first.   Will have hep c when you have your next blood draw   Will discuss the AAA screen with Dr. Sherren Mocha  (the patient informed order placed and he will receive a call to schedule at his convenience )   These are the goals we discussed: continue to stay healthy  Goals    None      This is a list of the screening recommended for you and due dates:  Health Maintenance  Topic Date Due  .  Hepatitis C: One time screening is recommended by Center for Disease Control  (CDC) for  adults born from [birth year] through 1965.   1945/08/18  . Flu Shot  03/04/2017  . Colon Cancer Screening  11/29/2023  . Tetanus Vaccine  05/08/2025  . Pneumonia vaccines  Completed   Prevention of falls: Remove rugs or any tripping hazards in the home Use Non slip mats in bathtubs and showers Placing grab bars next to the toilet and or shower Placing handrails on both sides of the stair way Adding extra lighting in the home.   Personal safety issues reviewed:  1. Consider starting a community watch program per [facility] 2.  Changes batteries is smoke detector and/or carbon monoxide detector  3.  If you have firearms; keep them in a safe place 4.  Wear protection when in the sun; Always wear sunscreen or a hat; It is good to have your doctor check your skin annually or review any  new areas of concern 5. Driving safety; Keep in the right lane; stay 3 car lengths behind the car in front of you on the highway; look 3 times prior to pulling out; carry your cell phone everywhere you go!    Learn about the Yellow Dot program:  The program allows first responders at your emergency to have access to who your physician is, as well as your medications and medical conditions.  Citizens requesting the Yellow Dot Packages should contact Master Corporal Nunzio Cobbs at the [facility] [phone] for the first week of the program and beginning the week after Easter citizens should contact their Scientist, physiological.       Fall Prevention in the Home Falls can cause injuries. They can happen to people of all ages. There are many things you can do to make your home safe and to help prevent falls. What can I do on the outside of my home?  Regularly fix the edges of walkways and driveways and fix any cracks.  Remove anything that might make you trip as you walk through a door, such as a raised step or threshold.  Trim any bushes or trees on the path to your home.  Use bright outdoor lighting.  Clear any walking paths of anything that might  make someone trip, such as rocks or tools.  Regularly check to see if handrails are loose or broken. Make sure that both sides of any steps have handrails.  Any raised decks and porches should have guardrails on the edges.  Have any leaves, snow, or ice cleared regularly.  Use sand or salt on walking paths during winter.  Clean up any spills in your garage right away. This includes oil or grease spills. What can I do in the bathroom?  Use night lights.  Install grab bars by the toilet and in the tub and shower. Do not use towel bars as grab bars.  Use non-skid mats or decals in the tub or shower.  If you need to sit down in the shower, use a plastic, non-slip stool.  Keep the floor dry. Clean up  any water that spills on the floor as soon as it happens.  Remove soap buildup in the tub or shower regularly.  Attach bath mats securely with double-sided non-slip rug tape.  Do not have throw rugs and other things on the floor that can make you trip. What can I do in the bedroom?  Use night lights.  Make sure that you have a light by your bed that is easy to reach.  Do not use any sheets or blankets that are too big for your bed. They should not hang down onto the floor.  Have a firm chair that has side arms. You can use this for support while you get dressed.  Do not have throw rugs and other things on the floor that can make you trip. What can I do in the kitchen?  Clean up any spills right away.  Avoid walking on wet floors.  Keep items that you use a lot in easy-to-reach places.  If you need to reach something above you, use a strong step stool that has a grab bar.  Keep electrical cords out of the way.  Do not use floor polish or wax that makes floors slippery. If you must use wax, use non-skid floor wax.  Do not have throw rugs and other things on the floor that can make you trip. What can I do with my stairs?  Do not leave any items on the stairs.  Make sure that there are handrails on both sides of the stairs and use them. Fix handrails that are broken or loose. Make sure that handrails are as long as the stairways.  Check any carpeting to make sure that it is firmly attached to the stairs. Fix any carpet that is loose or worn.  Avoid having throw rugs at the top or bottom of the stairs. If you do have throw rugs, attach them to the floor with carpet tape.  Make sure that you have a light switch at the top of the stairs and the bottom of the stairs. If you do not have them, ask someone to add them for you. What else can I do to help prevent falls?  Wear shoes that: ? Do not have high heels. ? Have rubber bottoms. ? Are comfortable and fit you well. ? Are  closed at the toe. Do not wear sandals.  If you use a stepladder: ? Make sure that it is fully opened. Do not climb a closed stepladder. ? Make sure that both sides of the stepladder are locked into place. ? Ask someone to hold it for you, if possible.  Clearly mark and make sure that you can see: ?  Any grab bars or handrails. ? First and last steps. ? Where the edge of each step is.  Use tools that help you move around (mobility aids) if they are needed. These include: ? Canes. ? Walkers. ? Scooters. ? Crutches.  Turn on the lights when you go into a dark area. Replace any light bulbs as soon as they burn out.  Set up your furniture so you have a clear path. Avoid moving your furniture around.  If any of your floors are uneven, fix them.  If there are any pets around you, be aware of where they are.  Review your medicines with your doctor. Some medicines can make you feel dizzy. This can increase your chance of falling. Ask your doctor what other things that you can do to help prevent falls. This information is not intended to replace advice given to you by your health care provider. Make sure you discuss any questions you have with your health care provider. Document Released: 05/17/2009 Document Revised: 12/27/2015 Document Reviewed: 08/25/2014 Elsevier Interactive Patient Education  2018 [place] Maintenance, Male A healthy lifestyle and preventive care is important for your health and wellness. Ask your health care provider about what schedule of regular examinations is right for you. What should I know about weight and diet? Eat a Healthy Diet  Eat plenty of vegetables, fruits, whole grains, low-fat dairy products, and lean protein.  Do not eat a lot of foods high in solid fats, added sugars, or salt.  Maintain a Healthy Weight Regular exercise can help you achieve or maintain a healthy weight. You should:  Do at least 150 minutes of exercise each  week. The exercise should increase your heart rate and make you sweat (moderate-intensity exercise).  Do strength-training exercises at least twice a week.  Watch Your Levels of Cholesterol and Blood Lipids  Have your blood tested for lipids and cholesterol every 5 years starting at 71 years of age. If you are at high risk for heart disease, you should start having your blood tested when you are 71 years old. You may need to have your cholesterol levels checked more often if: ? Your lipid or cholesterol levels are high. ? You are older than 71 years of age. ? You are at high risk for heart disease.  What should I know about cancer screening? Many types of cancers can be detected early and may often be prevented. Lung Cancer  You should be screened every year for lung cancer if: ? You are a current smoker who has smoked for at least 30 years. ? You are a former smoker who has quit within the past 15 years.  Talk to your health care provider about your screening options, when you should start screening, and how often you should be screened.  Colorectal Cancer  Routine colorectal cancer screening usually begins at 71 years of age and should be repeated every 5-10 years until you are 71 years old. You may need to be screened more often if early forms of precancerous polyps or small growths are found. Your health care provider may recommend screening at an earlier age if you have risk factors for colon cancer.  Your health care provider may recommend using home test kits to check for hidden blood in the stool.  A small camera at the end of a tube can be used to examine your colon (sigmoidoscopy or colonoscopy). This checks for the earliest forms of colorectal cancer.  Prostate and  Testicular Cancer  Depending on your age and overall health, your health care provider may do certain tests to screen for prostate and testicular cancer.  Talk to your health care provider about any symptoms or  concerns you have about testicular or prostate cancer.  Skin Cancer  Check your skin from head to toe regularly.  Tell your health care provider about any new moles or changes in moles, especially if: ? There is a change in a mole's size, shape, or color. ? You have a mole that is larger than a pencil eraser.  Always use sunscreen. Apply sunscreen liberally and repeat throughout the day.  Protect yourself by wearing long sleeves, pants, a wide-brimmed hat, and sunglasses when outside.  What should I know about heart disease, diabetes, and high blood pressure?  If you are 10-26 years of age, have your blood pressure checked every 3-5 years. If you are 53 years of age or older, have your blood pressure checked every year. You should have your blood pressure measured twice-once when you are at a hospital or clinic, and once when you are not at a hospital or clinic. Record the average of the two measurements. To check your blood pressure when you are not at a hospital or clinic, you can use: ? An automated blood pressure machine at a pharmacy. ? A home blood pressure monitor.  Talk to your health care provider about your target blood pressure.  If you are between 72-49 years old, ask your health care provider if you should take aspirin to prevent heart disease.  Have regular diabetes screenings by checking your fasting blood sugar level. ? If you are at a normal weight and have a low risk for diabetes, have this test once every three years after the age of 35. ? If you are overweight and have a high risk for diabetes, consider being tested at a younger age or more often.  A one-time screening for abdominal aortic aneurysm (AAA) by ultrasound is recommended for men aged 45-75 years who are current or former smokers. What should I know about preventing infection? Hepatitis B If you have a higher risk for hepatitis B, you should be screened for this virus. Talk with your health care provider  to find out if you are at risk for hepatitis B infection. Hepatitis C Blood testing is recommended for:  Everyone born from [birth year] through 1965.  Anyone with known risk factors for hepatitis C.  Sexually Transmitted Diseases (STDs)  You should be screened each year for STDs including gonorrhea and chlamydia if: ? You are sexually active and are younger than 71 years of age. ? You are older than 71 years of age and your health care provider tells you that you are at risk for this type of infection. ? Your sexual activity has changed since you were last screened and you are at an increased risk for chlamydia or gonorrhea. Ask your health care provider if you are at risk.  Talk with your health care provider about whether you are at high risk of being infected with HIV. Your health care provider may recommend a prescription medicine to help prevent HIV infection.  What else can I do?  Schedule regular health, dental, and eye exams.  Stay current with your vaccines (immunizations).  Do not use any tobacco products, such as cigarettes, chewing tobacco, and e-cigarettes. If you need help quitting, ask your health care provider.  Limit alcohol intake to no more than 2 drinks per  day. One drink equals 12 ounces of beer, 5 ounces of wine, or 1 ounces of hard liquor.  Do not use street drugs.  Do not share needles.  Ask your health care provider for help if you need support or information about quitting drugs.  Tell your health care provider if you often feel depressed.  Tell your health care provider if you have ever been abused or do not feel safe at home. This information is not intended to replace advice given to you by your health care provider. Make sure you discuss any questions you have with your health care provider. Document Released: 01/17/2008 Document Revised: 03/19/2016 Document Reviewed: 04/24/2015 Elsevier Interactive Patient Education  [employer].

## 2017-06-29 ENCOUNTER — Encounter: Payer: Self-pay | Admitting: Family Medicine

## 2017-06-29 ENCOUNTER — Ambulatory Visit (INDEPENDENT_AMBULATORY_CARE_PROVIDER_SITE_OTHER): Payer: Medicare Other | Admitting: Family Medicine

## 2017-06-29 VITALS — BP 108/74 | HR 76 | Temp 98.4°F | Ht 74.0 in | Wt 187.0 lb

## 2017-06-29 DIAGNOSIS — Z0000 Encounter for general adult medical examination without abnormal findings: Secondary | ICD-10-CM

## 2017-06-29 DIAGNOSIS — R351 Nocturia: Secondary | ICD-10-CM | POA: Diagnosis not present

## 2017-06-29 DIAGNOSIS — I10 Essential (primary) hypertension: Secondary | ICD-10-CM

## 2017-06-29 DIAGNOSIS — N401 Enlarged prostate with lower urinary tract symptoms: Secondary | ICD-10-CM | POA: Diagnosis not present

## 2017-06-29 DIAGNOSIS — E7800 Pure hypercholesterolemia, unspecified: Secondary | ICD-10-CM

## 2017-06-29 DIAGNOSIS — F528 Other sexual dysfunction not due to a substance or known physiological condition: Secondary | ICD-10-CM | POA: Diagnosis not present

## 2017-06-29 LAB — POCT URINALYSIS DIPSTICK
BILIRUBIN UA: NEGATIVE
Blood, UA: NEGATIVE
GLUCOSE UA: NEGATIVE
Ketones, UA: NEGATIVE
LEUKOCYTES UA: NEGATIVE
NITRITE UA: NEGATIVE
Protein, UA: NEGATIVE
Spec Grav, UA: 1.015 (ref 1.010–1.025)
Urobilinogen, UA: 0.2 E.U./dL
pH, UA: 8 (ref 5.0–8.0)

## 2017-06-29 LAB — CBC WITH DIFFERENTIAL/PLATELET
BASOS ABS: 0 10*3/uL (ref 0.0–0.1)
Basophils Relative: 0.6 % (ref 0.0–3.0)
EOS ABS: 0.2 10*3/uL (ref 0.0–0.7)
Eosinophils Relative: 4.2 % (ref 0.0–5.0)
HCT: 48.2 % (ref 39.0–52.0)
HEMOGLOBIN: 16.1 g/dL (ref 13.0–17.0)
Lymphocytes Relative: 30.9 % (ref 12.0–46.0)
Lymphs Abs: 1.7 10*3/uL (ref 0.7–4.0)
MCHC: 33.3 g/dL (ref 30.0–36.0)
MCV: 94.9 fl (ref 78.0–100.0)
MONO ABS: 0.5 10*3/uL (ref 0.1–1.0)
Monocytes Relative: 8.2 % (ref 3.0–12.0)
Neutro Abs: 3.1 10*3/uL (ref 1.4–7.7)
Neutrophils Relative %: 56.1 % (ref 43.0–77.0)
Platelets: 215 10*3/uL (ref 150.0–400.0)
RBC: 5.08 Mil/uL (ref 4.22–5.81)
RDW: 13.4 % (ref 11.5–15.5)
WBC: 5.5 10*3/uL (ref 4.0–10.5)

## 2017-06-29 LAB — LIPID PANEL
CHOLESTEROL: 185 mg/dL (ref 0–200)
HDL: 49.9 mg/dL (ref >39.00)
LDL Cholesterol: 117 mg/dL — ABNORMAL HIGH (ref 0–99)
NonHDL: 135.25
TRIGLYCERIDES: 89 mg/dL (ref 0.0–149.0)
Total CHOL/HDL Ratio: 4
VLDL: 17.8 mg/dL (ref 0.0–40.0)

## 2017-06-29 LAB — PSA: PSA: 1.69 ng/mL (ref 0.10–4.00)

## 2017-06-29 LAB — BASIC METABOLIC PANEL
BUN: 14 mg/dL (ref 6–23)
CALCIUM: 9.6 mg/dL (ref 8.4–10.5)
CO2: 30 mEq/L (ref 19–32)
Chloride: 101 mEq/L (ref 96–112)
Creatinine, Ser: 0.79 mg/dL (ref 0.40–1.50)
GFR: 102.52 mL/min (ref >60.00)
GLUCOSE: 96 mg/dL (ref 70–99)
POTASSIUM: 4.2 meq/L (ref 3.5–5.1)
SODIUM: 138 meq/L (ref 135–145)

## 2017-06-29 LAB — TSH: TSH: 2.79 u[IU]/mL (ref 0.35–4.50)

## 2017-06-29 LAB — HEPATIC FUNCTION PANEL
ALBUMIN: 4.4 g/dL (ref 3.5–5.2)
ALT: 19 U/L (ref 0–53)
AST: 25 U/L (ref 0–37)
Alkaline Phosphatase: 46 U/L (ref 39–117)
Bilirubin, Direct: 0.2 mg/dL (ref 0.0–0.3)
TOTAL PROTEIN: 6.7 g/dL (ref 6.0–8.3)
Total Bilirubin: 1.4 mg/dL — ABNORMAL HIGH (ref 0.2–1.2)

## 2017-06-29 MED ORDER — ATENOLOL 25 MG PO TABS: ORAL_TABLET | ORAL | 4 refills | Status: DC

## 2017-06-29 MED ORDER — TADALAFIL 20 MG PO TABS: ORAL_TABLET | ORAL | 11 refills | Status: DC

## 2017-06-29 MED ORDER — ATORVASTATIN CALCIUM 20 MG PO TABS: 20.0000 mg | ORAL_TABLET | Freq: Every day | ORAL | 4 refills | Status: DC

## 2017-06-29 NOTE — Patient Instructions (Signed)
Continue good diet exercise and health habits  Labs today,,,,,,,,,, I will call you if anything abnormal  You get a call from radiology about the AAA screen  Return in one year for general physical exam sooner if any problems

## 2017-06-29 NOTE — Progress Notes (Signed)
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 fib erectile dysfunction  He has a history of atrial fibrillation is on atenolol 12.5 mg daily. He's had no episodes of A. fib in the last 12 months. He's due to see Dr. Lovena Le his electrophysiologist in the next 4-6 weeks.  He's on Lipitor 20 along with Niaspan and fish oil and a baby aspirin because of a history of hyperlipidemia. Lipids will be checked today  He uses Cialis 5 mg when necessary for mild ED but it doesn't seem to be working.  We will increase the dose to 10 mg daily  He gets routine eye care, dental care, recent colonoscopy showed a flat polyp. Advised to go back in 3 years for follow-up colonoscopy. He says it may be done at the hospital because they may want to inject it to form a stalk so it's more easily removed.  Vaccination history all up-to-date  Cognitive function normally exercises daily home health safety reviewed no issues identified, he does have guns in the house..... He's an avid Retail banker.......... they're locked up and say. He does have a healthcare power of attorney and living well  He's had his left hip redone 3 years ago. He's doing well. In the past she's also had left knee replacement.  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 3 sons. Ryan by his first wife is a ENT in [place]. One son plays professional soccer one son works in Radio producer.  BP 108/74 (BP Location: Left Arm, Patient Position: Sitting, Cuff Size: Normal)   Pulse 76   Temp 98.4 F (36.9 C) (Oral)   Ht 6\' 2"  (1.88 m)   Wt 187 lb (84.8 kg)   BMI 24.01 kg/m  He's well-developed well-nourished male no acute distress vital signs stable he is afebrile examination of the HEENT were negative neck was supple thyroid not enlarged no carotid bruits cardiopulmonary exam normal abdominal exam normal aorta is palpable and normal in diameter. Requesting a AAA  screen. Genitalia he has a slight bulge in the right groin slight hernia. Otherwise general exam normal. Rectal normal stool guaiac-negative prostate smooth nonnodular 2+ BPH. Extremities normal skin normal peripheral pulses normal except for scar right shoulder when he had a basal cell removed 2 months ago by Dr. Allyson Sabal his dermatologist.  #1 healthy male  #2 history of hyperlipidemia,,,,,,,,, continue meds check labs  #3 history of A. Fib,,,,,,,,, sinus rhythm 12 months,,,,,, continue beta blocker 12.5 mg daily  #4 ED,,,,,,,,,, increased Cialis to 10 mg daily.  #5 status post left hip replacement 3 years  #6 status post left knee replacement 10 years.  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

## 2017-06-30 LAB — HEPATITIS C ANTIBODY
Hepatitis C Ab: NONREACTIVE
SIGNAL TO CUT-OFF: 0.01 (ref <1.00)

## 2017-06-30 NOTE — Progress Notes (Signed)
The patient was seen and had a complete evaluation as noted above person isn't at a Medicare wellness visit.  I reviewed the questions I asked the problem list and her game plan. All of which are appropriate.

## 2017-07-22 DIAGNOSIS — H01025 Squamous blepharitis left lower eyelid: Secondary | ICD-10-CM | POA: Diagnosis not present

## 2017-07-22 DIAGNOSIS — H2513 Age-related nuclear cataract, bilateral: Secondary | ICD-10-CM | POA: Diagnosis not present

## 2017-07-22 DIAGNOSIS — H10413 Chronic giant papillary conjunctivitis, bilateral: Secondary | ICD-10-CM | POA: Diagnosis not present

## 2017-07-22 DIAGNOSIS — H0289 Other specified disorders of eyelid: Secondary | ICD-10-CM | POA: Diagnosis not present

## 2017-07-22 DIAGNOSIS — H01021 Squamous blepharitis right upper eyelid: Secondary | ICD-10-CM | POA: Diagnosis not present

## 2017-07-22 DIAGNOSIS — H01022 Squamous blepharitis right lower eyelid: Secondary | ICD-10-CM | POA: Diagnosis not present

## 2017-07-22 DIAGNOSIS — H01024 Squamous blepharitis left upper eyelid: Secondary | ICD-10-CM | POA: Diagnosis not present

## 2017-07-29 ENCOUNTER — Ambulatory Visit (HOSPITAL_COMMUNITY)
Admission: RE | Admit: 2017-07-29 | Discharge: 2017-07-29 | Disposition: A | Discharge status: Home / Self Care | Payer: Medicare Other | Source: Ambulatory Visit | Attending: Cardiovascular Disease | Admitting: Cardiovascular Disease

## 2017-07-29 ENCOUNTER — Encounter (HOSPITAL_COMMUNITY): Payer: Medicare Other

## 2017-07-29 DIAGNOSIS — Z87891 Personal history of nicotine dependence: Secondary | ICD-10-CM | POA: Insufficient documentation

## 2017-07-29 DIAGNOSIS — Z136 Encounter for screening for cardiovascular disorders: Secondary | ICD-10-CM | POA: Diagnosis not present

## 2017-09-01 ENCOUNTER — Ambulatory Visit: Payer: Medicare Other | Admitting: Internal Medicine

## 2017-09-01 ENCOUNTER — Encounter: Payer: Self-pay | Admitting: Internal Medicine

## 2017-09-01 VITALS — BP 118/74 | HR 77 | Ht 74.0 in | Wt 191.0 lb

## 2017-09-01 DIAGNOSIS — I484 Atypical atrial flutter: Secondary | ICD-10-CM | POA: Diagnosis not present

## 2017-09-01 NOTE — Patient Instructions (Signed)

## 2017-09-01 NOTE — Progress Notes (Signed)
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 He developed a tachycardia cardiomyopathy but then had normalization of his LV function. He has had no additional arrhythmias and his blood pressure is normal. He actually denies a h/o HTN. The patient exercises regularly with no limitation. He states that he checks his HR and blood pressure regularly and it is normal.   No Known Allergies   Current Outpatient Medications  Medication Sig Dispense Refill  . aspirin 81 MG tablet Take 81 mg by mouth daily.    Marland Kitchen atenolol (TENORMIN) 25 MG tablet Take one half tablet by mouth daily 50 tablet 4  . atorvastatin (LIPITOR) 20 MG tablet Take 1 tablet (20 mg total) by mouth daily. 90 tablet 4  . Multiple Vitamins-Minerals (CENTRUM SILVER PO) Take 1 capsule by mouth daily.    . niacin (NIASPAN) 1000 MG CR tablet TAKE 1 TABLET BY MOUTH AT  BEDTIME 90 tablet 4  . Omega-3 Fatty Acids (FISH OIL PO) Take 1 capsule by mouth daily. Taking Omega-3 1040 mg daily    . tadalafil (CIALIS) 20 MG tablet 1 tablet 3-4 hours prior to sex 30 tablet 11   No current facility-administered medications for this visit.      Past Medical History:  Diagnosis Date  . A-fib ([place])   . ALLERGIC RHINITIS   . Arthritis   . Colon polyp   . Dysrhythmia   . Erectile dysfunction   . History of bronchitis as a child   . Hyperlipemia   . Hypertension   . S/P lateral meniscectomy of left knee     ROS:   All systems reviewed and negative except as noted in the HPI.   Past Surgical History:  Procedure Laterality Date  . CARDIOVERSION N/A 03/17/2014   Procedure: CARDIOVERSION;  Surgeon: Sanda Klein, MD;  Location: [facility];  Service: Cardiovascular;  Laterality: N/A;  . colonscopy     . Multiple Knee,elbow and foot surgeries    . neuroma left foot,2010, Dr. Anne Fu    . TEE WITHOUT  CARDIOVERSION N/A 03/17/2014   Procedure: TRANSESOPHAGEAL ECHOCARDIOGRAM (TEE);  Surgeon: Sanda Klein, MD;  Location: [facility];  Service: Cardiovascular;  Laterality: N/A;  . TOTAL HIP ARTHROPLASTY Left 07/18/2015   Procedure: LEFT TOTAL HIP ARTHROPLASTY ANTERIOR APPROACH;  Surgeon: Gaynelle Arabian, MD;  Location: [facility];  Service: Orthopedics;  Laterality: Left;     Family History  Problem Relation Age of Onset  . Coronary artery disease Unknown   . Prostate cancer Unknown   . Stroke Unknown   . Heart disease Mother      Social History   Socioeconomic History  . Marital status: Married    Spouse name: Not on file  . Number of children: Not on file  . Years of education: Not on file  . Highest education level: Not on file  Social Needs  . Financial resource strain: Not on file  . Food insecurity - worry: Not on file  . Food insecurity - inability: Not on file  . Transportation needs - medical: Not on file  . Transportation needs - non-medical: Not on file  Occupational History  . Occupation: retired Chief Financial Officer  . Occupation: former smoker  . Occupation: alocohol use-yes  . Occupation: Drug use-no  . Occupation: regular exercise-yes  Tobacco Use  . Smoking status: Former Smoker    Packs/day:  0.50    Years: 10.00    Pack years: 5.00    Types: Cigarettes    Last attempt to quit: 08/05/1987    Years since quitting: 30.0  . Smokeless tobacco: Never Used  Substance and Sexual Activity  . Alcohol use: Yes    Alcohol/week: 0.0 oz    Comment: glass of wine daily   . Drug use: No  . Sexual activity: Not on file  Other Topics Concern  . Not on file  Social History Narrative  . Not on file     BP 118/74   Pulse 77   Ht 6\' 2"  (1.88 m)   Wt 191 lb (86.6 kg)   BMI 24.52 kg/m   Physical Exam:  Well appearing 72 yo man, NAD HEENT: Unremarkable Neck:  No JVD, no thyromegally Lymphatics:  No adenopathy Back:  No CVA tenderness Lungs:  Clear with no  wheezes HEART:  Regular rate rhythm, no murmurs, no rubs, no clicks Abd:  soft, positive bowel sounds, no organomegally, no rebound, no guarding Ext:  2 plus pulses, no edema, no cyanosis, no clubbing Skin:  No rashes no nodules Neuro:  CN II through XII intact, motor grossly intact  EKG - NSR with RBBB   Assess/Plan: 1. Atrial flutter - he has had no recurrence of his atypical flutter. He monitors his HR with his Fit bit and has had no arrhythmias. He has moderated his ETOH use. We discussed the importance of monitoring his HR and I have recommended her consider an Apple watch or the APP on the iphone. When he turns 72 or if he truly develops HTN, we would consider starting an [place]. 2. Dyslipidemia - he will continue his Atorvastatin.   Mikle Bosworth.D.

## 2017-11-16 ENCOUNTER — Telehealth: Payer: Self-pay | Admitting: Family Medicine

## 2017-11-16 NOTE — Telephone Encounter (Signed)
Copied from [place] [id number]. Topic: General - Other >> Nov 16, 2017  2:07 PM Lennox Solders wrote: Reason for CRM:pt is calling back he sent a letter to dr todd about 3 wks and he would like the  nurse to return his call. Pt decline an appointment

## 2017-11-17 NOTE — Telephone Encounter (Signed)
Spoke with pt states that he mailed a letter last week asking for some Advise, pt state that he is checking on the status of the mail. Please Advise

## 2018-01-11 DIAGNOSIS — L814 Other melanin hyperpigmentation: Secondary | ICD-10-CM | POA: Diagnosis not present

## 2018-01-11 DIAGNOSIS — D1801 Hemangioma of skin and subcutaneous tissue: Secondary | ICD-10-CM | POA: Diagnosis not present

## 2018-01-11 DIAGNOSIS — L821 Other seborrheic keratosis: Secondary | ICD-10-CM | POA: Diagnosis not present

## 2018-01-11 DIAGNOSIS — L218 Other seborrheic dermatitis: Secondary | ICD-10-CM | POA: Diagnosis not present

## 2018-01-11 DIAGNOSIS — L570 Actinic keratosis: Secondary | ICD-10-CM | POA: Diagnosis not present

## 2018-01-20 DIAGNOSIS — H5203 Hypermetropia, bilateral: Secondary | ICD-10-CM | POA: Diagnosis not present

## 2018-03-07 DIAGNOSIS — I482 Chronic atrial fibrillation: Secondary | ICD-10-CM | POA: Diagnosis not present

## 2018-03-07 DIAGNOSIS — G319 Degenerative disease of nervous system, unspecified: Secondary | ICD-10-CM | POA: Diagnosis not present

## 2018-03-07 DIAGNOSIS — R55 Syncope and collapse: Secondary | ICD-10-CM | POA: Diagnosis not present

## 2018-03-07 DIAGNOSIS — I10 Essential (primary) hypertension: Secondary | ICD-10-CM | POA: Diagnosis not present

## 2018-03-07 DIAGNOSIS — Z7901 Long term (current) use of anticoagulants: Secondary | ICD-10-CM | POA: Diagnosis not present

## 2018-03-07 DIAGNOSIS — E869 Volume depletion, unspecified: Secondary | ICD-10-CM | POA: Diagnosis not present

## 2018-03-08 ENCOUNTER — Encounter: Payer: Self-pay | Admitting: Family Medicine

## 2018-03-08 ENCOUNTER — Telehealth: Payer: Self-pay

## 2018-03-08 DIAGNOSIS — R55 Syncope and collapse: Secondary | ICD-10-CM | POA: Diagnosis not present

## 2018-03-08 DIAGNOSIS — G319 Degenerative disease of nervous system, unspecified: Secondary | ICD-10-CM | POA: Diagnosis not present

## 2018-03-08 DIAGNOSIS — M19072 Primary osteoarthritis, left ankle and foot: Secondary | ICD-10-CM | POA: Diagnosis not present

## 2018-03-08 NOTE — Telephone Encounter (Signed)
Call received from a Dr. Carren Rang (?) in [place].  Pt was at a [employer] when he had a syncopal episode.  Dr. Carren Rang spoke with DOD.  DOD advised an ECHO.  Advised pt to follow up with Dr. Lovena Le in a few weeks.  Will continue to monitor.

## 2018-03-09 DIAGNOSIS — E869 Volume depletion, unspecified: Secondary | ICD-10-CM | POA: Diagnosis not present

## 2018-03-09 DIAGNOSIS — I10 Essential (primary) hypertension: Secondary | ICD-10-CM | POA: Diagnosis not present

## 2018-03-09 DIAGNOSIS — R55 Syncope and collapse: Secondary | ICD-10-CM | POA: Diagnosis not present

## 2018-03-09 DIAGNOSIS — I4891 Unspecified atrial fibrillation: Secondary | ICD-10-CM | POA: Diagnosis not present

## 2018-03-10 ENCOUNTER — Telehealth: Payer: Self-pay | Admitting: Internal Medicine

## 2018-03-10 DIAGNOSIS — R55 Syncope and collapse: Secondary | ICD-10-CM

## 2018-03-10 NOTE — Telephone Encounter (Signed)
New Message       Patient was in hospital while in [place] and would like to know if Dr Lovena Le wants to see him now or run test first.

## 2018-03-10 NOTE — Telephone Encounter (Signed)
Returned call to Pt.  Will schedule echo at this time, and have Pt f/u with Dr. Lovena Le at the end of this month.  Pt in agreement-would like echo prior to leaving town on August 15, will be back March 29, 2018.  Will advise scheduling.  F/u appt made with Dr. Lovena Le for end of August.

## 2018-03-12 ENCOUNTER — Encounter: Payer: Self-pay | Admitting: Internal Medicine

## 2018-03-15 ENCOUNTER — Other Ambulatory Visit (HOSPITAL_COMMUNITY): Payer: Medicare Other

## 2018-03-30 ENCOUNTER — Other Ambulatory Visit: Payer: Self-pay

## 2018-03-30 ENCOUNTER — Ambulatory Visit (HOSPITAL_COMMUNITY): Payer: Medicare Other | Attending: Cardiovascular Disease

## 2018-03-30 DIAGNOSIS — R55 Syncope and collapse: Secondary | ICD-10-CM | POA: Insufficient documentation

## 2018-03-30 DIAGNOSIS — Z87891 Personal history of nicotine dependence: Secondary | ICD-10-CM | POA: Diagnosis not present

## 2018-03-30 DIAGNOSIS — I119 Hypertensive heart disease without heart failure: Secondary | ICD-10-CM | POA: Insufficient documentation

## 2018-03-30 DIAGNOSIS — I4892 Unspecified atrial flutter: Secondary | ICD-10-CM | POA: Diagnosis not present

## 2018-03-30 DIAGNOSIS — E785 Hyperlipidemia, unspecified: Secondary | ICD-10-CM | POA: Insufficient documentation

## 2018-04-02 ENCOUNTER — Encounter: Payer: Self-pay | Admitting: Internal Medicine

## 2018-04-02 ENCOUNTER — Ambulatory Visit: Payer: Medicare Other | Admitting: Internal Medicine

## 2018-04-02 VITALS — BP 124/62 | HR 62 | Ht 74.0 in | Wt 181.0 lb

## 2018-04-02 DIAGNOSIS — R55 Syncope and collapse: Secondary | ICD-10-CM | POA: Diagnosis not present

## 2018-04-02 DIAGNOSIS — I484 Atypical atrial flutter: Secondary | ICD-10-CM | POA: Diagnosis not present

## 2018-04-02 MED ORDER — RIVAROXABAN 20 MG PO TABS: 20.0000 mg | ORAL_TABLET | Freq: Every day | ORAL | 11 refills | Status: DC

## 2018-04-02 NOTE — Patient Instructions (Addendum)
Medication Instructions:  Your physician has recommended you make the following change in your medication:  1.  Dr. Lovena Le recommends you start Xarelto 20 mg one tablet by mouth daily with supper.  Labwork: None ordered.  Testing/Procedures: Days available for input of loop recorder:  September 3, 6, 9, 10, 16, 19, 23, and 27 October 3, 4, 7, 10, 24, 25, 28 and 29  If you decide to go ahead with the procedure please call me: Myrtie Hawk, RN with Dr. Lovena Le [phone]  Follow-Up: Your physician wants you to follow-up in: 3-4 months with Dr. Lovena Le.   You will receive a reminder letter in the mail two months in advance. If you don't receive a letter, please call our office to schedule the follow-up appointment.  Any Other Special Instructions Will Be Listed Below (If Applicable).  If you need a refill on your cardiac medications before your next appointment, please call your pharmacy.    Implantable Loop Recorder Placement An implantable loop recorder is a small electronic device that is placed under the skin of your chest. It is about the size of an AA ("double A") battery. The device records the electrical activity of your heart over a long period of time. Your health care provider can download these recordings to monitor your heart. You may need an implantable loop recorder if you have periods of abnormal heart activity (arrhythmias) or unexplained fainting (syncope) caused by a heart problem. Tell a health care provider about:  Any allergies you have.  All medicines you are taking, including vitamins, herbs, eye drops, creams, and over-the-counter medicines.  Any problems you or family members have had with anesthetic medicines.  Any blood disorders you have.  Any surgeries you have had.  Any medical conditions you have.  Whether you are pregnant or may be pregnant. What are the risks? Generally, this is a safe procedure. However, as with any procedure, problems may  occur, including:  Infection.  Bleeding.  Allergic reactions to anesthetic medicines.  Damage to nerves or blood vessels.  Failure of the device to work. This could require another surgery to replace it.  What happens before the procedure?   You may have a physical exam, blood tests, and imaging tests of your heart, such as a chest X-ray.  Follow instructions from your health care provider about eating or drinking restrictions.  Ask your health care provider about: ? Changing or stopping your regular medicines. This is especially important if you are taking diabetes medicines or blood thinners. ? Taking medicines such as aspirin and ibuprofen. These medicines can thin your blood. Do not take these medicines before your procedure if your surgeon instructs you not to.  Ask your health care provider how your surgical site will be marked or identified.  You may be given antibiotic medicine to help prevent infection.  Plan to have someone take you home after the procedure.  If you will be going home right after the procedure, plan to have someone with you for 24 hours.  Do not use any tobacco products, such as cigarettes, chewing tobacco, and e-cigarettes as told by your surgeon. If you need help quitting, ask your health care provider. What happens during the procedure?  To reduce your risk of infection: ? Your health care team will wash or sanitize their hands. ? Your skin will be washed with soap.  An IV tube will be inserted into one of your veins.  You may be given an antibiotic medicine through the  IV tube.  You may be given one or more of the following: ? A medicine to help you relax (sedative). ? A medicine to numb the area (local anesthetic).  A small cut (incision) will be made on the left side of your upper chest.  A pocket will be created under your skin.  The device will be placed in the pocket.  The incision will be closed with stitches (sutures) or  adhesive strips.  A bandage (dressing) will be placed over the incision. The procedure may vary among health care providers and hospitals. What happens after the procedure?  Your blood pressure, heart rate, breathing rate, and blood oxygen level will be monitored often until the medicines you were given have worn off.  You may be able to go home on the day of your surgery. Before going home: ? Your health care provider will program your recorder. ? You will learn how to trigger your device with a handheld activator. ? You will learn how to send recordings to your health care provider. ? You will get an ID card for your device, and you will be told when to use it.  Do not drive for 24 hours if you received a sedative. This information is not intended to replace advice given to you by your health care provider. Make sure you discuss any questions you have with your health care provider. Document Released: 07/02/2015 Document Revised: 12/27/2015 Document Reviewed: 04/25/2015 Elsevier Interactive Patient Education  [employer].

## 2018-04-02 NOTE — Progress Notes (Signed)
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 He was visiting in [place] where he had a syncopal spell. He has undergone a repeat 2D echo which shows that his EF has reduced from normal down to 45%. He does not have syncope. He had no warning with regard to his syncopal episode. He was noted in the ED in [facility] to have been in and out of atrial fib. He has not had peripheral edema. He does not have anginal symptoms.   No Known Allergies   Current Outpatient Medications  Medication Sig Dispense Refill  . aspirin 81 MG tablet Take 81 mg by mouth daily.    Marland Kitchen atenolol (TENORMIN) 25 MG tablet Take one half tablet by mouth daily 50 tablet 4  . atorvastatin (LIPITOR) 20 MG tablet Take 1 tablet (20 mg total) by mouth daily. 90 tablet 4  . Multiple Vitamins-Minerals (CENTRUM SILVER PO) Take 1 capsule by mouth daily.    . Omega-3 Fatty Acids (FISH OIL PO) Take 1 capsule by mouth daily. Taking Omega-3 1040 mg daily    . tadalafil (CIALIS) 20 MG tablet 1 tablet 3-4 hours prior to sex 30 tablet 11   No current facility-administered medications for this visit.      Past Medical History:  Diagnosis Date  . A-fib ([place])   . ALLERGIC RHINITIS   . Arthritis   . Colon polyp   . Dysrhythmia   . Erectile dysfunction   . History of bronchitis as a child   . Hyperlipemia   . Hypertension   . S/P lateral meniscectomy of left knee     ROS:   All systems reviewed and negative except as noted in the HPI.   Past Surgical History:  Procedure Laterality Date  . CARDIOVERSION N/A 03/17/2014   Procedure: CARDIOVERSION;  Surgeon: Sanda Klein, MD;  Location: [facility];  Service: Cardiovascular;  Laterality: N/A;  . colonscopy     . Multiple Knee,elbow and foot surgeries    . neuroma left foot,2010, Dr. Anne Fu    . TEE WITHOUT CARDIOVERSION N/A 03/17/2014   Procedure: TRANSESOPHAGEAL ECHOCARDIOGRAM (TEE);  Surgeon: Sanda Klein, MD;  Location: [place];  Service: Cardiovascular;   Laterality: N/A;  . TOTAL HIP ARTHROPLASTY Left 07/18/2015   Procedure: LEFT TOTAL HIP ARTHROPLASTY ANTERIOR APPROACH;  Surgeon: Gaynelle Arabian, MD;  Location: [facility];  Service: Orthopedics;  Laterality: Left;     Family History  Problem Relation Age of Onset  . Coronary artery disease Unknown   . Prostate cancer Unknown   . Stroke Unknown   . Heart disease Mother      Social History   Socioeconomic History  . Marital status: Married    Spouse name: Not on file  . Number of children: Not on file  . Years of education: Not on file  . Highest education level: Not on file  Occupational History  . Occupation: retired Chief Financial Officer  . Occupation: former smoker  . Occupation: alocohol use-yes  . Occupation: Drug use-no  . Occupation: regular exercise-yes  Social Needs  . Financial resource strain: Not on file  . Food insecurity:    Worry: Not on file    Inability: Not on file  . Transportation needs:    Medical: Not on file    Non-medical: Not on file  Tobacco Use  . Smoking status: Former Smoker    Packs/day: 0.50    Years: 10.00    Pack years: 5.00    Types: Cigarettes  Last attempt to quit: 08/05/1987    Years since quitting: 30.6  . Smokeless tobacco: Never Used  Substance and Sexual Activity  . Alcohol use: Yes    Alcohol/week: 0.0 standard drinks    Comment: glass of wine daily   . Drug use: No  . Sexual activity: Not on file  Lifestyle  . Physical activity:    Days per week: Not on file    Minutes per session: Not on file  . Stress: Not on file  Relationships  . Social connections:    Talks on phone: Not on file    Gets together: Not on file    Attends religious service: Not on file    Active member of club or organization: Not on file    Attends meetings of clubs or organizations: Not on file    Relationship status: Not on file  . Intimate partner violence:    Fear of current or ex partner: Not on file    Emotionally abused: Not on file     Physically abused: Not on file    Forced sexual activity: Not on file  Other Topics Concern  . Not on file  Social History Narrative  . Not on file     BP 124/62   Pulse 62   Ht 6\' 2"  (1.88 m)   Wt 181 lb (82.1 kg)   BMI 23.24 kg/m   Physical Exam:  Well appearing 72 yo man, NAD HEENT: Unremarkable Neck:  No JVD, no thyromegally Lymphatics:  No adenopathy Back:  No CVA tenderness Lungs:  Clear with no wheezes HEART:  Regular rate rhythm, no murmurs, no rubs, no clicks Abd:  soft, positive bowel sounds, no organomegally, no rebound, no guarding Ext:  2 plus pulses, no edema, no cyanosis, no clubbing Skin:  No rashes no nodules Neuro:  CN II through XII intact, motor grossly intact  EKG - NSR with RBBB  Assess/Plan: 1. PAF - he seems to have developed atrial fib. I am concerned about post termination pauses. I have recommended her start taking either Xarelto which he had been on before or Eliquis. He is not sure that he wants to start despite my recommendation. 2. LV dysfunction - while he does not have CHF symptoms, his EF is down. He will need to start either an ARB or an ACE inhibitor. I am inclined to recheck his EF in 3-4 months 3. Syncope - I have recommended he have an ILR placed for unexplained syncope. With his EF down, it is unlikely but no impossible that he have a ventricular arrhythmia. He is considering his options. He will call us when he is ready to schedule this procedure. My suspicion is that he has had a post termination pause out of atrial fib. 4. HTN - it is not entirely clear to me that he has HTN. He has been left on a beta blocker over the past few years because I was concerned about recurrent atrial arrhythmias.   Mikle Bosworth.D.

## 2018-04-02 NOTE — H&P (View-Only) (Signed)
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 He was visiting in [place] where he had a syncopal spell. He has undergone a repeat 2D echo which shows that his EF has reduced from normal down to 45%. He does not have syncope. He had no warning with regard to his syncopal episode. He was noted in the ED in [facility] to have been in and out of atrial fib. He has not had peripheral edema. He does not have anginal symptoms.   No Known Allergies   Current Outpatient Medications  Medication Sig Dispense Refill  . aspirin 81 MG tablet Take 81 mg by mouth daily.    Marland Kitchen atenolol (TENORMIN) 25 MG tablet Take one half tablet by mouth daily 50 tablet 4  . atorvastatin (LIPITOR) 20 MG tablet Take 1 tablet (20 mg total) by mouth daily. 90 tablet 4  . Multiple Vitamins-Minerals (CENTRUM SILVER PO) Take 1 capsule by mouth daily.    . Omega-3 Fatty Acids (FISH OIL PO) Take 1 capsule by mouth daily. Taking Omega-3 1040 mg daily    . tadalafil (CIALIS) 20 MG tablet 1 tablet 3-4 hours prior to sex 30 tablet 11   No current facility-administered medications for this visit.      Past Medical History:  Diagnosis Date  . A-fib ([place])   . ALLERGIC RHINITIS   . Arthritis   . Colon polyp   . Dysrhythmia   . Erectile dysfunction   . History of bronchitis as a child   . Hyperlipemia   . Hypertension   . S/P lateral meniscectomy of left knee     ROS:   All systems reviewed and negative except as noted in the HPI.   Past Surgical History:  Procedure Laterality Date  . CARDIOVERSION N/A 03/17/2014   Procedure: CARDIOVERSION;  Surgeon: Sanda Klein, MD;  [facility];  Service: Cardiovascular;  Laterality: N/A;  . colonscopy     . Multiple Knee,elbow and foot surgeries    . neuroma left foot,2010, Dr. Anne Fu    . TEE WITHOUT CARDIOVERSION N/A 03/17/2014   Procedure: TRANSESOPHAGEAL ECHOCARDIOGRAM (TEE);  Surgeon: Sanda Klein, MD;  Location: [place];  Service: Cardiovascular;   Laterality: N/A;  . TOTAL HIP ARTHROPLASTY Left 07/18/2015   Procedure: LEFT TOTAL HIP ARTHROPLASTY ANTERIOR APPROACH;  Surgeon: Gaynelle Arabian, MD;  Location: [facility];  Service: Orthopedics;  Laterality: Left;     Family History  Problem Relation Age of Onset  . Coronary artery disease Unknown   . Prostate cancer Unknown   . Stroke Unknown   . Heart disease Mother      Social History   Socioeconomic History  . Marital status: Married    Spouse name: Not on file  . Number of children: Not on file  . Years of education: Not on file  . Highest education level: Not on file  Occupational History  . Occupation: retired Chief Financial Officer  . Occupation: former smoker  . Occupation: alocohol use-yes  . Occupation: Drug use-no  . Occupation: regular exercise-yes  Social Needs  . Financial resource strain: Not on file  . Food insecurity:    Worry: Not on file    Inability: Not on file  . Transportation needs:    Medical: Not on file    Non-medical: Not on file  Tobacco Use  . Smoking status: Former Smoker    Packs/day: 0.50    Years: 10.00    Pack years: 5.00    Types: Cigarettes  Last attempt to quit: 08/05/1987    Years since quitting: 30.6  . Smokeless tobacco: Never Used  Substance and Sexual Activity  . Alcohol use: Yes    Alcohol/week: 0.0 standard drinks    Comment: glass of wine daily   . Drug use: No  . Sexual activity: Not on file  Lifestyle  . Physical activity:    Days per week: Not on file    Minutes per session: Not on file  . Stress: Not on file  Relationships  . Social connections:    Talks on phone: Not on file    Gets together: Not on file    Attends religious service: Not on file    Active member of club or organization: Not on file    Attends meetings of clubs or organizations: Not on file    Relationship status: Not on file  . Intimate partner violence:    Fear of current or ex partner: Not on file    Emotionally abused: Not on file     Physically abused: Not on file    Forced sexual activity: Not on file  Other Topics Concern  . Not on file  Social History Narrative  . Not on file     BP 124/62   Pulse 62   Ht 6\' 2"  (1.88 m)   Wt 181 lb (82.1 kg)   BMI 23.24 kg/m   Physical Exam:  Well appearing 72 yo man, NAD HEENT: Unremarkable Neck:  No JVD, no thyromegally Lymphatics:  No adenopathy Back:  No CVA tenderness Lungs:  Clear with no wheezes HEART:  Regular rate rhythm, no murmurs, no rubs, no clicks Abd:  soft, positive bowel sounds, no organomegally, no rebound, no guarding Ext:  2 plus pulses, no edema, no cyanosis, no clubbing Skin:  No rashes no nodules Neuro:  CN II through XII intact, motor grossly intact  EKG - NSR with RBBB  Assess/Plan: 1. PAF - he seems to have developed atrial fib. I am concerned about post termination pauses. I have recommended her start taking either Xarelto which he had been on before or Eliquis. He is not sure that he wants to start despite my recommendation. 2. LV dysfunction - while he does not have CHF symptoms, his EF is down. He will need to start either an ARB or an ACE inhibitor. I am inclined to recheck his EF in 3-4 months 3. Syncope - I have recommended he have an ILR placed for unexplained syncope. With his EF down, it is unlikely but no impossible that he have a ventricular arrhythmia. He is considering his options. He will call us when he is ready to schedule this procedure. My suspicion is that he has had a post termination pause out of atrial fib. 4. HTN - it is not entirely clear to me that he has HTN. He has been left on a beta blocker over the past few years because I was concerned about recurrent atrial arrhythmias.   Mikle Bosworth.D.

## 2018-04-06 ENCOUNTER — Encounter: Payer: Self-pay | Admitting: Internal Medicine

## 2018-04-07 ENCOUNTER — Telehealth: Payer: Self-pay | Admitting: Internal Medicine

## 2018-04-07 NOTE — Telephone Encounter (Signed)
Follow Up:     Pt called and said 04-12-18 would be fine to get his Loop Recorder, he just needs a time please.

## 2018-04-08 NOTE — Telephone Encounter (Signed)
lmtcb

## 2018-04-08 NOTE — Telephone Encounter (Signed)
Follow up   Patient is returning call to inquire about getting setup on the loop recorder. Please call to discuss.

## 2018-04-09 NOTE — Telephone Encounter (Signed)
Follow up  ° ° °Patient is returning your call. °

## 2018-04-09 NOTE — Telephone Encounter (Signed)
Left detailed message for Pt per DPR Advised will schedule for loop implant on 04/12/2018 at 6:30 am.   Sent message via [place].  Requested call back if this time does not work.  Will go ahead and schedule.

## 2018-04-12 ENCOUNTER — Ambulatory Visit (HOSPITAL_COMMUNITY)
Admission: RE | Admit: 2018-04-12 | Discharge: 2018-04-12 | Disposition: A | Discharge status: Home / Self Care | Payer: Medicare Other | Source: Ambulatory Visit | Attending: Internal Medicine | Admitting: Internal Medicine

## 2018-04-12 ENCOUNTER — Encounter (HOSPITAL_COMMUNITY)
Admission: RE | Disposition: A | Discharge status: Home / Self Care | Payer: Self-pay | Source: Ambulatory Visit | Attending: Internal Medicine

## 2018-04-12 ENCOUNTER — Encounter (HOSPITAL_COMMUNITY): Payer: Self-pay | Admitting: *Deleted

## 2018-04-12 DIAGNOSIS — Z7982 Long term (current) use of aspirin: Secondary | ICD-10-CM | POA: Insufficient documentation

## 2018-04-12 DIAGNOSIS — N529 Male erectile dysfunction, unspecified: Secondary | ICD-10-CM | POA: Insufficient documentation

## 2018-04-12 DIAGNOSIS — R55 Syncope and collapse: Secondary | ICD-10-CM | POA: Insufficient documentation

## 2018-04-12 DIAGNOSIS — Z823 Family history of stroke: Secondary | ICD-10-CM | POA: Insufficient documentation

## 2018-04-12 DIAGNOSIS — I4891 Unspecified atrial fibrillation: Secondary | ICD-10-CM | POA: Diagnosis not present

## 2018-04-12 DIAGNOSIS — Z79899 Other long term (current) drug therapy: Secondary | ICD-10-CM | POA: Diagnosis not present

## 2018-04-12 DIAGNOSIS — Z87891 Personal history of nicotine dependence: Secondary | ICD-10-CM | POA: Insufficient documentation

## 2018-04-12 DIAGNOSIS — Z96642 Presence of left artificial hip joint: Secondary | ICD-10-CM | POA: Insufficient documentation

## 2018-04-12 DIAGNOSIS — Z9889 Other specified postprocedural states: Secondary | ICD-10-CM | POA: Insufficient documentation

## 2018-04-12 DIAGNOSIS — Z8249 Family history of ischemic heart disease and other diseases of the circulatory system: Secondary | ICD-10-CM | POA: Diagnosis not present

## 2018-04-12 DIAGNOSIS — Z86010 Personal history of colonic polyps: Secondary | ICD-10-CM | POA: Diagnosis not present

## 2018-04-12 DIAGNOSIS — M1990 Unspecified osteoarthritis, unspecified site: Secondary | ICD-10-CM | POA: Diagnosis not present

## 2018-04-12 DIAGNOSIS — E785 Hyperlipidemia, unspecified: Secondary | ICD-10-CM | POA: Insufficient documentation

## 2018-04-12 DIAGNOSIS — I10 Essential (primary) hypertension: Secondary | ICD-10-CM | POA: Diagnosis not present

## 2018-04-12 HISTORY — PX: LOOP RECORDER INSERTION: EP1214

## 2018-04-12 SURGERY — LOOP RECORDER INSERTION
Anesthesia: LOCAL

## 2018-04-12 MED ORDER — LIDOCAINE-EPINEPHRINE 1 %-1:100000 IJ SOLN
INTRAMUSCULAR | Status: DC | PRN
  Administered 2018-04-12: 20 mL

## 2018-04-12 MED ORDER — LIDOCAINE-EPINEPHRINE 1 %-1:100000 IJ SOLN
INTRAMUSCULAR | Status: AC
  Filled 2018-04-12: qty 1

## 2018-04-12 SURGICAL SUPPLY — 2 items
LOOP REVEAL LINQSYS (Prosthesis & Implant Heart) ×1 IMPLANT
PACK LOOP INSERTION (CUSTOM PROCEDURE TRAY) ×2 IMPLANT

## 2018-04-12 NOTE — Interval H&P Note (Signed)
History and Physical Interval Note:  04/12/2018 8:07 AM  Garrett Vargas  has presented today for surgery, with the diagnosis of syncope  The various methods of treatment have been discussed with the patient and family. After consideration of risks, benefits and other options for treatment, the patient has consented to  Procedure(s): LOOP RECORDER INSERTION (N/A) as a surgical intervention .  The patient's history has been reviewed, patient examined, no change in status, stable for surgery.  I have reviewed the patient's chart and labs.  Questions were answered to the patient's satisfaction.     [name]

## 2018-04-21 ENCOUNTER — Other Ambulatory Visit: Payer: Self-pay

## 2018-04-21 MED ORDER — RIVAROXABAN 20 MG PO TABS: 20.0000 mg | ORAL_TABLET | Freq: Every day | ORAL | 1 refills | Status: DC

## 2018-04-21 NOTE — Telephone Encounter (Signed)
Pt last saw Dr Lovena Le 04/02/18, last labs 06/29/17 Creat 0.79, age 72, weight 82.1kg, CrCl 98.15, based on CrCl pt is on appropriate dosage of Xarelto 20mg  QD.  Will refill rx.

## 2018-04-23 ENCOUNTER — Other Ambulatory Visit: Payer: Self-pay | Admitting: *Deleted

## 2018-04-23 MED ORDER — RIVAROXABAN 20 MG PO TABS: 20.0000 mg | ORAL_TABLET | Freq: Every day | ORAL | 1 refills | Status: DC

## 2018-04-23 NOTE — Telephone Encounter (Signed)
Pt is a 72 yr old male who last saw Dr Lovena Le on 04/02/18. Weight at that visit was 82.1Kg. Last SCr was 0.79 on 06/29/17. CrCl is 21mL/min. Will refill  Xarelto 20mg  QD.

## 2018-04-27 ENCOUNTER — Ambulatory Visit (INDEPENDENT_AMBULATORY_CARE_PROVIDER_SITE_OTHER): Payer: Medicare Other | Admitting: *Deleted

## 2018-04-27 DIAGNOSIS — R55 Syncope and collapse: Secondary | ICD-10-CM

## 2018-04-27 LAB — CUP PACEART INCLINIC DEVICE CHECK
Date Time Interrogation Session: 20190924160046
Implantable Pulse Generator Implant Date: 20190909

## 2018-04-27 NOTE — Progress Notes (Signed)
Wound Loop check in clinic. Steri-Strips removed prior to OV, Incision edges approximated. Wound well healed, no redness or edema noted. Battery status: Good. R-waves 0.56mV. 0 symptom episodes, 0 tachy episodes, 0 pause episodes, 0 brady episodes. 1 AF episodes (<0.1% burden) + Xarelto. Monthly summary reports and ROV with GT 07/12/2018. Pt educated about symptom activator and home monitor.

## 2018-05-17 ENCOUNTER — Ambulatory Visit (INDEPENDENT_AMBULATORY_CARE_PROVIDER_SITE_OTHER): Payer: Medicare Other | Admitting: *Deleted

## 2018-05-17 DIAGNOSIS — R55 Syncope and collapse: Secondary | ICD-10-CM | POA: Diagnosis not present

## 2018-05-18 NOTE — Progress Notes (Signed)
[facility] Summary Report / Loop Recorder 

## 2018-05-31 LAB — CUP PACEART REMOTE DEVICE CHECK
Date Time Interrogation Session: 20191012113534
MDC IDC PG IMPLANT DT: 20190909

## 2018-06-09 ENCOUNTER — Telehealth: Payer: Self-pay | Admitting: *Deleted

## 2018-06-09 ENCOUNTER — Encounter: Payer: Self-pay | Admitting: *Deleted

## 2018-06-09 DIAGNOSIS — I10 Essential (primary) hypertension: Secondary | ICD-10-CM

## 2018-06-09 MED ORDER — ATENOLOL 25 MG PO TABS: ORAL_TABLET | ORAL | 4 refills | Status: DC

## 2018-06-09 NOTE — Telephone Encounter (Signed)
Mr. Foiles calling back. I made him aware of extra dose of atenolol if needed for palpitations/ heart racing. He verbalizes understanding. He would like a list of AF occurrences sent to him via [place].

## 2018-06-09 NOTE — Telephone Encounter (Signed)
-----   Message from Evans Lance, MD sent at 06/06/2018  8:13 PM EST ----- Remote device check reviewed. Histograms appropriate. Leads and battery stable for patient. Follow up as outlined above. No recommended changes. Let patient know that he can take an extra half atenolol if he feels his heart racing.

## 2018-06-09 NOTE — Telephone Encounter (Signed)
LMOM to return call to [facility]. 

## 2018-06-17 ENCOUNTER — Ambulatory Visit (INDEPENDENT_AMBULATORY_CARE_PROVIDER_SITE_OTHER): Payer: Medicare Other | Admitting: *Deleted

## 2018-06-17 DIAGNOSIS — R55 Syncope and collapse: Secondary | ICD-10-CM | POA: Diagnosis not present

## 2018-06-17 NOTE — Progress Notes (Signed)
[facility] Summary Report / Loop Recorder 

## 2018-06-24 ENCOUNTER — Ambulatory Visit: Payer: Medicare Other

## 2018-06-29 NOTE — Progress Notes (Addendum)
Subjective:   Garrett Vargas is a 72 y.o. male who presents for Medicare Annual/Subsequent preventive examination.  Reports health as very good  Dr. Megan Salon  Seeing Dr. Ethlyn Gallery 12.11.19 Oral and maxillary surgery  Atrial flutter followed  by cardiology   Married 3 sons  Middle one lives here One is a  Psychologist, sport and exercise (pituartary tumors)  One Microbiologist All coming in for thanksgiving One Sobieski dtr "on the way" - grand dtr in [place] with Dr. 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  PSA 1.69 06/2017 -  Followed here and will check this year  Colonoscopy 11/2013; Dr. Tami Lin and will repeat prior    Shingrix is a vaccine for the prevention of Shingles in Adults 65 and older.  If you are on Medicare, the shingrix is covered under your Part D plan, so you will take both of the vaccines in the series at your pharmacy. Please check with your benefits regarding applicable copays or out of pocket expenses.  The Shingrix is given in 2 vaccines approx 8 weeks apart. You must receive the 2nd dose prior to 6 months from receipt of the first. Please have the pharmacist print out you Immunization  dates for our office records    Cardiac Risk Factors include: advanced age (>29men, >8 women);dyslipidemia;hypertension;male gender;family history of premature cardiovascular disease     Objective:    Vitals: BP 102/60   Pulse (!) 55   Ht  6\' 2"  (1.88 m)   Wt 185 lb (83.9 kg)   SpO2 93%   BMI 23.75 kg/m   Body mass index is 23.75 kg/m.  Advanced Directives 07/05/2018 06/23/2017 07/18/2015 07/18/2015 07/13/2015  Does Patient Have a Medical Advance Directive? Yes Yes Yes - Yes  Type of Advance Directive - - Living will;Healthcare Power of Attorney - Living will;Healthcare Power of Attorney  Does patient want to make changes to medical advance directive? - - No - Patient declined - No - Patient declined  Copy of [place] in Chart? - - No - copy requested (No Data) No - copy requested    Tobacco Social History   Tobacco Use  Smoking Status Former Smoker  . Packs/day: 0.50  . Years: 10.00  . Pack years: 5.00  . Types: Cigarettes  . Last attempt to quit: 08/05/1987  . Years since quitting: 30.9  Smokeless Tobacco Never Used     Counseling given: Yes   Clinical Intake:     Past Medical History:  Diagnosis Date  . A-fib ([place])   . ALLERGIC RHINITIS   . Arthritis   . Colon polyp   . Dysrhythmia   . Erectile dysfunction   . History of bronchitis as a child   . Hyperlipemia   . Hypertension   . S/P lateral meniscectomy of left knee  Past Surgical History:  Procedure Laterality Date  . CARDIOVERSION N/A 03/17/2014   Procedure: CARDIOVERSION;  Surgeon: Sanda Klein, MD;  Location: [facility];  Service: Cardiovascular;  Laterality: N/A;  . colonscopy     . LOOP RECORDER INSERTION N/A 04/12/2018   Procedure: LOOP RECORDER INSERTION;  Surgeon: Evans Lance, MD;  Location: [facility];  Service: Cardiovascular;  Laterality: N/A;  . Multiple Knee,elbow and foot surgeries    . neuroma left foot,2010, Dr. Anne Fu    . TEE WITHOUT CARDIOVERSION N/A 03/17/2014   Procedure: TRANSESOPHAGEAL ECHOCARDIOGRAM (TEE);  Surgeon: Sanda Klein, MD;  Location: [facility];  Service: Cardiovascular;  Laterality: N/A;  . TOTAL HIP ARTHROPLASTY Left 07/18/2015   Procedure: LEFT TOTAL HIP  ARTHROPLASTY ANTERIOR APPROACH;  Surgeon: Gaynelle Arabian, MD;  Location: [facility];  Service: Orthopedics;  Laterality: Left;   Family History  Problem Relation Age of Onset  . Coronary artery disease Unknown   . Prostate cancer Unknown   . Stroke Unknown   . Heart disease Mother    Social History   Socioeconomic History  . Marital status: Married    Spouse name: Not on file  . Number of children: Not on file  . Years of education: Not on file  . Highest education level: Not on file  Occupational History  . Occupation: retired Chief Financial Officer  . Occupation: former smoker  . Occupation: alocohol use-yes  . Occupation: Drug use-no  . Occupation: regular exercise-yes  Social Needs  . Financial resource strain: Not on file  . Food insecurity:    Worry: Not on file    Inability: Not on file  . Transportation needs:    Medical: Not on file    Non-medical: Not on file  Tobacco Use  . Smoking status: Former Smoker    Packs/day: 0.50    Years: 10.00    Pack years: 5.00    Types: Cigarettes    Last attempt to quit: 08/05/1987    Years since quitting: 30.9  . Smokeless tobacco: Never Used  Substance and Sexual Activity  . Alcohol use: Yes    Alcohol/week: 0.0 standard drinks    Comment: occasional  . Drug use: No  . Sexual activity: Not on file  Lifestyle  . Physical activity:    Days per week: Not on file    Minutes per session: Not on file  . Stress: Not on file  Relationships  . Social connections:    Talks on phone: Not on file    Gets together: Not on file    Attends religious service: Not on file    Active member of club or organization: Not on file    Attends meetings of clubs or organizations: Not on file    Relationship status: Not on file  Other Topics Concern  . Not on file  Social History Narrative  . Not on file    Outpatient Encounter Medications as of 07/05/2018  Medication Sig  . atenolol (TENORMIN) 25 MG tablet Take one half tablet by mouth daily.  Patient may take an extra 1/2 tab daily for palpitations or heart racing.  Marland Kitchen atorvastatin (LIPITOR) 20 MG tablet Take 1 tablet (20 mg total) by mouth daily.  . Multiple Vitamins-Minerals (CENTRUM SILVER PO) Take 1 capsule by mouth daily.  . Omega-3 Fatty Acids (FISH OIL PO) Take 1 capsule by mouth daily.   . rivaroxaban (XARELTO) 20 MG TABS tablet Take 1 tablet (20 mg total) by mouth daily with  supper.  . tadalafil (CIALIS) 20 MG tablet 1 tablet 3-4 hours prior to sex   No facility-administered encounter medications on file as of 07/05/2018.     Activities of Daily Living In your present state of health, do you have any difficulty performing the following activities: 07/05/2018  Hearing? N  Vision? N  Difficulty concentrating or making decisions? N  Walking or climbing stairs? N  Dressing or bathing? N  Doing errands, shopping? N  Preparing Food and eating ? N  Using the Toilet? N  In the past six months, have you accidently leaked urine? N  Comment up at hs  Do you have problems with loss of bowel control? N  Managing your Medications? N  Managing your Finances? N  Housekeeping or managing your Housekeeping? N  Some recent data might be hidden    Patient Care Team: Dorena Cookey, MD as PCP - General   Assessment:   This is a routine wellness examination for Garrett Vargas.  Exercise Activities and Dietary recommendations Current Exercise Habits: Home exercise routine, Time (Minutes): 60, Frequency (Times/Week): 4, Weekly Exercise (Minutes/Week): 240, Intensity: Moderate  Goals    . patient     To maintain your health and exercise  Take time to travel and enjoy your time      . Patient Stated     Continue to do the things you do now        Fall Risk Fall Risk  07/05/2018 06/23/2017 05/09/2015 04/06/2014  Falls in the past year? 0 No No No    Depression Screen PHQ 2/9 Scores 07/05/2018 06/23/2017 05/09/2015 04/06/2014  PHQ - 2 Score 0 0 0 0    Cognitive Function MMSE - Mini  Mental State Exam 07/05/2018  Not completed: (No Data)   Ad8 score reviewed for issues:  Issues making decisions:  Less interest in hobbies / activities:  Repeats questions, stories (family complaining):  Trouble using ordinary gadgets (microwave, computer, phone):  Forgets the month or year:   Mismanaging finances:   Remembering appts:  Daily problems with thinking and/or memory: Ad8 score is=0        Immunization History  Administered Date(s) Administered  . Hepatitis A 07/02/2012, 01/10/2013  . Influenza Nasal 05/04/2017  . Influenza Split 04/27/2012  . Influenza Whole 05/04/2009  . Influenza, High Dose Seasonal PF 06/04/2018  . Influenza-Unspecified 05/04/2014, 05/07/2015, 04/28/2016  . PPD Test 04/16/2011, 04/27/2012, 04/27/2013, 04/11/2014  . Pneumococcal Conjugate-13 04/06/2014  . Pneumococcal Polysaccharide-23 12/10/2010  . Td 01/08/2005  . Tdap 05/09/2015  . Zoster 08/13/2009      Screening Tests Health Maintenance  Topic Date Due  . COLONOSCOPY  11/29/2018  . TETANUS/TDAP  05/08/2025  . INFLUENZA VACCINE  Completed  . Hepatitis C Screening  Completed  . PNA vac Low Risk Adult  Completed         Plan:      PCP Notes   Health Maintenance Educated regarding the shingrix   Abnormal Screens  none  Referrals  none  Patient concerns; none  Nurse Concerns; To see Dr. Ethlyn Gallery 07/14/2018 Can schedule AWV with Dr. Ethlyn Gallery annual apt next year     I have personally reviewed and noted the following in the patient's chart:   . Medical and social history . Use of alcohol, tobacco or illicit drugs  . Current medications and supplements . Functional ability and status . Nutritional status . Physical activity . Advanced directives . List of other physicians . Hospitalizations, surgeries, and  ER visits in previous 12 months . Vitals . Screenings to include cognitive, depression, and falls . Referrals and appointments  In  addition, I have reviewed and discussed with patient certain preventive protocols, quality metrics, and best practice recommendations. A written personalized care plan for preventive services as well as general preventive health recommendations were provided to patient.     KXFGH,WEXHB, RN  07/05/2018  I have personally reviewed the Annual Wellness Visit documentation and agree with plan listed above.  Micheline Rough, MD

## 2018-07-05 ENCOUNTER — Ambulatory Visit (INDEPENDENT_AMBULATORY_CARE_PROVIDER_SITE_OTHER): Payer: Medicare Other

## 2018-07-05 VITALS — BP 102/60 | HR 55 | Ht 74.0 in | Wt 185.0 lb

## 2018-07-05 DIAGNOSIS — Z0000 Encounter for general adult medical examination without abnormal findings: Secondary | ICD-10-CM

## 2018-07-05 NOTE — Patient Instructions (Addendum)
[name] , Thank you for taking time to come for your Medicare Wellness Visit. I appreciate your ongoing commitment to your health goals. Please review the following plan we discussed and let me know if I can assist you in the future.   Stay healthy!   Shingrix is a vaccine for the prevention of Shingles in Adults 50 and older.  If you are on Medicare, the shingrix is covered under your Part D plan, so you will take both of the vaccines in the series at your pharmacy. Please check with your benefits regarding applicable copays or out of pocket expenses.  The Shingrix is given in 2 vaccines approx 8 weeks apart. You must receive the 2nd dose prior to 6 months from receipt of the first. Please have the pharmacist print out you Immunization  dates for our office records    These are the goals we discussed: Goals    . patient     To maintain your health and exercise  Take time to travel and enjoy your time         This is a list of the screening recommended for you and due dates:  Health Maintenance  Topic Date Due  . Flu Shot  03/04/2018  . Colon Cancer Screening  11/29/2023  . Tetanus Vaccine  05/08/2025  .  Hepatitis C: One time screening is recommended by Center for Disease Control  (CDC) for  adults born from [birth year] through 1965.   Completed  . Pneumonia vaccines  Completed      Fall Prevention in the Home Falls can cause injuries. They can happen to people of all ages. There are many things you can do to make your home safe and to help prevent falls. What can I do on the outside of my home?  Regularly fix the edges of walkways and driveways and fix any cracks.  Remove anything that might make you trip as you walk through a door, such as a raised step or threshold.  Trim any bushes or trees on the path to your home.  Use bright outdoor lighting.  Clear any walking paths of anything that might make someone trip, such as rocks or tools.  Regularly check to see if  handrails are loose or broken. Make sure that both sides of any steps have handrails.  Any raised decks and porches should have guardrails on the edges.  Have any leaves, snow, or ice cleared regularly.  Use sand or salt on walking paths during winter.  Clean up any spills in your garage right away. This includes oil or grease spills. What can I do in the bathroom?  Use night lights.  Install grab bars by the toilet and in the tub and shower. Do not use towel bars as grab bars.  Use non-skid mats or decals in the tub or shower.  If you need to sit down in the shower, use a plastic, non-slip stool.  Keep the floor dry. Clean up any water that spills on the floor as soon as it happens.  Remove soap buildup in the tub or shower regularly.  Attach bath mats securely with double-sided non-slip rug tape.  Do not have throw rugs and other things on the floor that can make you trip. What can I do in the bedroom?  Use night lights.  Make sure that you have a light by your bed that is easy to reach.  Do not use any sheets or blankets that are too big  for your bed. They should not hang down onto the floor.  Have a firm chair that has side arms. You can use this for support while you get dressed.  Do not have throw rugs and other things on the floor that can make you trip. What can I do in the kitchen?  Clean up any spills right away.  Avoid walking on wet floors.  Keep items that you use a lot in easy-to-reach places.  If you need to reach something above you, use a strong step stool that has a grab bar.  Keep electrical cords out of the way.  Do not use floor polish or wax that makes floors slippery. If you must use wax, use non-skid floor wax.  Do not have throw rugs and other things on the floor that can make you trip. What can I do with my stairs?  Do not leave any items on the stairs.  Make sure that there are handrails on both sides of the stairs and use them. Fix  handrails that are broken or loose. Make sure that handrails are as long as the stairways.  Check any carpeting to make sure that it is firmly attached to the stairs. Fix any carpet that is loose or worn.  Avoid having throw rugs at the top or bottom of the stairs. If you do have throw rugs, attach them to the floor with carpet tape.  Make sure that you have a light switch at the top of the stairs and the bottom of the stairs. If you do not have them, ask someone to add them for you. What else can I do to help prevent falls?  Wear shoes that: ? Do not have high heels. ? Have rubber bottoms. ? Are comfortable and fit you well. ? Are closed at the toe. Do not wear sandals.  If you use a stepladder: ? Make sure that it is fully opened. Do not climb a closed stepladder. ? Make sure that both sides of the stepladder are locked into place. ? Ask someone to hold it for you, if possible.  Clearly mark and make sure that you can see: ? Any grab bars or handrails. ? First and last steps. ? Where the edge of each step is.  Use tools that help you move around (mobility aids) if they are needed. These include: ? Canes. ? Walkers. ? Scooters. ? Crutches.  Turn on the lights when you go into a dark area. Replace any light bulbs as soon as they burn out.  Set up your furniture so you have a clear path. Avoid moving your furniture around.  If any of your floors are uneven, fix them.  If there are any pets around you, be aware of where they are.  Review your medicines with your doctor. Some medicines can make you feel dizzy. This can increase your chance of falling. Ask your doctor what other things that you can do to help prevent falls. This information is not intended to replace advice given to you by your health care provider. Make sure you discuss any questions you have with your health care provider. Document Released: 05/17/2009 Document Revised: 12/27/2015 Document Reviewed:  08/25/2014 Elsevier Interactive Patient Education  2018 [place] Maintenance, Male A healthy lifestyle and preventive care is important for your health and wellness. Ask your health care provider about what schedule of regular examinations is right for you. What should I know about weight and diet? Eat a  Healthy Diet  Eat plenty of vegetables, fruits, whole grains, low-fat dairy products, and lean protein.  Do not eat a lot of foods high in solid fats, added sugars, or salt.  Maintain a Healthy Weight Regular exercise can help you achieve or maintain a healthy weight. You should:  Do at least 150 minutes of exercise each week. The exercise should increase your heart rate and make you sweat (moderate-intensity exercise).  Do strength-training exercises at least twice a week.  Watch Your Levels of Cholesterol and Blood Lipids  Have your blood tested for lipids and cholesterol every 5 years starting at 72 years of age. If you are at high risk for heart disease, you should start having your blood tested when you are 72 years old. You may need to have your cholesterol levels checked more often if: ? Your lipid or cholesterol levels are high. ? You are older than 72 years of age. ? You are at high risk for heart disease.  What should I know about cancer screening? Many types of cancers can be detected early and may often be prevented. Lung Cancer  You should be screened every year for lung cancer if: ? You are a current smoker who has smoked for at least 30 years. ? You are a former smoker who has quit within the past 15 years.  Talk to your health care provider about your screening options, when you should start screening, and how often you should be screened.  Colorectal Cancer  Routine colorectal cancer screening usually begins at 72 years of age and should be repeated every 5-10 years until you are 72 years old. You may need to be screened more often if early forms  of precancerous polyps or small growths are found. Your health care provider may recommend screening at an earlier age if you have risk factors for colon cancer.  Your health care provider may recommend using home test kits to check for hidden blood in the stool.  A small camera at the end of a tube can be used to examine your colon (sigmoidoscopy or colonoscopy). This checks for the earliest forms of colorectal cancer.  Prostate and Testicular Cancer  Depending on your age and overall health, your health care provider may do certain tests to screen for prostate and testicular cancer.  Talk to your health care provider about any symptoms or concerns you have about testicular or prostate cancer.  Skin Cancer  Check your skin from head to toe regularly.  Tell your health care provider about any new moles or changes in moles, especially if: ? There is a change in a mole's size, shape, or color. ? You have a mole that is larger than a pencil eraser.  Always use sunscreen. Apply sunscreen liberally and repeat throughout the day.  Protect yourself by wearing long sleeves, pants, a wide-brimmed hat, and sunglasses when outside.  What should I know about heart disease, diabetes, and high blood pressure?  If you are 27-81 years of age, have your blood pressure checked every 3-5 years. If you are 55 years of age or older, have your blood pressure checked every year. You should have your blood pressure measured twice-once when you are at a hospital or clinic, and once when you are not at a hospital or clinic. Record the average of the two measurements. To check your blood pressure when you are not at a hospital or clinic, you can use: ? An automated blood pressure machine at a pharmacy. ?  A home blood pressure monitor.  Talk to your health care provider about your target blood pressure.  If you are between 63-7 years old, ask your health care provider if you should take aspirin to prevent heart  disease.  Have regular diabetes screenings by checking your fasting blood sugar level. ? If you are at a normal weight and have a low risk for diabetes, have this test once every three years after the age of 53. ? If you are overweight and have a high risk for diabetes, consider being tested at a younger age or more often.  A one-time screening for abdominal aortic aneurysm (AAA) by ultrasound is recommended for men aged 33-75 years who are current or former smokers. What should I know about preventing infection? Hepatitis B If you have a higher risk for hepatitis B, you should be screened for this virus. Talk with your health care provider to find out if you are at risk for hepatitis B infection. Hepatitis C Blood testing is recommended for:  Everyone born from [birth year] through 1965.  Anyone with known risk factors for hepatitis C.  Sexually Transmitted Diseases (STDs)  You should be screened each year for STDs including gonorrhea and chlamydia if: ? You are sexually active and are younger than 72 years of age. ? You are older than 72 years of age and your health care provider tells you that you are at risk for this type of infection. ? Your sexual activity has changed since you were last screened and you are at an increased risk for chlamydia or gonorrhea. Ask your health care provider if you are at risk.  Talk with your health care provider about whether you are at high risk of being infected with HIV. Your health care provider may recommend a prescription medicine to help prevent HIV infection.  What else can I do?  Schedule regular health, dental, and eye exams.  Stay current with your vaccines (immunizations).  Do not use any tobacco products, such as cigarettes, chewing tobacco, and e-cigarettes. If you need help quitting, ask your health care provider.  Limit alcohol intake to no more than 2 drinks per day. One drink equals 12 ounces of beer, 5 ounces of wine, or 1 ounces of  hard liquor.  Do not use street drugs.  Do not share needles.  Ask your health care provider for help if you need support or information about quitting drugs.  Tell your health care provider if you often feel depressed.  Tell your health care provider if you have ever been abused or do not feel safe at home. This information is not intended to replace advice given to you by your health care provider. Make sure you discuss any questions you have with your health care provider. Document Released: 01/17/2008 Document Revised: 03/19/2016 Document Reviewed: 04/24/2015 Elsevier Interactive Patient Education  [employer].

## 2018-07-12 ENCOUNTER — Other Ambulatory Visit: Payer: Self-pay

## 2018-07-12 ENCOUNTER — Encounter: Payer: Self-pay | Admitting: Internal Medicine

## 2018-07-12 ENCOUNTER — Ambulatory Visit: Payer: Medicare Other | Admitting: Internal Medicine

## 2018-07-12 VITALS — BP 110/76 | HR 84 | Ht 74.0 in | Wt 184.0 lb

## 2018-07-12 DIAGNOSIS — R55 Syncope and collapse: Secondary | ICD-10-CM

## 2018-07-12 DIAGNOSIS — I480 Paroxysmal atrial fibrillation: Secondary | ICD-10-CM

## 2018-07-12 DIAGNOSIS — E7800 Pure hypercholesterolemia, unspecified: Secondary | ICD-10-CM

## 2018-07-12 DIAGNOSIS — I10 Essential (primary) hypertension: Secondary | ICD-10-CM

## 2018-07-12 DIAGNOSIS — I519 Heart disease, unspecified: Secondary | ICD-10-CM

## 2018-07-12 MED ORDER — ATENOLOL 25 MG PO TABS: ORAL_TABLET | ORAL | 4 refills | Status: DC

## 2018-07-12 MED ORDER — ATORVASTATIN CALCIUM 20 MG PO TABS: 20.0000 mg | ORAL_TABLET | Freq: Every day | ORAL | 4 refills | Status: DC

## 2018-07-12 NOTE — Progress Notes (Signed)
HPI Dr. Megan Salon returns today for ongoing evaluation and management of syncope, PAF, remote tachy induced CM which resolved, s/p insertion of an ILR. Since I saw him last, he has done well. No syncope. He has tolerated his atrial fib and is only out of rhythm about 1% of the time. No Known Allergies   Current Outpatient Medications  Medication Sig Dispense Refill  . amoxicillin (AMOXIL) 500 MG capsule Take 2,000 mg by mouth as needed (one hour prior to dental procedures).   0  . atenolol (TENORMIN) 25 MG tablet Take one half tablet by mouth daily. Patient may take an extra 1/2 tab daily for palpitations or heart racing. 50 tablet 4  . atorvastatin (LIPITOR) 20 MG tablet Take 1 tablet (20 mg total) by mouth daily. 90 tablet 4  . Multiple Vitamins-Minerals (CENTRUM SILVER PO) Take 1 capsule by mouth daily.    . Omega-3 Fatty Acids (FISH OIL PO) Take 1 capsule by mouth daily.     . rivaroxaban (XARELTO) 20 MG TABS tablet Take 1 tablet (20 mg total) by mouth daily with supper. 90 tablet 1  . tadalafil (CIALIS) 20 MG tablet 1 tablet 3-4 hours prior to sex 30 tablet 11   No current facility-administered medications for this visit.      Past Medical History:  Diagnosis Date  . A-fib ([place])   . ALLERGIC RHINITIS   . Arthritis   . Colon polyp   . Dysrhythmia   . Erectile dysfunction   . History of bronchitis as a child   . Hyperlipemia   . Hypertension   . S/P lateral meniscectomy of left knee     ROS:   All systems reviewed and negative except as noted in the HPI.   Past Surgical History:  Procedure Laterality Date  . CARDIOVERSION N/A 03/17/2014   Procedure: CARDIOVERSION;  Surgeon: Sanda Klein, MD;  Location: [facility];  Service: Cardiovascular;  Laterality: N/A;  . colonscopy     . LOOP RECORDER INSERTION N/A 04/12/2018   Procedure: LOOP RECORDER INSERTION;  Surgeon: Evans Lance, MD;  Location: [facility];  Service: Cardiovascular;  Laterality: N/A;  .  Multiple Knee,elbow and foot surgeries    . neuroma left foot,2010, Dr. Anne Fu    . TEE WITHOUT CARDIOVERSION N/A 03/17/2014   Procedure: TRANSESOPHAGEAL ECHOCARDIOGRAM (TEE);  Surgeon: Sanda Klein, MD;  Location: [facility];  Service: Cardiovascular;  Laterality: N/A;  . TOTAL HIP ARTHROPLASTY Left 07/18/2015   Procedure: LEFT TOTAL HIP ARTHROPLASTY ANTERIOR APPROACH;  Surgeon: Gaynelle Arabian, MD;  Location: [facility];  Service: Orthopedics;  Laterality: Left;     Family History  Problem Relation Age of Onset  . Coronary artery disease Unknown   . Prostate cancer Unknown   . Stroke Unknown   . Heart disease Mother      Social History   Socioeconomic History  . Marital status: Married    Spouse name: Not on file  . Number of children: Not on file  . Years of education: Not on file  . Highest education level: Not on file  Occupational History  . Occupation: retired Chief Financial Officer  . Occupation: former smoker  . Occupation: alocohol use-yes  . Occupation: Drug use-no  . Occupation: regular exercise-yes  Social Needs  . Financial resource strain: Not on file  . Food insecurity:    Worry: Not on file    Inability: Not on file  . Transportation needs:    Medical:  Not on file    Non-medical: Not on file  Tobacco Use  . Smoking status: Former Smoker    Packs/day: 0.50    Years: 10.00    Pack years: 5.00    Types: Cigarettes    Last attempt to quit: 08/05/1987    Years since quitting: 30.9  . Smokeless tobacco: Never Used  Substance and Sexual Activity  . Alcohol use: Yes    Alcohol/week: 0.0 standard drinks    Comment: occasional  . Drug use: No  . Sexual activity: Not on file  Lifestyle  . Physical activity:    Days per week: Not on file    Minutes per session: Not on file  . Stress: Not on file  Relationships  . Social connections:    Talks on phone: Not on file    Gets together: Not on file    Attends religious service: Not on file    Active member  of club or organization: Not on file    Attends meetings of clubs or organizations: Not on file    Relationship status: Not on file  . Intimate partner violence:    Fear of current or ex partner: Not on file    Emotionally abused: Not on file    Physically abused: Not on file    Forced sexual activity: Not on file  Other Topics Concern  . Not on file  Social History Narrative  . Not on file     BP 110/76   Pulse 84   Ht 6\' 2"  (1.88 m)   Wt 184 lb (83.5 kg)   SpO2 97%   BMI 23.62 kg/m   Physical Exam:  Well appearing NAD HEENT: Unremarkable Neck:  No JVD, no thyromegally Lymphatics:  No adenopathy Back:  No CVA tenderness Lungs:  Clear with no wheezes HEART:  Regular rate rhythm, no murmurs, no rubs, no clicks Abd:  soft, positive bowel sounds, no organomegally, no rebound, no guarding Ext:  2 plus pulses, no edema, no cyanosis, no clubbing Skin:  No rashes no nodules Neuro:  CN II through XII intact, motor grossly intact  EKG - none  DEVICE  Normal device function.  See PaceArt for details.   Assess/Plan: 1. PAF - he is maintaining NSR 99% of the time.  2. Syncope - he has had no recurrent symptoms.  3. HTN - his blood pressure is well controlled. We will follow.  Mikle Bosworth.D.

## 2018-07-12 NOTE — Patient Instructions (Addendum)

## 2018-07-13 ENCOUNTER — Encounter: Payer: Medicare Other | Admitting: Family Medicine

## 2018-07-13 LAB — CUP PACEART INCLINIC DEVICE CHECK
Implantable Pulse Generator Implant Date: 20190909
MDC IDC SESS DTM: 20191209154158

## 2018-07-14 ENCOUNTER — Ambulatory Visit (INDEPENDENT_AMBULATORY_CARE_PROVIDER_SITE_OTHER): Payer: Medicare Other | Admitting: Family Medicine

## 2018-07-14 ENCOUNTER — Encounter: Payer: Self-pay | Admitting: Family Medicine

## 2018-07-14 VITALS — BP 110/58 | HR 95 | Temp 98.2°F | Ht 74.0 in | Wt 184.9 lb

## 2018-07-14 DIAGNOSIS — I10 Essential (primary) hypertension: Secondary | ICD-10-CM

## 2018-07-14 DIAGNOSIS — E7800 Pure hypercholesterolemia, unspecified: Secondary | ICD-10-CM

## 2018-07-14 DIAGNOSIS — I480 Paroxysmal atrial fibrillation: Secondary | ICD-10-CM | POA: Diagnosis not present

## 2018-07-14 MED ORDER — ATORVASTATIN CALCIUM 20 MG PO TABS: 20.0000 mg | ORAL_TABLET | Freq: Every day | ORAL | 3 refills | Status: DC

## 2018-07-14 NOTE — Progress Notes (Signed)
Garrett Vargas DOB: [birth year] Encounter date: 07/14/2018  This is a 72 y.o. male who presents to establish care. Chief Complaint  Patient presents with  . Transitions Of Care    History of present illness: Following with cardiology for PAF, syncope, remote tachy induced CM which resolved s/p insertion ILR. In normal rhythm 99% of time. Has done well since this time. Atenolol was sent in for him by cardiology with some flexibility for dosing 1/2 - 1 tablet daily.   Taking niacin 1000 in evening and has been off of it for a year. Cost had gone up so he ended up stopping.  Not NPO  OBS:JGGEZ'M check very regularly at home. More monitors HR to keep in low 90s or less.   OQ:HUTML lipitor regularly.   Has had 4 L knee surgeries, L hip replacement, L rotator cuff surgery.   Sees Dr. Veleta Miners for derm. Has ophtholmology: Dr. Carolynn Sayers.   Past Medical History:  Diagnosis Date  . A-fib ([place])   . ALLERGIC RHINITIS   . Arthritis   . Colon polyp   . Dysrhythmia   . Erectile dysfunction   . History of bronchitis as a child   . Hyperlipemia   . Hypertension   . S/P lateral meniscectomy of left knee    Past Surgical History:  Procedure Laterality Date  . CARDIOVERSION N/A 03/17/2014   Procedure: CARDIOVERSION;  Surgeon: Sanda Klein, MD;  [facility];  Service: Cardiovascular;  Laterality: N/A;  . colonscopy     . ELBOW ARTHROSCOPY Bilateral   . LOOP RECORDER INSERTION N/A 04/12/2018   Procedure: LOOP RECORDER INSERTION;  Surgeon: Evans Lance, MD;  Location: [facility];  Service: Cardiovascular;  Laterality: N/A;  . Multiple Knee,elbow and foot surgeries    . neuroma left foot,2010, Dr. Anne Fu    . TEE WITHOUT CARDIOVERSION N/A 03/17/2014   Procedure: TRANSESOPHAGEAL ECHOCARDIOGRAM (TEE);  Surgeon: Sanda Klein, MD;  Location: [facility];  Service: Cardiovascular;  Laterality: N/A;  . TOTAL HIP ARTHROPLASTY Left 07/18/2015   Procedure: LEFT TOTAL HIP  ARTHROPLASTY ANTERIOR APPROACH;  Surgeon: Gaynelle Arabian, MD;  Location[facility];  Service: Orthopedics;  Laterality: Left;   No Known Allergies Current Meds  Medication Sig  . amoxicillin (AMOXIL) 500 MG capsule Take 2,000 mg by mouth as needed (one hour prior to dental procedures).   Marland Kitchen atenolol (TENORMIN) 25 MG tablet Take one half tablet by mouth daily. Patient may take an extra 1/2 tab daily for palpitations or heart racing.  Marland Kitchen atorvastatin (LIPITOR) 20 MG tablet Take 1 tablet (20 mg total) by mouth daily.  . Multiple Vitamins-Minerals (CENTRUM SILVER PO) Take 1 capsule by mouth daily.  . Omega-3 Fatty Acids (FISH OIL PO) Take 1 capsule by mouth daily.   . rivaroxaban (XARELTO) 20 MG TABS tablet Take 1 tablet (20 mg total) by mouth daily with supper.  . tadalafil (CIALIS) 20 MG tablet 1 tablet 3-4 hours prior to sex  . [DISCONTINUED] atorvastatin (LIPITOR) 20 MG tablet Take 1 tablet (20 mg total) by mouth daily.   Social History   Tobacco Use  . Smoking status: Former Smoker    Packs/day: 0.50    Years: 10.00    Pack years: 5.00    Types: Cigarettes    Last attempt to quit: 08/05/1987    Years since quitting: 30.9  . Smokeless tobacco: Never Used  Substance Use Topics  . Alcohol use: Yes    Alcohol/week: 0.0 standard drinks  Comment: occasional   Family History  Problem Relation Age of Onset  . Coronary artery disease Unknown   . Prostate cancer Unknown   . Stroke Unknown   . Heart disease Mother   . Stroke Mother 78  . Prostate cancer Father 6  . Kidney disease Sister      Review of Systems  Constitutional: Negative for chills, fatigue and fever.  Respiratory: Negative for cough, chest tightness, shortness of breath and wheezing.   Cardiovascular: Negative for chest pain, palpitations and leg swelling.    Objective:  BP (!) 110/58 (BP Location: Left Arm, Patient Position: Sitting, Cuff Size: Normal)   Pulse 95   Temp 98.2 F (36.8 C) (Oral)   Ht 6\' 2"  (1.88  m)   Wt 184 lb 14.4 oz (83.9 kg)   SpO2 95%   BMI 23.74 kg/m   Weight: 184 lb 14.4 oz (83.9 kg)   BP Readings from Last 3 Encounters:  07/14/18 (!) 110/58  07/12/18 110/76  07/05/18 102/60   Wt Readings from Last 3 Encounters:  07/14/18 184 lb 14.4 oz (83.9 kg)  07/12/18 184 lb (83.5 kg)  07/05/18 185 lb (83.9 kg)    Physical Exam  Constitutional: He is oriented to person, place, and time. He appears well-developed and well-nourished. No distress.  Cardiovascular: Normal rate, regular rhythm and normal heart sounds. Exam reveals no friction rub.  No murmur heard. No lower extremity edema  Pulmonary/Chest: Effort normal and breath sounds normal. No respiratory distress. He has no wheezes. He has no rales.  Neurological: He is alert and oriented to person, place, and time.  Psychiatric: His behavior is normal. Cognition and memory are normal.    Assessment/Plan:  1. Essential hypertension, benign Stable. Continue current medications.   2. Pure hypercholesterolemia Will recheck bloodwork when able.    Return in about 6 months (around 01/13/2019) for physical exam (schedule bloodwork in next 2 weeks).  Micheline Rough, MD

## 2018-07-15 LAB — COMPREHENSIVE METABOLIC PANEL
ALBUMIN: 4.2 g/dL (ref 3.5–5.2)
ALT: 24 U/L (ref 0–53)
AST: 23 U/L (ref 0–37)
Alkaline Phosphatase: 66 U/L (ref 39–117)
BILIRUBIN TOTAL: 1.2 mg/dL (ref 0.2–1.2)
BUN: 13 mg/dL (ref 6–23)
CO2: 31 meq/L (ref 19–32)
CREATININE: 0.84 mg/dL (ref 0.40–1.50)
Calcium: 8.9 mg/dL (ref 8.4–10.5)
Chloride: 102 mEq/L (ref 96–112)
GFR: 95.23 mL/min (ref >60.00)
GLUCOSE: 89 mg/dL (ref 70–99)
Potassium: 4.1 mEq/L (ref 3.5–5.1)
SODIUM: 138 meq/L (ref 135–145)
Total Protein: 6.4 g/dL (ref 6.0–8.3)

## 2018-07-15 LAB — LIPID PANEL
Cholesterol: 167 mg/dL (ref 0–200)
HDL: 31.8 mg/dL — ABNORMAL LOW (ref >39.00)
LDL CALC: 116 mg/dL — AB (ref 0–99)
NONHDL: 135.19
Total CHOL/HDL Ratio: 5
Triglycerides: 98 mg/dL (ref 0.0–149.0)
VLDL: 19.6 mg/dL (ref 0.0–40.0)

## 2018-07-15 LAB — CBC WITH DIFFERENTIAL/PLATELET
BASOS PCT: 0.4 % (ref 0.0–3.0)
Basophils Absolute: 0 10*3/uL (ref 0.0–0.1)
Eosinophils Absolute: 0.4 10*3/uL (ref 0.0–0.7)
Eosinophils Relative: 5.5 % — ABNORMAL HIGH (ref 0.0–5.0)
HEMATOCRIT: 46 % (ref 39.0–52.0)
Hemoglobin: 15.8 g/dL (ref 13.0–17.0)
LYMPHS PCT: 35.2 % (ref 12.0–46.0)
Lymphs Abs: 2.5 10*3/uL (ref 0.7–4.0)
MCHC: 34.4 g/dL (ref 30.0–36.0)
MCV: 89 fl (ref 78.0–100.0)
MONO ABS: 0.5 10*3/uL (ref 0.1–1.0)
MONOS PCT: 7.4 % (ref 3.0–12.0)
NEUTROS ABS: 3.7 10*3/uL (ref 1.4–7.7)
NEUTROS PCT: 51.5 % (ref 43.0–77.0)
Platelets: 234 10*3/uL (ref 150.0–400.0)
RBC: 5.17 Mil/uL (ref 4.22–5.81)
RDW: 13.2 % (ref 11.5–15.5)
WBC: 7.2 10*3/uL (ref 4.0–10.5)

## 2018-07-15 LAB — TSH: TSH: 2.47 u[IU]/mL (ref 0.35–4.50)

## 2018-07-15 NOTE — Addendum Note (Signed)
Addended by: Elmer Picker on: 07/15/2018 07:49 AM   Modules accepted: Orders

## 2018-07-20 ENCOUNTER — Ambulatory Visit (INDEPENDENT_AMBULATORY_CARE_PROVIDER_SITE_OTHER): Payer: Medicare Other

## 2018-07-20 DIAGNOSIS — R55 Syncope and collapse: Secondary | ICD-10-CM | POA: Diagnosis not present

## 2018-07-20 NOTE — Progress Notes (Signed)
[facility] Summary Report / Loop Recorder 

## 2018-07-26 ENCOUNTER — Encounter: Payer: Self-pay | Admitting: Family Medicine

## 2018-07-30 ENCOUNTER — Encounter: Payer: Self-pay | Admitting: Family Medicine

## 2018-08-05 LAB — CUP PACEART REMOTE DEVICE CHECK
Date Time Interrogation Session: 20191114123853
MDC IDC PG IMPLANT DT: 20190909

## 2018-08-14 LAB — CUP PACEART REMOTE DEVICE CHECK
Implantable Pulse Generator Implant Date: 20190909
MDC IDC SESS DTM: 20191217130942

## 2018-08-23 ENCOUNTER — Ambulatory Visit (INDEPENDENT_AMBULATORY_CARE_PROVIDER_SITE_OTHER): Payer: Medicare Other

## 2018-08-23 DIAGNOSIS — R55 Syncope and collapse: Secondary | ICD-10-CM | POA: Diagnosis not present

## 2018-08-23 LAB — CUP PACEART REMOTE DEVICE CHECK
Implantable Pulse Generator Implant Date: 20190909
MDC IDC SESS DTM: 20200119130752

## 2018-08-23 NOTE — Progress Notes (Signed)
[facility] Summary Report / Loop Recorder 

## 2018-09-07 ENCOUNTER — Ambulatory Visit: Payer: Medicare Other | Admitting: Internal Medicine

## 2018-09-24 ENCOUNTER — Ambulatory Visit (INDEPENDENT_AMBULATORY_CARE_PROVIDER_SITE_OTHER): Payer: Medicare Other

## 2018-09-24 DIAGNOSIS — R55 Syncope and collapse: Secondary | ICD-10-CM | POA: Diagnosis not present

## 2018-09-25 LAB — CUP PACEART REMOTE DEVICE CHECK
Date Time Interrogation Session: 20200221130636
MDC IDC PG IMPLANT DT: 20190909

## 2018-09-30 NOTE — Progress Notes (Signed)
[facility] Summary Report / Loop Recorder 

## 2018-10-06 ENCOUNTER — Other Ambulatory Visit: Payer: Self-pay | Admitting: Internal Medicine

## 2018-10-06 NOTE — Telephone Encounter (Signed)
Age 73, weight 84kg, SCr 0.84 on 07/15/18, CrCl 93

## 2018-10-11 ENCOUNTER — Emergency Department (HOSPITAL_COMMUNITY)
Admission: EM | Admit: 2018-10-11 | Discharge: 2018-10-11 | Disposition: A | Discharge status: Home / Self Care | Payer: Medicare Other | Attending: Emergency Medicine | Admitting: Emergency Medicine

## 2018-10-11 ENCOUNTER — Emergency Department (HOSPITAL_COMMUNITY): Payer: Medicare Other

## 2018-10-11 ENCOUNTER — Encounter (HOSPITAL_COMMUNITY): Payer: Self-pay

## 2018-10-11 DIAGNOSIS — Z96642 Presence of left artificial hip joint: Secondary | ICD-10-CM | POA: Diagnosis not present

## 2018-10-11 DIAGNOSIS — Z87891 Personal history of nicotine dependence: Secondary | ICD-10-CM | POA: Insufficient documentation

## 2018-10-11 DIAGNOSIS — Z79899 Other long term (current) drug therapy: Secondary | ICD-10-CM | POA: Insufficient documentation

## 2018-10-11 DIAGNOSIS — R42 Dizziness and giddiness: Secondary | ICD-10-CM | POA: Diagnosis not present

## 2018-10-11 DIAGNOSIS — Z95818 Presence of other cardiac implants and grafts: Secondary | ICD-10-CM | POA: Diagnosis not present

## 2018-10-11 DIAGNOSIS — R002 Palpitations: Secondary | ICD-10-CM | POA: Diagnosis present

## 2018-10-11 DIAGNOSIS — R531 Weakness: Secondary | ICD-10-CM | POA: Diagnosis not present

## 2018-10-11 DIAGNOSIS — Z7901 Long term (current) use of anticoagulants: Secondary | ICD-10-CM | POA: Diagnosis not present

## 2018-10-11 DIAGNOSIS — I10 Essential (primary) hypertension: Secondary | ICD-10-CM | POA: Insufficient documentation

## 2018-10-11 DIAGNOSIS — I480 Paroxysmal atrial fibrillation: Secondary | ICD-10-CM | POA: Diagnosis not present

## 2018-10-11 DIAGNOSIS — R55 Syncope and collapse: Secondary | ICD-10-CM | POA: Diagnosis not present

## 2018-10-11 DIAGNOSIS — I4510 Unspecified right bundle-branch block: Secondary | ICD-10-CM | POA: Diagnosis not present

## 2018-10-11 DIAGNOSIS — R0902 Hypoxemia: Secondary | ICD-10-CM | POA: Diagnosis not present

## 2018-10-11 LAB — BASIC METABOLIC PANEL
Anion gap: 8 (ref 5–15)
BUN: 14 mg/dL (ref 8–23)
CALCIUM: 8.8 mg/dL — AB (ref 8.9–10.3)
CHLORIDE: 106 mmol/L (ref 98–111)
CO2: 26 mmol/L (ref 22–32)
CREATININE: 0.87 mg/dL (ref 0.61–1.24)
GFR calc non Af Amer: >60 mL/min (ref >60)
Glucose, Bld: 114 mg/dL — ABNORMAL HIGH (ref 70–99)
Potassium: 4.2 mmol/L (ref 3.5–5.1)
Sodium: 140 mmol/L (ref 135–145)

## 2018-10-11 LAB — CBC WITH DIFFERENTIAL/PLATELET
Abs Immature Granulocytes: 0.02 10*3/uL (ref 0.00–0.07)
Basophils Absolute: 0 10*3/uL (ref 0.0–0.1)
Basophils Relative: 1 %
EOS ABS: 0.2 10*3/uL (ref 0.0–0.5)
EOS PCT: 4 %
HCT: 51.8 % (ref 39.0–52.0)
HEMOGLOBIN: 16.3 g/dL (ref 13.0–17.0)
Immature Granulocytes: 0 %
LYMPHS ABS: 2 10*3/uL (ref 0.7–4.0)
LYMPHS PCT: 34 %
MCH: 29.4 pg (ref 26.0–34.0)
MCHC: 31.5 g/dL (ref 30.0–36.0)
MCV: 93.5 fL (ref 80.0–100.0)
Monocytes Absolute: 0.8 10*3/uL (ref 0.1–1.0)
Monocytes Relative: 13 %
Neutro Abs: 2.7 10*3/uL (ref 1.7–7.7)
Neutrophils Relative %: 48 %
Platelets: 176 10*3/uL (ref 150–400)
RBC: 5.54 MIL/uL (ref 4.22–5.81)
RDW: 13.3 % (ref 11.5–15.5)
WBC: 5.7 10*3/uL (ref 4.0–10.5)
nRBC: 0 % (ref 0.0–0.2)

## 2018-10-11 LAB — TROPONIN I: Troponin I: <0.03 ng/mL (ref <0.03)

## 2018-10-11 MED ORDER — SODIUM CHLORIDE 0.9 % IV BOLUS
1000.0000 mL | Freq: Once | INTRAVENOUS | Status: AC
  Administered 2018-10-11: 1000 mL via INTRAVENOUS

## 2018-10-11 NOTE — Discharge Instructions (Addendum)
Follow-up with Dr. Lovena Le in the next week, and return to the ER if you experience any new and/or concerning symptoms.

## 2018-10-11 NOTE — ED Notes (Signed)
Recorder interrogated.

## 2018-10-11 NOTE — ED Triage Notes (Signed)
Patient arrived via [facility] from Home.   Taking a shower, got light headed and sat down.  This has happened a while back.   Wife went into bathroom to check on him and saw patient sitting in shower. Wife called EMS. Patient got dressed before ems arrived.   BP with EMS- 104/40 (Standing) 110/74 (lying)  18g left hand 500 NS   BP after fluids 110/76\  A/Ox4   Take atenolol and had recent medication change 25 to 12.5  Hx. A.fibb   Patient states sometimes his a.fib makes him feel this way. Not in a.fibb at this time.   Right bundle branch block and had loop cardiac monitor at home (put on after last syncopal episode to monitor heart)  [id number]

## 2018-10-11 NOTE — ED Notes (Signed)
Patient transported to X-ray 

## 2018-10-11 NOTE — ED Notes (Signed)
Bed: PT47 Expected date:  Expected time:  Means of arrival:  Comments: EMS syncope

## 2018-10-11 NOTE — ED Provider Notes (Signed)
[facility] DEPT Provider Note   CSN: [id number] Arrival date & time: 10/11/18  1012    History   Chief Complaint Chief Complaint  Patient presents with  . Hypotension  . Near Syncope    HPI Garrett Vargas is a 73 y.o. male.     Patient is a 73 year old male with a past medical history of paroxysmal atrial fibrillation, hypertension, hyperlipidemia.  He has an implanted loop recorder.  He presents today with complaints of palpitations, weakness, and near syncope.  He said he started yesterday evening with palpitations and believes he was in A. fib.  He went to bed, then woke up this morning feeling better.  While he was in the shower he states he became lightheaded and near syncopal.  He had to sit down in the shower until this sensation began to resolve.  His wife ended up calling 911 and he was transported here.  Told his vital signs were orthostatic with EMS.  He denies any fevers or chills.  He denies any chest pain or difficulty breathing.  He denies any recent vomiting, diarrhea, or black or bloody stools.  The history is provided by the patient.  Near Syncope  This is a new problem. The current episode started 1 to 2 hours ago. The problem occurs constantly. The problem has been resolved. Pertinent negatives include no chest pain and no shortness of breath. Nothing aggravates the symptoms. Nothing relieves the symptoms. He has tried nothing for the symptoms.    Past Medical History:  Diagnosis Date  . A-fib ([place])   . ALLERGIC RHINITIS   . Arthritis   . Colon polyp   . Dysrhythmia   . Erectile dysfunction   . History of bronchitis as a child   . Hyperlipemia   . Hypertension   . S/P lateral meniscectomy of left knee     Patient Active Problem List   Diagnosis Date Noted  . Syncope and collapse 04/12/2018  . BPH associated with nocturia 04/06/2014  . HEMATURIA, HX OF 01/04/2009  . ESSENTIAL HYPERTENSION, BENIGN 12/04/2008  .  Hyperlipemia 01/03/2008  . ERECTILE DYSFUNCTION, MILD 01/03/2008  . Allergic rhinitis 01/03/2008    Past Surgical History:  Procedure Laterality Date  . CARDIOVERSION N/A 03/17/2014   Procedure: CARDIOVERSION;  Surgeon: Sanda Klein, MD;  [facility];  Service: Cardiovascular;  Laterality: N/A;  . colonscopy     . ELBOW ARTHROSCOPY Bilateral   . LOOP RECORDER INSERTION N/A 04/12/2018   Procedure: LOOP RECORDER INSERTION;  Surgeon: Evans Lance, MD;  Location: [facility];  Service: Cardiovascular;  Laterality: N/A;  . Multiple Knee,elbow and foot surgeries    . neuroma left foot,2010, Dr. Anne Fu    . TEE WITHOUT CARDIOVERSION N/A 03/17/2014   Procedure: TRANSESOPHAGEAL ECHOCARDIOGRAM (TEE);  Surgeon: Sanda Klein, MD;  Location: [facility];  Service: Cardiovascular;  Laterality: N/A;  . TOTAL HIP ARTHROPLASTY Left 07/18/2015   Procedure: LEFT TOTAL HIP ARTHROPLASTY ANTERIOR APPROACH;  Surgeon: Gaynelle Arabian, MD;  Location: [facility];  Service: Orthopedics;  Laterality: Left;        Home Medications    Prior to Admission medications   Medication Sig Start Date End Date Taking? Authorizing Provider  amoxicillin (AMOXIL) 500 MG capsule Take 2,000 mg by mouth as needed (one hour prior to dental procedures).  05/24/18   [provider]  atenolol (TENORMIN) 25 MG tablet Take one half tablet by mouth daily. Patient may take an extra 1/2  tab daily for palpitations or heart racing. 07/12/18   Evans Lance, MD  atorvastatin (LIPITOR) 20 MG tablet Take 1 tablet (20 mg total) by mouth daily. 07/14/18   Caren Macadam, MD  Multiple Vitamins-Minerals (CENTRUM SILVER PO) Take 1 capsule by mouth daily.    [provider]  Omega-3 Fatty Acids (FISH OIL PO) Take 1 capsule by mouth daily.     [provider]  tadalafil (CIALIS) 20 MG tablet 1 tablet 3-4 hours prior to sex 06/29/17   Dorena Cookey, MD  XARELTO 20 MG TABS tablet TAKE 1  TABLET BY MOUTH  DAILY WITH SUPPER 10/06/18   Evans Lance, MD    Family History Family History  Problem Relation Age of Onset  . Coronary artery disease Other   . Prostate cancer Other   . Stroke Other   . Heart disease Mother   . Stroke Mother 64  . Prostate cancer Father 47  . Kidney disease Sister     Social History Social History   Tobacco Use  . Smoking status: Former Smoker    Packs/day: 0.50    Years: 10.00    Pack years: 5.00    Types: Cigarettes    Last attempt to quit: 08/05/1987    Years since quitting: 31.2  . Smokeless tobacco: Never Used  Substance Use Topics  . Alcohol use: Yes    Alcohol/week: 0.0 standard drinks    Comment: occasional  . Drug use: No     Allergies   Patient has no known allergies.   Review of Systems Review of Systems  Respiratory: Negative for shortness of breath.   Cardiovascular: Positive for near-syncope. Negative for chest pain.  All other systems reviewed and are negative.    Physical Exam Updated Vital Signs SpO2 98%   Physical Exam Vitals signs and nursing note reviewed.  Constitutional:      General: He is not in acute distress.    Appearance: He is well-developed. He is not diaphoretic.  HENT:     Head: Normocephalic and atraumatic.  Neck:     Musculoskeletal: Normal range of motion and neck supple.  Cardiovascular:     Rate and Rhythm: Normal rate and regular rhythm.     Heart sounds: No murmur. No friction rub.  Pulmonary:     Effort: Pulmonary effort is normal. No respiratory distress.     Breath sounds: Normal breath sounds. No wheezing or rales.  Abdominal:     General: Bowel sounds are normal. There is no distension.     Palpations: Abdomen is soft.     Tenderness: There is no abdominal tenderness.  Musculoskeletal: Normal range of motion.        General: No swelling or tenderness.     Right lower leg: No edema.     Left lower leg: No edema.  Skin:    General: Skin is warm and dry.    Neurological:     Mental Status: He is alert and oriented to person, place, and time.     Coordination: Coordination normal.      ED Treatments / Results  Labs (all labs ordered are listed, but only abnormal results are displayed) Labs Reviewed  BASIC METABOLIC PANEL  CBC WITH DIFFERENTIAL/PLATELET  TROPONIN I    EKG EKG Interpretation  Date/Time:  Monday October 11 2018 10:45:15 EDT Ventricular Rate:  80 PR Interval:    QRS Duration: 145 QT Interval:  415 QTC Calculation: 479 R Axis:  56 Text Interpretation:  Sinus rhythm Right bundle branch block Confirmed by Veryl Speak [id number]) on 10/11/2018 10:52:36 AM   Radiology No results found.  Procedures Procedures (including critical care time)  Medications Ordered in ED Medications  sodium chloride 0.9 % bolus 1,000 mL (has no administration in time range)     Initial Impression / Assessment and Plan / ED Course  I have reviewed the triage vital signs and the nursing notes.  Pertinent labs & imaging results that were available during my care of the patient were reviewed by me and considered in my medical decision making (see chart for details).  Patient is a 73 year old male with history of paroxysmal atrial fibrillation with implanted loop recorder.  He presents today for evaluation of near syncope that occurred this morning while in the shower.  He reports several episodes of palpitations last night, then again this morning.  He arrives here in sinus rhythm with no laboratory abnormalities.  His loop recorder was interrogated and showed several episodes of A. fib last night, and again this morning.  This seems to coincide with the episode of near syncope he experienced in the shower.  His wife describes him as sitting in the shower and very sluggishly responsive.  I highly doubt stroke in this situation as the patient had no other neurologic deficits, woke up quickly, and is also taking Xarelto.  I feel as though the  patient is appropriate for discharge with follow-up with Dr. Lovena Le, his cardiologist in the next week.  He is to return to the ER if symptoms worsen.  Final Clinical Impressions(s) / ED Diagnoses   Final diagnoses:  None    ED Discharge Orders    None       Veryl Speak, MD 10/11/18 1259

## 2018-10-14 ENCOUNTER — Encounter: Payer: Self-pay | Admitting: Internal Medicine

## 2018-10-14 ENCOUNTER — Ambulatory Visit: Payer: Medicare Other | Admitting: Internal Medicine

## 2018-10-14 ENCOUNTER — Other Ambulatory Visit: Payer: Self-pay

## 2018-10-14 VITALS — BP 110/68 | HR 82 | Ht 74.0 in | Wt 184.2 lb

## 2018-10-14 DIAGNOSIS — I480 Paroxysmal atrial fibrillation: Secondary | ICD-10-CM

## 2018-10-14 DIAGNOSIS — I484 Atypical atrial flutter: Secondary | ICD-10-CM

## 2018-10-14 DIAGNOSIS — R55 Syncope and collapse: Secondary | ICD-10-CM

## 2018-10-14 MED ORDER — FLECAINIDE ACETATE 50 MG PO TABS: 50.0000 mg | ORAL_TABLET | Freq: Two times a day (BID) | ORAL | 3 refills | Status: DC

## 2018-10-14 NOTE — Progress Notes (Signed)
HPI Dr. Megan Salon returns today for ongoing evaluation and management of his atrial arrhythmias. The patient has a h/o tach induced CM remotely and went into atrial fib and had unexplained syncope and underwent ILR insertion. The patient was in the ED a few days ago with atrial fib and a RVR associated with near syncope. He spontaneously reverted to NSR after being taken to the ED. He has had some uptick in his amount of atrial fib. He denies non-compliance.  No Known Allergies   Current Outpatient Medications  Medication Sig Dispense Refill  . atenolol (TENORMIN) 25 MG tablet Take one half tablet by mouth daily. Patient may take an extra 1/2 tab daily for palpitations or heart racing. (Patient taking differently: Take 12.5 mg by mouth daily. Take one half tablet by mouth daily. Patient may take an extra 1/2 tab daily for palpitations or heart racing.) 50 tablet 4  . atorvastatin (LIPITOR) 20 MG tablet Take 1 tablet (20 mg total) by mouth daily. 90 tablet 3  . Multiple Vitamins-Minerals (CENTRUM SILVER PO) Take 1 capsule by mouth daily.    . niacin 500 MG tablet Take 500 mg by mouth at bedtime.    . Omega-3 Fatty Acids (FISH OIL PO) Take 1 capsule by mouth daily.     Marland Kitchen omeprazole (PRILOSEC) 20 MG capsule Take 20 mg by mouth daily.    . tadalafil (CIALIS) 20 MG tablet 1 tablet 3-4 hours prior to sex 30 tablet 11  . XARELTO 20 MG TABS tablet TAKE 1 TABLET BY MOUTH  DAILY WITH SUPPER (Patient taking differently: Take 20 mg by mouth daily with supper. ) 90 tablet 1  . flecainide (TAMBOCOR) 50 MG tablet Take 1 tablet (50 mg total) by mouth 2 (two) times daily. 180 tablet 3   No current facility-administered medications for this visit.      Past Medical History:  Diagnosis Date  . A-fib ([place])   . ALLERGIC RHINITIS   . Arthritis   . Colon polyp   . Dysrhythmia   . Erectile dysfunction   . History of bronchitis as a child   . Hyperlipemia   . Hypertension   . S/P lateral  meniscectomy of left knee     ROS:   All systems reviewed and negative except as noted in the HPI.   Past Surgical History:  Procedure Laterality Date  . CARDIOVERSION N/A 03/17/2014   Procedure: CARDIOVERSION;  Surgeon: Sanda Klein, MD;  [facility];  Service: Cardiovascular;  Laterality: N/A;  . colonscopy     . ELBOW ARTHROSCOPY Bilateral   . LOOP RECORDER INSERTION N/A 04/12/2018   Procedure: LOOP RECORDER INSERTION;  Surgeon: Evans Lance, MD;  Location: [facility];  Service: Cardiovascular;  Laterality: N/A;  . Multiple Knee,elbow and foot surgeries    . neuroma left foot,2010, Dr. Anne Fu    . TEE WITHOUT CARDIOVERSION N/A 03/17/2014   Procedure: TRANSESOPHAGEAL ECHOCARDIOGRAM (TEE);  Surgeon: Sanda Klein, MD;  Location: [facility];  Service: Cardiovascular;  Laterality: N/A;  . TOTAL HIP ARTHROPLASTY Left 07/18/2015   Procedure: LEFT TOTAL HIP ARTHROPLASTY ANTERIOR APPROACH;  Surgeon: Gaynelle Arabian, MD;  Location: [facility];  Service: Orthopedics;  Laterality: Left;     Family History  Problem Relation Age of Onset  . Coronary artery disease Other   . Prostate cancer Other   . Stroke Other   . Heart disease Mother   . Stroke Mother 46  . Prostate cancer Father  41  . Kidney disease Sister      Social History   Socioeconomic History  . Marital status: Married    Spouse name: Not on file  . Number of children: Not on file  . Years of education: Not on file  . Highest education level: Not on file  Occupational History  . Occupation: retired Chief Financial Officer  . Occupation: former smoker  . Occupation: alocohol use-yes  . Occupation: Drug use-no  . Occupation: regular exercise-yes  Social Needs  . Financial resource strain: Not on file  . Food insecurity:    Worry: Not on file    Inability: Not on file  . Transportation needs:    Medical: Not on file    Non-medical: Not on file  Tobacco Use  . Smoking status: Former Smoker     Packs/day: 0.50    Years: 10.00    Pack years: 5.00    Types: Cigarettes    Last attempt to quit: 08/05/1987    Years since quitting: 31.2  . Smokeless tobacco: Never Used  Substance and Sexual Activity  . Alcohol use: Yes    Alcohol/week: 0.0 standard drinks    Comment: occasional  . Drug use: No  . Sexual activity: Not on file  Lifestyle  . Physical activity:    Days per week: Not on file    Minutes per session: Not on file  . Stress: Not on file  Relationships  . Social connections:    Talks on phone: Not on file    Gets together: Not on file    Attends religious service: Not on file    Active member of club or organization: Not on file    Attends meetings of clubs or organizations: Not on file    Relationship status: Not on file  . Intimate partner violence:    Fear of current or ex partner: Not on file    Emotionally abused: Not on file    Physically abused: Not on file    Forced sexual activity: Not on file  Other Topics Concern  . Not on file  Social History Narrative  . Not on file     BP 110/68   Pulse 82   Ht 6\' 2"  (1.88 m)   Wt 184 lb 3.2 oz (83.6 kg)   SpO2 93%   BMI 23.65 kg/m   Physical Exam:  Well appearing NAD HEENT: Unremarkable Neck:  No JVD, no thyromegally Lymphatics:  No adenopathy Back:  No CVA tenderness Lungs:  Clear with no wheezes HEART:  Regular rate rhythm, no murmurs, no rubs, no clicks Abd:  soft, positive bowel sounds, no organomegally, no rebound, no guarding Ext:  2 plus pulses, no edema, no cyanosis, no clubbing Skin:  No rashes no nodules Neuro:  CN II through XII intact, motor grossly intact  EKG - nsr  DEVICE  Normal device function.  See PaceArt for details. Atrial fib is present.   Assess/Plan: 1. PAF - we discussed the treatment options and because he has had more atrial fib, will start flecainide in low dose and slowly plan to uptitrate upwards. 2. Syncope - he almost passed out with his rapid atrial fib. No  pauses.  3. HTN - his blood pressure is well controlled. No change in the bp meds. I spent over 25 minutes including 50% face to face time with the patient.  Garrett Vargas.D.

## 2018-10-14 NOTE — Patient Instructions (Signed)
Medication Instructions:  Your physician has recommended you make the following change in your medication:  1.) start flecainide 50 mg by mouth twice a day  If you need a refill on your cardiac medications before your next appointment, please call your pharmacy.   Lab work: none If you have labs (blood work) drawn today and your tests are completely normal, you will receive your results only by: Marland Kitchen MyChart Message (if you have MyChart) OR . A paper copy in the mail If you have any lab test that is abnormal or we need to change your treatment, we will call you to review the results.  Testing/Procedures:   Follow-Up: Your physician recommends that you schedule a follow-up appointment with the nurse for EKG in 2 weeks. Your physician recommends that you schedule a follow-up appointment with Dr. Lovena Le in 2 months.  Any Other Special Instructions Will Be Listed Below (If Applicable).

## 2018-10-19 LAB — CUP PACEART INCLINIC DEVICE CHECK
Date Time Interrogation Session: 20200312174817
Implantable Pulse Generator Implant Date: 20190909

## 2018-10-27 ENCOUNTER — Ambulatory Visit (INDEPENDENT_AMBULATORY_CARE_PROVIDER_SITE_OTHER): Payer: Medicare Other | Admitting: *Deleted

## 2018-10-27 ENCOUNTER — Other Ambulatory Visit: Payer: Self-pay

## 2018-10-27 DIAGNOSIS — R55 Syncope and collapse: Secondary | ICD-10-CM

## 2018-10-27 LAB — CUP PACEART REMOTE DEVICE CHECK
Date Time Interrogation Session: 20200325145424
Implantable Pulse Generator Implant Date: 20190909

## 2018-10-29 ENCOUNTER — Ambulatory Visit: Payer: Medicare Other

## 2018-11-01 NOTE — Progress Notes (Signed)
[facility] Summary Report / Loop Recorder 

## 2018-11-29 ENCOUNTER — Other Ambulatory Visit: Payer: Self-pay

## 2018-11-29 ENCOUNTER — Ambulatory Visit (INDEPENDENT_AMBULATORY_CARE_PROVIDER_SITE_OTHER): Payer: Medicare Other | Admitting: *Deleted

## 2018-11-29 DIAGNOSIS — R55 Syncope and collapse: Secondary | ICD-10-CM | POA: Diagnosis not present

## 2018-11-29 DIAGNOSIS — I480 Paroxysmal atrial fibrillation: Secondary | ICD-10-CM

## 2018-11-29 LAB — CUP PACEART REMOTE DEVICE CHECK
Date Time Interrogation Session: 20200427184026
Implantable Pulse Generator Implant Date: 20190909

## 2018-12-06 NOTE — Progress Notes (Signed)
[facility] Summary Report / Loop Recorder 

## 2018-12-08 ENCOUNTER — Other Ambulatory Visit: Payer: Self-pay | Admitting: Internal Medicine

## 2018-12-08 DIAGNOSIS — I10 Essential (primary) hypertension: Secondary | ICD-10-CM

## 2018-12-16 ENCOUNTER — Telehealth: Payer: Self-pay | Admitting: Internal Medicine

## 2018-12-16 NOTE — Telephone Encounter (Signed)
New message    Spoke w/pt about appt on 05.19.20. Pt will do video visit through doxemity with Dr. Lovena Le. Pt smart phone number is listed in appt notes.      Virtual Visit Pre-Appointment Phone Call  "(Name), I am calling you today to discuss your upcoming appointment. We are currently trying to limit exposure to the virus that causes COVID-19 by seeing patients at home rather than in the office."  1. "What is the BEST phone number to call the day of the visit?" - include this in appointment notes  2. Do you have or have access to (through a family member/friend) a smartphone with video capability that we can use for your visit?" a. If yes - list this number in appt notes as cell (if different from BEST phone #) and list the appointment type as a VIDEO visit in appointment notes b. If no - list the appointment type as a PHONE visit in appointment notes  3. Confirm consent - "In the setting of the current Covid19 crisis, you are scheduled for a (phone or video) visit with your provider on (date) at (time).  Just as we do with many in-office visits, in order for you to participate in this visit, we must obtain consent.  If you'd like, I can send this to your mychart (if signed up) or email for you to review.  Otherwise, I can obtain your verbal consent now.  All virtual visits are billed to your insurance company just like a normal visit would be.  By agreeing to a virtual visit, we'd like you to understand that the technology does not allow for your provider to perform an examination, and thus may limit your provider's ability to fully assess your condition. If your provider identifies any concerns that need to be evaluated in person, we will make arrangements to do so.  Finally, though the technology is pretty good, we cannot assure that it will always work on either your or our end, and in the setting of a video visit, we may have to convert it to a phone-only visit.  In either situation, we  cannot ensure that we have a secure connection.  Are you willing to proceed?" STAFF YES   4. Advise patient to be prepared - "Two hours prior to your appointment, go ahead and check your blood pressure, pulse, oxygen saturation, and your weight (if you have the equipment to check those) and write them all down. When your visit starts, your provider will ask you for this information. If you have an Apple Watch or Kardia device, please plan to have heart rate information ready on the day of your appointment. Please have a pen and paper handy nearby the day of the visit as well."  5. Give patient instructions for MyChart download to smartphone OR Doximity/Doxy.me as below if video visit (depending on what platform provider is using)  6. Inform patient they will receive a phone call 15 minutes prior to their appointment time (may be from unknown caller ID) so they should be prepared to answer    TELEPHONE CALL NOTE  FELIBERTO STOCKLEY has been deemed a candidate for a follow-up tele-health visit to limit community exposure during the Covid-19 pandemic. I spoke with the patient via phone to ensure availability of phone/video source, confirm preferred email & phone number, and discuss instructions and expectations.  I reminded LASHON HILLIER to be prepared with any vital sign and/or heart rhythm information that could potentially be  obtained via home monitoring, at the time of his visit. I reminded YUSSUF SAWYERS to expect a phone call prior to his visit.  Ashland P Edwards 12/16/2018 10:12 AM   INSTRUCTIONS FOR DOWNLOADING THE MYCHART APP TO SMARTPHONE  - The patient must first make sure to have activated MyChart and know their login information - If Apple, go to [employer] and type in MyChart in the search bar and download the app. If Android, ask patient to go to [employer] and type in [place] in the search bar and download the app. The app is free but as with any other app downloads, their  phone may require them to verify saved payment information or Apple/Android password.  - The patient will need to then log into the app with their MyChart username and password, and select [facility] as their healthcare provider to link the account. When it is time for your visit, go to the MyChart app, find appointments, and click Begin Video Visit. Be sure to Select Allow for your device to access the Microphone and Camera for your visit. You will then be connected, and your provider will be with you shortly.  **If they have any issues connecting, or need assistance please contact MyChart service desk [phone])**  **If using a computer, in order to ensure the best quality for their visit they will need to use either of the following Internet Browsers: [employer], or Google Chrome**  IF USING DOXIMITY or DOXY.ME - The patient will receive a link just prior to their visit by text.     FULL LENGTH CONSENT FOR TELE-HEALTH VISIT   I hereby voluntarily request, consent and authorize [facility] and its employed or contracted physicians, physician assistants, nurse practitioners or other licensed health care professionals (the Practitioner), to provide me with telemedicine health care services (the Services") as deemed necessary by the treating Practitioner. I acknowledge and consent to receive the Services by the Practitioner via telemedicine. I understand that the telemedicine visit will involve communicating with the Practitioner through live audiovisual communication technology and the disclosure of certain medical information by electronic transmission. I acknowledge that I have been given the opportunity to request an in-person assessment or other available alternative prior to the telemedicine visit and am voluntarily participating in the telemedicine visit.  I understand that I have the right to withhold or withdraw my consent to the use of telemedicine in the course of  my care at any time, without affecting my right to future care or treatment, and that the Practitioner or I may terminate the telemedicine visit at any time. I understand that I have the right to inspect all information obtained and/or recorded in the course of the telemedicine visit and may receive copies of available information for a reasonable fee.  I understand that some of the potential risks of receiving the Services via telemedicine include:   Delay or interruption in medical evaluation due to technological equipment failure or disruption;  Information transmitted may not be sufficient (e.g. poor resolution of images) to allow for appropriate medical decision making by the Practitioner; and/or   In rare instances, security protocols could fail, causing a breach of personal health information.  Furthermore, I acknowledge that it is my responsibility to provide information about my medical history, conditions and care that is complete and accurate to the best of my ability. I acknowledge that Practitioner's advice, recommendations, and/or decision may be based on factors not within their control, such  as incomplete or inaccurate data provided by me or distortions of diagnostic images or specimens that may result from electronic transmissions. I understand that the practice of medicine is not an exact science and that Practitioner makes no warranties or guarantees regarding treatment outcomes. I acknowledge that I will receive a copy of this consent concurrently upon execution via email to the email address I last provided but may also request a printed copy by calling the office of [facility].    I understand that my insurance will be billed for this visit.   I have read or had this consent read to me.  I understand the contents of this consent, which adequately explains the benefits and risks of the Services being provided via telemedicine.   I have been provided ample opportunity to ask  questions regarding this consent and the Services and have had my questions answered to my satisfaction.  I give my informed consent for the services to be provided through the use of telemedicine in my medical care  By participating in this telemedicine visit I agree to the above.

## 2018-12-21 ENCOUNTER — Other Ambulatory Visit: Payer: Self-pay

## 2018-12-21 ENCOUNTER — Telehealth (INDEPENDENT_AMBULATORY_CARE_PROVIDER_SITE_OTHER): Payer: Medicare Other | Admitting: Internal Medicine

## 2018-12-21 DIAGNOSIS — R55 Syncope and collapse: Secondary | ICD-10-CM

## 2018-12-21 DIAGNOSIS — I480 Paroxysmal atrial fibrillation: Secondary | ICD-10-CM

## 2018-12-21 NOTE — Progress Notes (Signed)
Electrophysiology TeleHealth Note   Due to national recommendations of social distancing due to COVID 19, an audio/video telehealth visit is felt to be most appropriate for this patient at this time.  See MyChart message from today for the patient's consent to telehealth for [facility].   Date:  12/21/2018   ID:  Garrett Vargas, DOB [birth year], MRN [id number]  Location: patient's home  Provider location: [address] [place]  Evaluation Performed: Follow-up visit  PCP:  Caren Macadam, MD  Cardiologist:  No primary care provider on file. Electrophysiologist:  Dr Bonney Roussel.  Chief Complaint:  "I've been doing ok."  History of Present Illness:    Garrett Vargas is a 73 y.o. male who presents via audio/video conferencing for a telehealth visit today. He is a pleasant 73 yo man with a h/o  Since last being seen in our clinic, the patient reports doing very well.  Today, he denies symptoms of palpitations, chest pain, shortness of breath,  lower extremity edema, dizziness, presyncope, or syncope.  The patient is otherwise without complaint today.  The patient denies symptoms of fevers, chills, cough, or new SOB worrisome for COVID 19.  Past Medical History:  Diagnosis Date  . A-fib ([place])   . ALLERGIC RHINITIS   . Arthritis   . Colon polyp   . Dysrhythmia   . Erectile dysfunction   . History of bronchitis as a child   . Hyperlipemia   . Hypertension   . S/P lateral meniscectomy of left knee     Past Surgical History:  Procedure Laterality Date  . CARDIOVERSION N/A 03/17/2014   Procedure: CARDIOVERSION;  Surgeon: Sanda Klein, MD;  [facility];  Service: Cardiovascular;  Laterality: N/A;  . colonscopy     . ELBOW ARTHROSCOPY Bilateral   . LOOP RECORDER INSERTION N/A 04/12/2018   Procedure: LOOP RECORDER INSERTION;  Surgeon: Evans Lance, MD;  Location: [facility];  Service: Cardiovascular;  Laterality: N/A;  . Multiple  Knee,elbow and foot surgeries    . neuroma left foot,2010, Dr. Anne Fu    . TEE WITHOUT CARDIOVERSION N/A 03/17/2014   Procedure: TRANSESOPHAGEAL ECHOCARDIOGRAM (TEE);  Surgeon: Sanda Klein, MD;  Location: [facility];  Service: Cardiovascular;  Laterality: N/A;  . TOTAL HIP ARTHROPLASTY Left 07/18/2015   Procedure: LEFT TOTAL HIP ARTHROPLASTY ANTERIOR APPROACH;  Surgeon: Gaynelle Arabian, MD;  Location[facility];  Service: Orthopedics;  Laterality: Left;    Current Outpatient Medications  Medication Sig Dispense Refill  . atenolol (TENORMIN) 25 MG tablet TAKE ONE HALF TABLET BY  MOUTH DAILY. MAY TAKE AN  EXTRA ONE HALF TABLET DAILY FOR PALPITATIONS OR HEART  RACING. 90 tablet 2  . atorvastatin (LIPITOR) 20 MG tablet Take 1 tablet (20 mg total) by mouth daily. 90 tablet 3  . flecainide (TAMBOCOR) 50 MG tablet Take 1 tablet (50 mg total) by mouth 2 (two) times daily. 180 tablet 3  . Multiple Vitamins-Minerals (CENTRUM SILVER PO) Take 1 capsule by mouth daily.    . niacin 500 MG tablet Take 500 mg by mouth at bedtime.    . Omega-3 Fatty Acids (FISH OIL PO) Take 1 capsule by mouth daily.     Marland Kitchen omeprazole (PRILOSEC) 20 MG capsule Take 20 mg by mouth daily.    . tadalafil (CIALIS) 20 MG tablet 1 tablet 3-4 hours prior to sex 30 tablet 11  . XARELTO 20 MG TABS tablet TAKE 1 TABLET BY MOUTH  DAILY WITH SUPPER (Patient taking differently: Take 20 mg by mouth daily with supper. ) 90 tablet 1   No current facility-administered medications for this visit.     Allergies:   Patient has no known allergies.   Social History:  The patient  reports that he quit smoking about 31 years ago. His smoking use included cigarettes. He has a 5.00 pack-year smoking history. He has never used smokeless tobacco. He reports current alcohol use. He reports that he does not use drugs.   Family History:  The patient's  family history includes Coronary artery disease in an other family member; Heart disease in his  mother; Kidney disease in his sister; Prostate cancer in an other family member; Prostate cancer (age of onset: 66) in his father; Stroke in an other family member; Stroke (age of onset: 62) in his mother.   ROS:  Please see the history of present illness.   All other systems are personally reviewed and negative.    Exam:    Vital Signs:  There were no vitals taken for this visit.  Well appearing, alert and conversant, regular work of breathing,  good skin color Eyes- anicteric, neuro- grossly intact, skin- no apparent rash or lesions or cyanosis, mouth- oral mucosa is pink   Labs/Other Tests and Data Reviewed:    Recent Labs: 07/15/2018: ALT 24; TSH 2.47 10/11/2018: BUN 14; Creatinine, Ser 0.87; Hemoglobin 16.3; Platelets 176; Potassium 4.2; Sodium 140   Wt Readings from Last 3 Encounters:  10/14/18 184 lb 3.2 oz (83.6 kg)  07/14/18 184 lb 14.4 oz (83.9 kg)  07/12/18 184 lb (83.5 kg)     Other studies personally reviewed:  Last device remote is reviewed from [facility] PDF dated 11/29/18 which reveals normal device function, no arrhythmias    ASSESSMENT & PLAN:    1.  PAF - he has improved on low dose flecainide along with atenolol.  2. Syncope - he has not had any episodes. 3. HTN - his blood pressure is well controlled on the atenolol.  4. COVID 19 screen The patient denies symptoms of COVID 19 at this time.  The importance of social distancing was discussed today.  Follow-up:  3 months Next remote: 6/20  Current medicines are reviewed at length with the patient today.   The patient does not have concerns regarding his medicines.  The following changes were made today:  none  Labs/ tests ordered today include:  No orders of the defined types were placed in this encounter.    Patient Risk:  after full review of this patients clinical status, I feel that they are at moderate risk at this time.  Today, I have spent 25 minutes with the patient with telehealth technology  discussing all of the above.    Signed, [name], MD  12/21/2018 1:56 PM     [facility] [address] [place] [address] [phone] (office) [phone] (fax)

## 2018-12-28 DIAGNOSIS — L570 Actinic keratosis: Secondary | ICD-10-CM | POA: Diagnosis not present

## 2018-12-28 DIAGNOSIS — D1801 Hemangioma of skin and subcutaneous tissue: Secondary | ICD-10-CM | POA: Diagnosis not present

## 2018-12-28 DIAGNOSIS — L814 Other melanin hyperpigmentation: Secondary | ICD-10-CM | POA: Diagnosis not present

## 2018-12-28 DIAGNOSIS — D229 Melanocytic nevi, unspecified: Secondary | ICD-10-CM | POA: Diagnosis not present

## 2018-12-28 DIAGNOSIS — L821 Other seborrheic keratosis: Secondary | ICD-10-CM | POA: Diagnosis not present

## 2018-12-30 ENCOUNTER — Other Ambulatory Visit: Payer: Self-pay | Admitting: Internal Medicine

## 2018-12-30 MED ORDER — FLECAINIDE ACETATE 50 MG PO TABS: 50.0000 mg | ORAL_TABLET | Freq: Two times a day (BID) | ORAL | 3 refills | Status: DC

## 2018-12-30 NOTE — Telephone Encounter (Signed)
°*  STAT* If patient is at the pharmacy, call can be transferred to refill team.   1. Which medications need to be refilled? (please list name of each medication and dose if known)   flecainide (TAMBOCOR) 50 MG tablet  2. Which pharmacy/location (including street and city if local pharmacy) is medication to be sent to?  [place], [place]  3. Do they need a 30 day or 90 day supply? 46  Pt recently had this rx filled in person, but would like to start getting this via mail order with all of his other medications

## 2018-12-30 NOTE — Telephone Encounter (Signed)
Pt's medication was sent to pt's pharmacy as requested. Confirmation received.  °

## 2019-01-03 ENCOUNTER — Ambulatory Visit (INDEPENDENT_AMBULATORY_CARE_PROVIDER_SITE_OTHER): Payer: Medicare Other | Admitting: *Deleted

## 2019-01-03 DIAGNOSIS — R55 Syncope and collapse: Secondary | ICD-10-CM

## 2019-01-03 LAB — CUP PACEART REMOTE DEVICE CHECK
Date Time Interrogation Session: 20200530194128
Implantable Pulse Generator Implant Date: 20190909

## 2019-01-10 NOTE — Progress Notes (Signed)
[facility] Summary Report / Loop Recorder 

## 2019-01-17 ENCOUNTER — Ambulatory Visit (INDEPENDENT_AMBULATORY_CARE_PROVIDER_SITE_OTHER): Payer: Medicare Other | Admitting: Family Medicine

## 2019-01-17 ENCOUNTER — Other Ambulatory Visit: Payer: Self-pay

## 2019-01-17 ENCOUNTER — Encounter: Payer: Self-pay | Admitting: Family Medicine

## 2019-01-17 VITALS — BP 100/70 | HR 59 | Temp 98.1°F | Ht 74.0 in | Wt 184.0 lb

## 2019-01-17 DIAGNOSIS — I10 Essential (primary) hypertension: Secondary | ICD-10-CM

## 2019-01-17 DIAGNOSIS — E7800 Pure hypercholesterolemia, unspecified: Secondary | ICD-10-CM | POA: Diagnosis not present

## 2019-01-17 DIAGNOSIS — Z125 Encounter for screening for malignant neoplasm of prostate: Secondary | ICD-10-CM | POA: Diagnosis not present

## 2019-01-17 DIAGNOSIS — R7309 Other abnormal glucose: Secondary | ICD-10-CM | POA: Diagnosis not present

## 2019-01-17 LAB — LIPID PANEL
Cholesterol: 166 mg/dL (ref 0–200)
HDL: 46.6 mg/dL (ref >39.00)
LDL Cholesterol: 101 mg/dL — ABNORMAL HIGH (ref 0–99)
NonHDL: 119.2
Total CHOL/HDL Ratio: 4
Triglycerides: 92 mg/dL (ref 0.0–149.0)
VLDL: 18.4 mg/dL (ref 0.0–40.0)

## 2019-01-17 LAB — COMPREHENSIVE METABOLIC PANEL
ALT: 23 U/L (ref 0–53)
AST: 22 U/L (ref 0–37)
Albumin: 4.2 g/dL (ref 3.5–5.2)
Alkaline Phosphatase: 50 U/L (ref 39–117)
BUN: 14 mg/dL (ref 6–23)
CO2: 31 mEq/L (ref 19–32)
Calcium: 9.3 mg/dL (ref 8.4–10.5)
Chloride: 102 mEq/L (ref 96–112)
Creatinine, Ser: 0.84 mg/dL (ref 0.40–1.50)
GFR: 89.47 mL/min (ref >60.00)
Glucose, Bld: 82 mg/dL (ref 70–99)
Potassium: 4.1 mEq/L (ref 3.5–5.1)
Sodium: 139 mEq/L (ref 135–145)
Total Bilirubin: 1.2 mg/dL (ref 0.2–1.2)
Total Protein: 6.4 g/dL (ref 6.0–8.3)

## 2019-01-17 LAB — CBC WITH DIFFERENTIAL/PLATELET
Basophils Absolute: 0 10*3/uL (ref 0.0–0.1)
Basophils Relative: 0.4 % (ref 0.0–3.0)
Eosinophils Absolute: 0.2 10*3/uL (ref 0.0–0.7)
Eosinophils Relative: 3.1 % (ref 0.0–5.0)
HCT: 48.6 % (ref 39.0–52.0)
Hemoglobin: 16 g/dL (ref 13.0–17.0)
Lymphocytes Relative: 33.1 % (ref 12.0–46.0)
Lymphs Abs: 2.3 10*3/uL (ref 0.7–4.0)
MCHC: 33 g/dL (ref 30.0–36.0)
MCV: 91.2 fl (ref 78.0–100.0)
Monocytes Absolute: 0.5 10*3/uL (ref 0.1–1.0)
Monocytes Relative: 7.1 % (ref 3.0–12.0)
Neutro Abs: 3.9 10*3/uL (ref 1.4–7.7)
Neutrophils Relative %: 56.3 % (ref 43.0–77.0)
Platelets: 224 10*3/uL (ref 150.0–400.0)
RBC: 5.33 Mil/uL (ref 4.22–5.81)
RDW: 13.9 % (ref 11.5–15.5)
WBC: 7 10*3/uL (ref 4.0–10.5)

## 2019-01-17 LAB — PSA: PSA: 0.99 ng/mL (ref 0.10–4.00)

## 2019-01-17 LAB — HEMOGLOBIN A1C: Hgb A1c MFr Bld: 5.5 % (ref 4.6–6.5)

## 2019-01-17 NOTE — Progress Notes (Signed)
Garrett Vargas DOB: June 04, 1946 Encounter date: 01/17/2019  This is a 73 y.o. male who presents for complete physical   History of present illness/Additional concerns: Near syncope episode in march; followed up with cardiology (Dr. Lovena Le). Flecainide started. Feels well overall. Has loop recorder, states having less % of a fib.   Plays golf, does yardwork; seems to get ticks all the time. Careful about checking self and removing when they appear. Has treated area with alcohol when removed.   Did restart taking niacin 500mg  extended release since last visit.   States that HR at home has been more in 70-80's (today's reading was low for him today). No issues with feeling light headed/dizzy with position changes. Usually in low 564'P systolic.   No allergy sx this year.  Follows with Dr. Pearline Cables for derm.  Follows with Dr. Quay Burow for eyes.     Past Medical History:  Diagnosis Date  . A-fib ([place])   . ALLERGIC RHINITIS   . Arthritis   . Colon polyp   . Dysrhythmia   . Erectile dysfunction   . History of bronchitis as a child   . Hyperlipemia   . Hypertension   . S/P lateral meniscectomy of left knee    Past Surgical History:  Procedure Laterality Date  . CARDIOVERSION N/A 03/17/2014   Procedure: CARDIOVERSION;  Surgeon: Sanda Klein, MD;  Location[facility];  Service: Cardiovascular;  Laterality: N/A;  . colonscopy     . ELBOW ARTHROSCOPY Bilateral   . LOOP RECORDER INSERTION N/A 04/12/2018   Procedure: LOOP RECORDER INSERTION;  Surgeon: Evans Lance, MD;  Location: [facility];  Service: Cardiovascular;  Laterality: N/A;  . Multiple Knee,elbow and foot surgeries    . neuroma left foot,2010, Dr. Anne Fu    . TEE WITHOUT CARDIOVERSION N/A 03/17/2014   Procedure: TRANSESOPHAGEAL ECHOCARDIOGRAM (TEE);  Surgeon: Sanda Klein, MD;  Location: [facility];  Service: Cardiovascular;  Laterality: N/A;  . TOTAL HIP ARTHROPLASTY Left 07/18/2015   Procedure: LEFT  TOTAL HIP ARTHROPLASTY ANTERIOR APPROACH;  Surgeon: Gaynelle Arabian, MD;  Location: [facility];  Service: Orthopedics;  Laterality: Left;   No Known Allergies Current Meds  Medication Sig  . atenolol (TENORMIN) 25 MG tablet TAKE ONE HALF TABLET BY  MOUTH DAILY. MAY TAKE AN  EXTRA ONE HALF TABLET DAILY FOR PALPITATIONS OR HEART  RACING. (Patient taking differently: TAKE ONE HALF TABLET BY  MOUTH DAILY.)  . atorvastatin (LIPITOR) 20 MG tablet Take 1 tablet (20 mg total) by mouth daily.  . flecainide (TAMBOCOR) 50 MG tablet Take 1 tablet (50 mg total) by mouth 2 (two) times daily.  . Multiple Vitamins-Minerals (CENTRUM SILVER PO) Take 1 capsule by mouth daily.  . niacin 500 MG tablet Take 500 mg by mouth at bedtime.  . Omega-3 Fatty Acids (FISH OIL PO) Take 1 capsule by mouth daily.   Marland Kitchen omeprazole (PRILOSEC) 20 MG capsule Take 20 mg by mouth daily.  . tadalafil (CIALIS) 5 MG tablet Take 5 mg by mouth daily as needed for erectile dysfunction.  Alveda Reasons 20 MG TABS tablet TAKE 1 TABLET BY MOUTH  DAILY WITH SUPPER (Patient taking differently: Take 20 mg by mouth daily with supper. )   Social History   Tobacco Use  . Smoking status: Former Smoker    Packs/day: 0.50    Years: 10.00    Pack years: 5.00    Types: Cigarettes    Quit date: 08/05/1987    Years since quitting: 31.4  .  Smokeless tobacco: Never Used  Substance Use Topics  . Alcohol use: Yes    Alcohol/week: 0.0 standard drinks    Comment: occasional   Family History  Problem Relation Age of Onset  . Coronary artery disease Other   . Prostate cancer Other   . Stroke Other   . Heart disease Mother   . Stroke Mother 49  . Prostate cancer Father 70  . Kidney disease Sister      Review of Systems  Constitutional: Negative for activity change, appetite change, chills, fatigue, fever and unexpected weight change.  HENT: Negative for congestion, ear pain, hearing loss, sinus pressure, sinus pain, sore throat and trouble swallowing.    Eyes: Negative for pain and visual disturbance.  Respiratory: Negative for cough, chest tightness, shortness of breath and wheezing.   Cardiovascular: Negative for chest pain, palpitations and leg swelling.  Gastrointestinal: Negative for abdominal distention, abdominal pain, blood in stool, constipation, diarrhea, nausea and vomiting.  Genitourinary: Negative for decreased urine volume, difficulty urinating, dysuria, penile pain and testicular pain.  Musculoskeletal: Negative for arthralgias, back pain and joint swelling.  Skin: Negative for rash.  Neurological: Negative for dizziness, weakness, numbness and headaches.  Hematological: Negative for adenopathy. Does not bruise/bleed easily.  Psychiatric/Behavioral: Negative for agitation, sleep disturbance and suicidal ideas. The patient is not nervous/anxious.     CBC:  Lab Results  Component Value Date   WBC 5.7 10/11/2018   HGB 16.3 10/11/2018   HCT 51.8 10/11/2018   MCH 29.4 10/11/2018   MCHC 31.5 10/11/2018   RDW 13.3 10/11/2018   PLT 176 10/11/2018   CMP: Lab Results  Component Value Date   NA 140 10/11/2018   K 4.2 10/11/2018   CL 106 10/11/2018   CO2 26 10/11/2018   ANIONGAP 8 10/11/2018   GLUCOSE 114 (H) 10/11/2018   BUN 14 10/11/2018   CREATININE 0.87 10/11/2018   GFRAA >60 10/11/2018   CALCIUM 8.8 (L) 10/11/2018   PROT 6.4 07/15/2018   BILITOT 1.2 07/15/2018   ALKPHOS 66 07/15/2018   ALT 24 07/15/2018   AST 23 07/15/2018   LIPID: Lab Results  Component Value Date   CHOL 167 07/15/2018   TRIG 98.0 07/15/2018   HDL 31.80 (L) 07/15/2018   LDLCALC 116 (H) 07/15/2018    Objective:  BP 100/70 (BP Location: Left Arm, Patient Position: Sitting, Cuff Size: Normal)   Pulse (!) 59   Temp 98.1 F (36.7 C) (Oral)   Ht 6\' 2"  (1.88 m)   Wt 184 lb (83.5 kg)   SpO2 96%   BMI 23.62 kg/m   Weight: 184 lb (83.5 kg)   BP Readings from Last 3 Encounters:  01/17/19 100/70  10/14/18 110/68  10/11/18 107/75   Wt  Readings from Last 3 Encounters:  01/17/19 184 lb (83.5 kg)  10/14/18 184 lb 3.2 oz (83.6 kg)  07/14/18 184 lb 14.4 oz (83.9 kg)    Physical Exam Constitutional:      General: He is not in acute distress.    Appearance: He is well-developed.  HENT:     Head: Normocephalic and atraumatic.     Right Ear: External ear normal.     Left Ear: External ear normal.     Nose: Nose normal.     Mouth/Throat:     Pharynx: No oropharyngeal exudate.  Eyes:     Conjunctiva/sclera: Conjunctivae normal.     Pupils: Pupils are equal, round, and reactive to light.  Neck:  Musculoskeletal: Neck supple.     Thyroid: No thyromegaly.  Cardiovascular:     Rate and Rhythm: Normal rate and regular rhythm.     Heart sounds: Normal heart sounds. No murmur. No friction rub. No gallop.   Pulmonary:     Effort: Pulmonary effort is normal. No respiratory distress.     Breath sounds: Normal breath sounds. No stridor. No wheezing or rales.  Abdominal:     General: Bowel sounds are normal.     Palpations: Abdomen is soft.  Musculoskeletal: Normal range of motion.  Skin:    General: Skin is warm and dry.  Neurological:     Mental Status: He is alert and oriented to person, place, and time.  Psychiatric:        Behavior: Behavior normal.        Thought Content: Thought content normal.        Judgment: Judgment normal.     Assessment/Plan: There are no preventive care reminders to display for this patient. Health Maintenance reviewed. He did get first shingrix but unable to get second due to [place].  1. Essential hypertension, benign bp is well controlled. Continue current medication. - CBC with Differential/Platelet; Future - Comprehensive metabolic panel; Future  2. Pure hypercholesterolemia Will recheck today; has restarted niacin since last visit. - Lipid panel; Future  3. Elevated glucose - Hemoglobin A1c; Future  4. Screening for prostate cancer Would like PSA and is aware of pros/cons  with bloodwork.  - PSA; Future   Return in about 6 months (around 07/19/2019) for Chronic condition visit.  Micheline Rough, MD

## 2019-01-17 NOTE — Addendum Note (Signed)
Addended by: Gwynne Edinger on: 01/17/2019 10:02 AM   Modules accepted: Orders

## 2019-01-18 ENCOUNTER — Encounter: Payer: Medicare Other | Admitting: Internal Medicine

## 2019-01-24 ENCOUNTER — Encounter: Payer: Self-pay | Admitting: Family Medicine

## 2019-01-26 DIAGNOSIS — H5203 Hypermetropia, bilateral: Secondary | ICD-10-CM | POA: Diagnosis not present

## 2019-02-03 ENCOUNTER — Ambulatory Visit (INDEPENDENT_AMBULATORY_CARE_PROVIDER_SITE_OTHER): Payer: Medicare Other | Admitting: *Deleted

## 2019-02-03 DIAGNOSIS — R55 Syncope and collapse: Secondary | ICD-10-CM | POA: Diagnosis not present

## 2019-02-04 LAB — CUP PACEART REMOTE DEVICE CHECK
Date Time Interrogation Session: 20200702200938
Implantable Pulse Generator Implant Date: 20190909

## 2019-02-10 NOTE — Progress Notes (Signed)
[facility] Summary Report / Loop Recorder 

## 2019-02-22 DIAGNOSIS — T7840XA Allergy, unspecified, initial encounter: Secondary | ICD-10-CM | POA: Diagnosis not present

## 2019-03-03 ENCOUNTER — Other Ambulatory Visit: Payer: Self-pay | Admitting: Internal Medicine

## 2019-03-03 NOTE — Telephone Encounter (Addendum)
Xarelto 20mg  refill request received; pt is 73 yrs old, wt-83.5kg, Crea-0.84 on 01/17/2019, last Telemedicine visit by Dr. Lovena Le on 12/21/2018, Diagnosis Afib, CrCl-92.74ml/min; will send in refill to requested pharmacy.

## 2019-03-08 ENCOUNTER — Ambulatory Visit (INDEPENDENT_AMBULATORY_CARE_PROVIDER_SITE_OTHER): Payer: Medicare Other | Admitting: *Deleted

## 2019-03-08 DIAGNOSIS — R55 Syncope and collapse: Secondary | ICD-10-CM | POA: Diagnosis not present

## 2019-03-09 LAB — CUP PACEART REMOTE DEVICE CHECK
Date Time Interrogation Session: 20200804201111
Implantable Pulse Generator Implant Date: 20190909

## 2019-03-15 NOTE — Progress Notes (Signed)
[facility] Summary Report / Loop Recorder 

## 2019-03-31 ENCOUNTER — Encounter: Payer: Medicare Other | Admitting: Internal Medicine

## 2019-04-12 ENCOUNTER — Ambulatory Visit (INDEPENDENT_AMBULATORY_CARE_PROVIDER_SITE_OTHER): Payer: Medicare Other | Admitting: *Deleted

## 2019-04-12 DIAGNOSIS — R55 Syncope and collapse: Secondary | ICD-10-CM

## 2019-04-12 LAB — CUP PACEART REMOTE DEVICE CHECK
Date Time Interrogation Session: 20200906204000
Implantable Pulse Generator Implant Date: 20190909

## 2019-04-27 NOTE — Progress Notes (Signed)
[facility] Summary Report / Loop Recorder 

## 2019-05-13 ENCOUNTER — Ambulatory Visit (INDEPENDENT_AMBULATORY_CARE_PROVIDER_SITE_OTHER): Payer: Medicare Other | Admitting: *Deleted

## 2019-05-13 DIAGNOSIS — R55 Syncope and collapse: Secondary | ICD-10-CM

## 2019-05-14 LAB — CUP PACEART REMOTE DEVICE CHECK
Date Time Interrogation Session: 20201009203753
Implantable Pulse Generator Implant Date: 20190909

## 2019-05-23 NOTE — Progress Notes (Signed)
[facility] Summary Report / Loop Recorder 

## 2019-06-06 DIAGNOSIS — D229 Melanocytic nevi, unspecified: Secondary | ICD-10-CM | POA: Diagnosis not present

## 2019-06-06 DIAGNOSIS — D1801 Hemangioma of skin and subcutaneous tissue: Secondary | ICD-10-CM | POA: Diagnosis not present

## 2019-06-06 DIAGNOSIS — L814 Other melanin hyperpigmentation: Secondary | ICD-10-CM | POA: Diagnosis not present

## 2019-06-06 DIAGNOSIS — L819 Disorder of pigmentation, unspecified: Secondary | ICD-10-CM | POA: Diagnosis not present

## 2019-06-06 DIAGNOSIS — C4441 Basal cell carcinoma of skin of scalp and neck: Secondary | ICD-10-CM | POA: Diagnosis not present

## 2019-06-06 DIAGNOSIS — D485 Neoplasm of uncertain behavior of skin: Secondary | ICD-10-CM | POA: Diagnosis not present

## 2019-06-06 DIAGNOSIS — L821 Other seborrheic keratosis: Secondary | ICD-10-CM | POA: Diagnosis not present

## 2019-06-06 DIAGNOSIS — L570 Actinic keratosis: Secondary | ICD-10-CM | POA: Diagnosis not present

## 2019-06-07 ENCOUNTER — Encounter: Payer: Self-pay | Admitting: Family Medicine

## 2019-06-07 DIAGNOSIS — C4441 Basal cell carcinoma of skin of scalp and neck: Secondary | ICD-10-CM | POA: Diagnosis not present

## 2019-06-07 DIAGNOSIS — C44622 Squamous cell carcinoma of skin of right upper limb, including shoulder: Secondary | ICD-10-CM | POA: Diagnosis not present

## 2019-06-15 ENCOUNTER — Ambulatory Visit (INDEPENDENT_AMBULATORY_CARE_PROVIDER_SITE_OTHER): Payer: Medicare Other | Admitting: *Deleted

## 2019-06-15 DIAGNOSIS — R55 Syncope and collapse: Secondary | ICD-10-CM

## 2019-06-15 DIAGNOSIS — I480 Paroxysmal atrial fibrillation: Secondary | ICD-10-CM

## 2019-06-15 LAB — CUP PACEART REMOTE DEVICE CHECK
Date Time Interrogation Session: 20201111165654
Implantable Pulse Generator Implant Date: 20190909

## 2019-06-22 ENCOUNTER — Ambulatory Visit: Payer: Medicare Other | Admitting: Internal Medicine

## 2019-06-22 ENCOUNTER — Encounter: Payer: Self-pay | Admitting: Internal Medicine

## 2019-06-22 ENCOUNTER — Other Ambulatory Visit: Payer: Self-pay

## 2019-06-22 VITALS — BP 104/68 | HR 73 | Ht 74.0 in | Wt 189.0 lb

## 2019-06-22 DIAGNOSIS — R55 Syncope and collapse: Secondary | ICD-10-CM

## 2019-06-22 NOTE — Progress Notes (Signed)
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 He is a pleasant 73 yo man with a h/o PAF, tachy induced CM that resolved, remote tobacco abuse snad syncope, s/p ILR insertion. Several months ago his atrial fib/flutter was increasing in frequency and he was started on low dose flecainide. He has done well in the interim. He has not had palpitations or syncope. No chest pain or sob. No edema. No Known Allergies   Current Outpatient Medications  Medication Sig Dispense Refill  . atenolol (TENORMIN) 25 MG tablet TAKE ONE HALF TABLET BY  MOUTH DAILY. MAY TAKE AN  EXTRA ONE HALF TABLET DAILY FOR PALPITATIONS OR HEART  RACING. 90 tablet 2  . atorvastatin (LIPITOR) 20 MG tablet Take 1 tablet (20 mg total) by mouth daily. 90 tablet 3  . flecainide (TAMBOCOR) 50 MG tablet Take 1 tablet (50 mg total) by mouth 2 (two) times daily. 180 tablet 3  . Multiple Vitamins-Minerals (CENTRUM SILVER PO) Take 1 capsule by mouth daily.    . niacin 500 MG tablet Take 500 mg by mouth at bedtime.    . Omega-3 Fatty Acids (FISH OIL PO) Take 1 capsule by mouth daily.     Marland Kitchen omeprazole (PRILOSEC) 20 MG capsule Take 20 mg by mouth daily.    . tadalafil (CIALIS) 5 MG tablet Take 5 mg by mouth daily as needed for erectile dysfunction.    Alveda Reasons 20 MG TABS tablet TAKE 1 TABLET BY MOUTH  DAILY WITH SUPPER 90 tablet 2   No current facility-administered medications for this visit.      Past Medical History:  Diagnosis Date  . A-fib ([place])   . ALLERGIC RHINITIS   . Arthritis   . Colon polyp   . Dysrhythmia   . Erectile dysfunction   . History of bronchitis as a child   . Hyperlipemia   . Hypertension   . S/P lateral meniscectomy of left knee     ROS:   All systems reviewed and negative except as noted in the HPI.   Past Surgical History:  Procedure Laterality Date  . CARDIOVERSION N/A 03/17/2014   Procedure: CARDIOVERSION;  Surgeon: Sanda Klein, MD;  [facility];  Service: Cardiovascular;   Laterality: N/A;  . colonscopy     . ELBOW ARTHROSCOPY Bilateral   . LOOP RECORDER INSERTION N/A 04/12/2018   Procedure: LOOP RECORDER INSERTION;  Surgeon: Evans Lance, MD;  Location: [facility];  Service: Cardiovascular;  Laterality: N/A;  . Multiple Knee,elbow and foot surgeries    . neuroma left foot,2010, Dr. Anne Fu    . TEE WITHOUT CARDIOVERSION N/A 03/17/2014   Procedure: TRANSESOPHAGEAL ECHOCARDIOGRAM (TEE);  Surgeon: Sanda Klein, MD;  Location: [facility];  Service: Cardiovascular;  Laterality: N/A;  . TOTAL HIP ARTHROPLASTY Left 07/18/2015   Procedure: LEFT TOTAL HIP ARTHROPLASTY ANTERIOR APPROACH;  Surgeon: Gaynelle Arabian, MD;  Location: [facility];  Service: Orthopedics;  Laterality: Left;     Family History  Problem Relation Age of Onset  . Coronary artery disease Other   . Prostate cancer Other   . Stroke Other   . Heart disease Mother   . Stroke Mother 23  . Prostate cancer Father 27  . Kidney disease Sister      Social History   Socioeconomic History  . Marital status: Married    Spouse name: Not on file  . Number of children: Not on file  . Years of education: Not on file  .  Highest education level: Not on file  Occupational History  . Occupation: retired Chief Financial Officer  . Occupation: former smoker  . Occupation: alocohol use-yes  . Occupation: Drug use-no  . Occupation: regular exercise-yes  Social Needs  . Financial resource strain: Not on file  . Food insecurity    Worry: Not on file    Inability: Not on file  . Transportation needs    Medical: Not on file    Non-medical: Not on file  Tobacco Use  . Smoking status: Former Smoker    Packs/day: 0.50    Years: 10.00    Pack years: 5.00    Types: Cigarettes    Quit date: 08/05/1987    Years since quitting: 31.9  . Smokeless tobacco: Never Used  Substance and Sexual Activity  . Alcohol use: Yes    Alcohol/week: 0.0 standard drinks    Comment: occasional  . Drug use: No  .  Sexual activity: Not on file  Lifestyle  . Physical activity    Days per week: Not on file    Minutes per session: Not on file  . Stress: Not on file  Relationships  . Social Herbalist on phone: Not on file    Gets together: Not on file    Attends religious service: Not on file    Active member of club or organization: Not on file    Attends meetings of clubs or organizations: Not on file    Relationship status: Not on file  . Intimate partner violence    Fear of current or ex partner: Not on file    Emotionally abused: Not on file    Physically abused: Not on file    Forced sexual activity: Not on file  Other Topics Concern  . Not on file  Social History Narrative  . Not on file     BP 104/68   Pulse 73   Ht 6\' 2"  (1.88 m)   Wt 189 lb (85.7 kg)   SpO2 95%   BMI 24.27 kg/m   Physical Exam:  Well appearing NAD HEENT: Unremarkable Neck:  No JVD, no thyromegally Lymphatics:  No adenopathy Back:  No CVA tenderness Lungs:  Clear HEART:  Regular rate rhythm, no murmurs, no rubs, no clicks Abd:  soft, positive bowel sounds, no organomegally, no rebound, no guarding Ext:  2 plus pulses, no edema, no cyanosis, no clubbing Skin:  No rashes no nodules Neuro:  CN II through XII intact, motor grossly intact  EKG - nsr with RBBB  DEVICE  Normal device function.  See PaceArt for details. No atrial fib.  Assess/Plan: 1. PAF - he is maintaining NSR. He will continue low dose flecainide. No QRS prolongation.  2. Syncope - he has had no recurrent episodes and his ILR demonstrates no prolonged pauses. 3. HTN - his bp is well controlled. We will follow. Continue atenolol.  Mikle Bosworth.D.

## 2019-06-22 NOTE — Patient Instructions (Signed)

## 2019-07-06 NOTE — Progress Notes (Signed)
[facility] Summary Report / Loop Recorder 

## 2019-07-17 ENCOUNTER — Encounter: Payer: Self-pay | Admitting: Family Medicine

## 2019-07-17 DIAGNOSIS — C4441 Basal cell carcinoma of skin of scalp and neck: Secondary | ICD-10-CM | POA: Insufficient documentation

## 2019-07-17 DIAGNOSIS — D0460 Carcinoma in situ of skin of unspecified upper limb, including shoulder: Secondary | ICD-10-CM | POA: Insufficient documentation

## 2019-07-18 ENCOUNTER — Ambulatory Visit (INDEPENDENT_AMBULATORY_CARE_PROVIDER_SITE_OTHER): Payer: Medicare Other | Admitting: *Deleted

## 2019-07-18 DIAGNOSIS — R55 Syncope and collapse: Secondary | ICD-10-CM | POA: Diagnosis not present

## 2019-07-19 LAB — CUP PACEART REMOTE DEVICE CHECK
Date Time Interrogation Session: 20201214212220
Implantable Pulse Generator Implant Date: 20190909

## 2019-08-11 LAB — CUP PACEART INCLINIC DEVICE CHECK
Date Time Interrogation Session: 20201118080509
Implantable Pulse Generator Implant Date: 20190909

## 2019-08-18 ENCOUNTER — Ambulatory Visit: Payer: Medicare Other

## 2019-08-21 LAB — CUP PACEART REMOTE DEVICE CHECK
Date Time Interrogation Session: 20210117000433
Implantable Pulse Generator Implant Date: 20190909

## 2019-08-22 ENCOUNTER — Ambulatory Visit (INDEPENDENT_AMBULATORY_CARE_PROVIDER_SITE_OTHER): Payer: Medicare Other | Admitting: *Deleted

## 2019-08-22 DIAGNOSIS — R55 Syncope and collapse: Secondary | ICD-10-CM | POA: Diagnosis not present

## 2019-08-27 ENCOUNTER — Other Ambulatory Visit: Payer: Self-pay | Admitting: Family Medicine

## 2019-08-27 ENCOUNTER — Other Ambulatory Visit: Payer: Self-pay | Admitting: Internal Medicine

## 2019-08-27 DIAGNOSIS — I10 Essential (primary) hypertension: Secondary | ICD-10-CM

## 2019-08-27 DIAGNOSIS — E7800 Pure hypercholesterolemia, unspecified: Secondary | ICD-10-CM

## 2019-09-05 ENCOUNTER — Ambulatory Visit (INDEPENDENT_AMBULATORY_CARE_PROVIDER_SITE_OTHER): Payer: Medicare Other

## 2019-09-05 VITALS — BP 130/75 | Ht 74.0 in | Wt 184.0 lb

## 2019-09-05 DIAGNOSIS — Z0000 Encounter for general adult medical examination without abnormal findings: Secondary | ICD-10-CM | POA: Diagnosis not present

## 2019-09-05 NOTE — Patient Instructions (Addendum)
Garrett Vargas , Thank you for taking time to participate in your Medicare Wellness Visit. I appreciate your ongoing commitment to your health goals. Please review the following plan we discussed and let me know if I can assist you in the future.   Screening recommendations/referrals: Colorectal Screening: colonoscopy performed 05/21/2017. Due again 05/22/2020.  Vision and Dental Exams: Recommended annual ophthalmology exams for early detection of glaucoma and other disorders of the eye. He has annual eye exams. Recommended annual dental exams for proper oral hygiene. He sees a Pharmacist, community regularly as well.  Diabetic Exams: Diabetic Eye Exam: N/A Diabetic Foot Exam: N/A  Vaccinations: Influenza vaccine: completed in October 2020 per patient report. Pneumococcal vaccine: completed 12/10/2010 & 04/06/2014. Up to date.  Tdap vaccine: completed 05/09/2015; due again 05/08/2025. Shingles vaccine: completed 09/02/2018 and 02/23/2019.  Advanced directives: Please bring a copy of your POA (Power of Attorney) and/or Living Will to your next appointment.  Goals: Recommend to drink at least 6-8 8oz glasses of water per day.  Continue to exercise for at least 150 minutes per week.  Recommend to remove any items from the home that may cause slips or trips.  Next appointment: Please schedule your Annual Wellness Visit with your Nurse Health Advisor in one year.  Preventive Care 50 Years and Older, Male Preventive care refers to lifestyle choices and visits with your health care provider that can promote health and wellness. What does preventive care include?  A yearly physical exam. This is also called an annual well check.  Dental exams once or twice a year.  Routine eye exams. Ask your health care provider how often you should have your eyes checked.  Personal lifestyle choices, including:  Daily care of your teeth and gums.  Regular physical activity.  Eating a healthy diet.  Avoiding  tobacco and drug use.  Limiting alcohol use.  Practicing safe sex.  Taking low doses of aspirin every day if recommended by your health care provider..  Taking vitamin and mineral supplements as recommended by your health care provider. What happens during an annual well check? The services and screenings done by your health care provider during your annual well check will depend on your age, overall health, lifestyle risk factors, and family history of disease. Counseling  Your health care provider may ask you questions about your:  Alcohol use.  Tobacco use.  Drug use.  Emotional well-being.  Home and relationship well-being.  Sexual activity.  Eating habits.  History of falls.  Memory and ability to understand (cognition).  Work and work Statistician. Screening  You may have the following tests or measurements:  Height, weight, and BMI.  Blood pressure.  Lipid and cholesterol levels. These may be checked every 5 years, or more frequently if you are over 59 years old.  Skin check.  Lung cancer screening. You may have this screening every year starting at age 40 if you have a 30-pack-year history of smoking and currently smoke or have quit within the past 15 years.  Fecal occult blood test (FOBT) of the stool. You may have this test every year starting at age 26.  Flexible sigmoidoscopy or colonoscopy. You may have a sigmoidoscopy every 5 years or a colonoscopy every 10 years starting at age 19.  Prostate cancer screening. Recommendations will vary depending on your family history and other risks.  Hepatitis C blood test.  Hepatitis B blood test.  Sexually transmitted disease (STD) testing.  Diabetes screening. This is done by checking your  blood sugar (glucose) after you have not eaten for a while (fasting). You may have this done every 1-3 years.  Abdominal aortic aneurysm (AAA) screening. You may need this if you are a current or former  smoker.  Osteoporosis. You may be screened starting at age 38 if you are at high risk. Talk with your health care provider about your test results, treatment options, and if necessary, the need for more tests. Vaccines  Your health care provider may recommend certain vaccines, such as:  Influenza vaccine. This is recommended every year.  Tetanus, diphtheria, and acellular pertussis (Tdap, Td) vaccine. You may need a Td booster every 10 years.  Zoster vaccine. You may need this after age 76.  Pneumococcal 13-valent conjugate (PCV13) vaccine. One dose is recommended after age 69.  Pneumococcal polysaccharide (PPSV23) vaccine. One dose is recommended after age 58. Talk to your health care provider about which screenings and vaccines you need and how often you need them. This information is not intended to replace advice given to you by your health care provider. Make sure you discuss any questions you have with your health care provider. Document Released: 08/17/2015 Document Revised: 04/09/2016 Document Reviewed: 05/22/2015 Elsevier Interactive Patient Education  2017 [employer] Prevention in the Home Falls can cause injuries. They can happen to people of all ages. There are many things you can do to make your home safe and to help prevent falls. What can I do on the outside of my home?  Regularly fix the edges of walkways and driveways and fix any cracks.  Remove anything that might make you trip as you walk through a door, such as a raised step or threshold.  Trim any bushes or trees on the path to your home.  Use bright outdoor lighting.  Clear any walking paths of anything that might make someone trip, such as rocks or tools.  Regularly check to see if handrails are loose or broken. Make sure that both sides of any steps have handrails.  Any raised decks and porches should have guardrails on the edges.  Have any leaves, snow, or ice cleared regularly.  Use sand or  salt on walking paths during winter.  Clean up any spills in your garage right away. This includes oil or grease spills. What can I do in the bathroom?  Use night lights.  Install grab bars by the toilet and in the tub and shower. Do not use towel bars as grab bars.  Use non-skid mats or decals in the tub or shower.  If you need to sit down in the shower, use a plastic, non-slip stool.  Keep the floor dry. Clean up any water that spills on the floor as soon as it happens.  Remove soap buildup in the tub or shower regularly.  Attach bath mats securely with double-sided non-slip rug tape.  Do not have throw rugs and other things on the floor that can make you trip. What can I do in the bedroom?  Use night lights.  Make sure that you have a light by your bed that is easy to reach.  Do not use any sheets or blankets that are too big for your bed. They should not hang down onto the floor.  Have a firm chair that has side arms. You can use this for support while you get dressed.  Do not have throw rugs and other things on the floor that can make you trip. What can I do in  the kitchen?  Clean up any spills right away.  Avoid walking on wet floors.  Keep items that you use a lot in easy-to-reach places.  If you need to reach something above you, use a strong step stool that has a grab bar.  Keep electrical cords out of the way.  Do not use floor polish or wax that makes floors slippery. If you must use wax, use non-skid floor wax.  Do not have throw rugs and other things on the floor that can make you trip. What can I do with my stairs?  Do not leave any items on the stairs.  Make sure that there are handrails on both sides of the stairs and use them. Fix handrails that are broken or loose. Make sure that handrails are as long as the stairways.  Check any carpeting to make sure that it is firmly attached to the stairs. Fix any carpet that is loose or worn.  Avoid having  throw rugs at the top or bottom of the stairs. If you do have throw rugs, attach them to the floor with carpet tape.  Make sure that you have a light switch at the top of the stairs and the bottom of the stairs. If you do not have them, ask someone to add them for you. What else can I do to help prevent falls?  Wear shoes that:  Do not have high heels.  Have rubber bottoms.  Are comfortable and fit you well.  Are closed at the toe. Do not wear sandals.  If you use a stepladder:  Make sure that it is fully opened. Do not climb a closed stepladder.  Make sure that both sides of the stepladder are locked into place.  Ask someone to hold it for you, if possible.  Clearly mark and make sure that you can see:  Any grab bars or handrails.  First and last steps.  Where the edge of each step is.  Use tools that help you move around (mobility aids) if they are needed. These include:  Canes.  Walkers.  Scooters.  Crutches.  Turn on the lights when you go into a dark area. Replace any light bulbs as soon as they burn out.  Set up your furniture so you have a clear path. Avoid moving your furniture around.  If any of your floors are uneven, fix them.  If there are any pets around you, be aware of where they are.  Review your medicines with your doctor. Some medicines can make you feel dizzy. This can increase your chance of falling. Ask your doctor what other things that you can do to help prevent falls. This information is not intended to replace advice given to you by your health care provider. Make sure you discuss any questions you have with your health care provider. Document Released: 05/17/2009 Document Revised: 12/27/2015 Document Reviewed: 08/25/2014 Elsevier Interactive Patient Education  2017 [employer].

## 2019-09-05 NOTE — Progress Notes (Signed)
This visit is being conducted via phone call due to the COVID-19 pandemic. This patient has given me verbal consent via phone to conduct this visit, patient states they are participating from their home address. Some vital signs may be absent or patient reported.   Patient identification: identified by name, DOB, and current address.  Location provider: [facility], Office Persons participating in the virtual visit: Allison Corum and Franne Forts, LPN.    Subjective:   Garrett Vargas is a 74 y.o. male who presents for Medicare Annual/Subsequent preventive examination.  Garrett Vargas is doing well at this time. He is playing golf 2-3 days per week when the weather is cooperative. He also works out at a gym 2-4 days per week.   Review of Systems:  No ROS; Annual Wellness subsequent visit   Cardiac Risk Factors include: male gender;dyslipidemia;advanced age (>26men, >57 women)    Objective:    Vitals: BP 130/75   Ht 6\' 2"  (1.88 m)   Wt 184 lb (83.5 kg)   BMI 23.62 kg/m   Body mass index is 23.62 kg/m.  Advanced Directives 09/05/2019 10/11/2018 07/05/2018 06/23/2017 07/18/2015 07/18/2015 07/13/2015  Does Patient Have a Medical Advance Directive? Yes No Yes Yes Yes - Yes  Type of Paramedic of [place];Living will - - - Living will;Healthcare Power of Attorney - Living will;Healthcare Power of Attorney  Does patient want to make changes to medical advance directive? No - Patient declined - - - No - Patient declined - No - Patient declined  Copy of [place] in Chart? No - copy requested - - - No - copy requested (No Data) No - copy requested  Would patient like information on creating a medical advance directive? - No - Patient declined - - - - -    Tobacco Social History   Tobacco Use  Smoking Status Former Smoker  . Packs/day: 0.50  . Years: 10.00  . Pack years: 5.00  . Types: Cigarettes  . Quit date: 08/05/1987  . Years since  quitting: 32.1  Smokeless Tobacco Never Used     Counseling given: Not Answered   Clinical Intake:  Pre-visit preparation completed: Yes  Pain : No/denies pain     BMI - recorded: 23.62 Nutritional Status: BMI of 19-24  Normal Diabetes: No  How often do you need to have someone help you when you read instructions, pamphlets, or other written materials from your doctor or pharmacy?: 1 - Never  Interpreter Needed?: No  Information entered by :: Franne Forts, LPN.  Past Medical History:  Diagnosis Date  . A-fib ([place])   . ALLERGIC RHINITIS   . Arthritis   . Colon polyp   . Dysrhythmia   . Erectile dysfunction   . History of bronchitis as a child   . Hyperlipemia   . Hypertension   . S/P lateral meniscectomy of left knee    Past Surgical History:  Procedure Laterality Date  . CARDIOVERSION N/A 03/17/2014   Procedure: CARDIOVERSION;  Surgeon: Sanda Klein, MD;  Location: [facility];  Service: Cardiovascular;  Laterality: N/A;  . colonscopy     . ELBOW ARTHROSCOPY Bilateral   . LOOP RECORDER INSERTION N/A 04/12/2018   Procedure: LOOP RECORDER INSERTION;  Surgeon: Evans Lance, MD;  Location: [facility];  Service: Cardiovascular;  Laterality: N/A;  . Multiple Knee,elbow and foot surgeries    . neuroma left foot,2010, Dr. Anne Fu    . TEE WITHOUT CARDIOVERSION N/A  03/17/2014   Procedure: TRANSESOPHAGEAL ECHOCARDIOGRAM (TEE);  Surgeon: Sanda Klein, MD;  Location: [facility];  Service: Cardiovascular;  Laterality: N/A;  . TOTAL HIP ARTHROPLASTY Left 07/18/2015   Procedure: LEFT TOTAL HIP ARTHROPLASTY ANTERIOR APPROACH;  Surgeon: Gaynelle Arabian, MD;  Location: [facility];  Service: Orthopedics;  Laterality: Left;   Family History  Problem Relation Age of Onset  . Coronary artery disease Other   . Prostate cancer Other   . Stroke Other   . Heart disease Mother   . Stroke Mother 73  . Prostate cancer Father 56  . Kidney disease Sister    Social  History   Socioeconomic History  . Marital status: Married    Spouse name: Not on file  . Number of children: 3  . Years of education: Not on file  . Highest education level: Professional school degree (e.g., MD, DDS, DVM, JD)  Occupational History  . Occupation: retired Chief Financial Officer  . Occupation: former smoker  . Occupation: alocohol use-yes  . Occupation: Drug use-no  . Occupation: regular exercise-yes  Tobacco Use  . Smoking status: Former Smoker    Packs/day: 0.50    Years: 10.00    Pack years: 5.00    Types: Cigarettes    Quit date: 08/05/1987    Years since quitting: 32.1  . Smokeless tobacco: Never Used  Substance and Sexual Activity  . Alcohol use: Yes    Alcohol/week: 0.0 standard drinks    Comment: occasional  . Drug use: No  . Sexual activity: Not on file  Other Topics Concern  . Not on file  Social History Narrative  . Not on file   Social Determinants of Health   Financial Resource Strain:   . Difficulty of Paying Living Expenses: Not on file  Food Insecurity:   . Worried About Charity fundraiser in the Last Year: Not on file  . Ran Out of Food in the Last Year: Not on file  Transportation Needs:   . Lack of Transportation (Medical): Not on file  . Lack of Transportation (Non-Medical): Not on file  Physical Activity:   . Days of Exercise per Week: Not on file  . Minutes of Exercise per Session: Not on file  Stress:   . Feeling of Stress : Not on file  Social Connections: Not Isolated  . Frequency of Communication with Friends and Family: Three times a week  . Frequency of Social Gatherings with Friends and Family: Twice a week  . Attends Religious Services: More than 4 times per year  . Active Member of Clubs or Organizations: Yes  . Attends Archivist Meetings: More than 4 times per year  . Marital Status: Married    Outpatient Encounter Medications as of 09/05/2019  Medication Sig  . atenolol (TENORMIN) 25 MG tablet TAKE ONE-HALF  TABLET BY  MOUTH DAILY. MAY TAKE AN  EXTRA ONE-HALF TABLET DAILY FOR PALPITATIONS OR HEART  RACING.  Marland Kitchen atorvastatin (LIPITOR) 20 MG tablet TAKE 1 TABLET BY MOUTH  DAILY  . flecainide (TAMBOCOR) 50 MG tablet Take 1 tablet (50 mg total) by mouth 2 (two) times daily.  . Multiple Vitamins-Minerals (CENTRUM SILVER PO) Take 1 capsule by mouth daily.  . niacin 500 MG tablet Take 500 mg by mouth at bedtime.  . Omega-3 Fatty Acids (FISH OIL PO) Take 1 capsule by mouth daily.   Marland Kitchen omeprazole (PRILOSEC) 20 MG capsule Take 20 mg by mouth daily.  . tadalafil (CIALIS) 5 MG tablet Take  5 mg by mouth daily as needed for erectile dysfunction.  Alveda Reasons 20 MG TABS tablet TAKE 1 TABLET BY MOUTH  DAILY WITH SUPPER   No facility-administered encounter medications on file as of 09/05/2019.    Activities of Daily Living In your present state of health, do you have any difficulty performing the following activities: 09/05/2019  Hearing? N  Vision? N  Difficulty concentrating or making decisions? N  Walking or climbing stairs? N  Dressing or bathing? N  Doing errands, shopping? N  Preparing Food and eating ? N  Using the Toilet? N  In the past six months, have you accidently leaked urine? N  Do you have problems with loss of bowel control? N  Managing your Medications? N  Managing your Finances? N  Housekeeping or managing your Housekeeping? N  Some recent data might be hidden    Patient Care Team: Caren Macadam, MD as PCP - General (Family Medicine) Macario Carls, MD as Referring Physician (Dermatology)   Assessment:   This is a routine wellness examination for Khayden.  Exercise Activities and Dietary recommendations Current Exercise Habits: Structured exercise class, Type of exercise: strength training/weights;calisthenics(stationary bank), Time (Minutes): 60, Frequency (Times/Week): 5, Weekly Exercise (Minutes/Week): 300, Intensity: Moderate, Exercise limited by: None identified  Goals    .  patient     To maintain your health and exercise  Take time to travel and enjoy your time when pandemic settles         Fall Risk Fall Risk  09/05/2019 01/17/2019 07/05/2018 06/23/2017 05/09/2015  Falls in the past year? 0 0 0 No No  Number falls in past yr: - 0 - - -  Risk for fall due to : Medication side effect - - - -  Follow up Falls evaluation completed;Education provided;Falls prevention discussed - - - -   Is the patient's home free of loose throw rugs in walkways, pet beds, electrical cords, etc?   yes      Grab bars in the bathroom? yes      Handrails on the stairs?   yes      Adequate lighting?   yes  Timed Get Up and Go Performed: N/A due to telephone visit  Depression Screen PHQ 2/9 Scores 09/05/2019 01/17/2019 07/05/2018 06/23/2017  PHQ - 2 Score 0 0 0 0    Cognitive Function MMSE - Mini Mental State Exam 07/05/2018  Not completed: (No Data)     6CIT Screen 09/05/2019  What Year? 0 points  What month? 0 points  What time? 0 points  Count back from 20 0 points  Months in reverse 0 points  Repeat phrase 0 points  Total Score 0    Immunization History  Administered Date(s) Administered  . Hepatitis A 07/02/2012, 01/10/2013  . Influenza Nasal 05/04/2017  . Influenza Split 04/27/2012  . Influenza Whole 05/04/2009  . Influenza, High Dose Seasonal PF 06/04/2018  . Influenza-Unspecified 05/04/2014, 05/07/2015, 04/28/2016  . PPD Test 04/16/2011, 04/27/2012, 04/27/2013, 04/11/2014  . Pneumococcal Conjugate-13 04/06/2014  . Pneumococcal Polysaccharide-23 12/10/2010  . Td 01/08/2005  . Tdap 05/09/2015  . Zoster 08/13/2009  . Zoster Recombinat (Shingrix) 09/02/2018, 02/23/2019    Qualifies for Shingles Vaccine? Patient has already completed  Screening Tests Health Maintenance  Topic Date Due  . INFLUENZA VACCINE  03/05/2019  . COLONOSCOPY  05/21/2022  . TETANUS/TDAP  05/08/2025  . Hepatitis C Screening  Completed  . PNA vac Low Risk Adult  Completed   Cancer  Screenings: Lung: Low Dose CT Chest recommended if Age 60-80 years, 30 pack-year currently smoking OR have quit w/in 15years. Patient does not qualify. Colorectal: yes  Additional Screenings:  Hepatitis C Screening:completed 06/29/2017.      Plan:   Saeed Alleyne is up to date at this time with preventive health screenings and immunizations. He is due for a colonoscopy in October 2021. He has completed both covid vaccines as well.   I have personally reviewed and noted the following in the patient's chart:   . Medical and social history . Use of alcohol, tobacco or illicit drugs  . Current medications and supplements . Functional ability and status . Nutritional status . Physical activity . Advanced directives . List of other physicians . Hospitalizations, surgeries, and ER visits in previous 12 months . Vitals . Screenings to include cognitive, depression, and falls . Referrals and appointments  In addition, I have reviewed and discussed with patient certain preventive protocols, quality metrics, and best practice recommendations. A written personalized care plan for preventive services as well as general preventive health recommendations were provided to patient.     Franne Forts, LPN  [id number]

## 2019-09-22 ENCOUNTER — Ambulatory Visit (INDEPENDENT_AMBULATORY_CARE_PROVIDER_SITE_OTHER): Payer: Medicare Other | Admitting: *Deleted

## 2019-09-22 DIAGNOSIS — R55 Syncope and collapse: Secondary | ICD-10-CM | POA: Diagnosis not present

## 2019-09-23 LAB — CUP PACEART REMOTE DEVICE CHECK
Date Time Interrogation Session: 20210218231908
Implantable Pulse Generator Implant Date: 20190909

## 2019-10-24 ENCOUNTER — Ambulatory Visit (INDEPENDENT_AMBULATORY_CARE_PROVIDER_SITE_OTHER): Payer: Medicare Other | Admitting: *Deleted

## 2019-10-24 DIAGNOSIS — R55 Syncope and collapse: Secondary | ICD-10-CM | POA: Diagnosis not present

## 2019-10-24 LAB — CUP PACEART REMOTE DEVICE CHECK
Date Time Interrogation Session: 20210322021503
Implantable Pulse Generator Implant Date: 20190909

## 2019-10-24 NOTE — Progress Notes (Signed)
ILR Remote 

## 2019-11-24 ENCOUNTER — Ambulatory Visit (INDEPENDENT_AMBULATORY_CARE_PROVIDER_SITE_OTHER): Payer: Medicare Other | Admitting: *Deleted

## 2019-11-24 DIAGNOSIS — R55 Syncope and collapse: Secondary | ICD-10-CM

## 2019-11-24 LAB — CUP PACEART REMOTE DEVICE CHECK
Date Time Interrogation Session: 20210422022015
Implantable Pulse Generator Implant Date: 20190909

## 2019-11-25 NOTE — Progress Notes (Signed)
ILR Remote 

## 2019-11-26 ENCOUNTER — Other Ambulatory Visit: Payer: Self-pay | Admitting: Internal Medicine

## 2019-11-26 ENCOUNTER — Other Ambulatory Visit: Payer: Self-pay | Admitting: Family Medicine

## 2019-11-26 DIAGNOSIS — E7800 Pure hypercholesterolemia, unspecified: Secondary | ICD-10-CM

## 2019-12-26 LAB — CUP PACEART REMOTE DEVICE CHECK
Date Time Interrogation Session: 20210523042329
Implantable Pulse Generator Implant Date: 20190909

## 2019-12-27 ENCOUNTER — Ambulatory Visit (INDEPENDENT_AMBULATORY_CARE_PROVIDER_SITE_OTHER): Payer: Medicare Other | Admitting: *Deleted

## 2019-12-27 DIAGNOSIS — R55 Syncope and collapse: Secondary | ICD-10-CM

## 2019-12-27 NOTE — Progress Notes (Signed)
[facility] Summary Report / Loop Recorder 

## 2019-12-31 ENCOUNTER — Other Ambulatory Visit: Payer: Self-pay | Admitting: Internal Medicine

## 2020-01-03 NOTE — Telephone Encounter (Signed)
Xarelto 20mg  refill request received. Pt is 74 years old, weight-83.5kg, Crea-0.84 on 01/17/2019, last seen by Dr. Lovena Le on 06/22/2019, Diagnosis-Afib, CrCl-91.82ml/min; Dose is appropriate based on dosing criteria. Will send in refill to requested pharmacy.

## 2020-01-30 ENCOUNTER — Ambulatory Visit (INDEPENDENT_AMBULATORY_CARE_PROVIDER_SITE_OTHER): Payer: Medicare Other | Admitting: *Deleted

## 2020-01-30 DIAGNOSIS — R55 Syncope and collapse: Secondary | ICD-10-CM | POA: Diagnosis not present

## 2020-01-31 LAB — CUP PACEART REMOTE DEVICE CHECK
Date Time Interrogation Session: 20210628232630
Implantable Pulse Generator Implant Date: 20190909

## 2020-02-01 NOTE — Progress Notes (Signed)
[facility] Summary Report / Loop Recorder 

## 2020-02-13 ENCOUNTER — Other Ambulatory Visit: Payer: Self-pay | Admitting: Family Medicine

## 2020-02-13 DIAGNOSIS — E7800 Pure hypercholesterolemia, unspecified: Secondary | ICD-10-CM

## 2020-02-20 DIAGNOSIS — D1801 Hemangioma of skin and subcutaneous tissue: Secondary | ICD-10-CM | POA: Diagnosis not present

## 2020-02-20 DIAGNOSIS — D229 Melanocytic nevi, unspecified: Secondary | ICD-10-CM | POA: Diagnosis not present

## 2020-02-20 DIAGNOSIS — L905 Scar conditions and fibrosis of skin: Secondary | ICD-10-CM | POA: Diagnosis not present

## 2020-02-20 DIAGNOSIS — L819 Disorder of pigmentation, unspecified: Secondary | ICD-10-CM | POA: Diagnosis not present

## 2020-02-20 DIAGNOSIS — L570 Actinic keratosis: Secondary | ICD-10-CM | POA: Diagnosis not present

## 2020-02-20 DIAGNOSIS — L821 Other seborrheic keratosis: Secondary | ICD-10-CM | POA: Diagnosis not present

## 2020-03-03 ENCOUNTER — Ambulatory Visit (INDEPENDENT_AMBULATORY_CARE_PROVIDER_SITE_OTHER): Payer: Medicare Other | Admitting: *Deleted

## 2020-03-03 DIAGNOSIS — R55 Syncope and collapse: Secondary | ICD-10-CM

## 2020-03-06 LAB — CUP PACEART REMOTE DEVICE CHECK
Date Time Interrogation Session: 20210731233136
Implantable Pulse Generator Implant Date: 20190909

## 2020-03-14 ENCOUNTER — Other Ambulatory Visit: Payer: Self-pay | Admitting: Family Medicine

## 2020-03-14 ENCOUNTER — Ambulatory Visit (INDEPENDENT_AMBULATORY_CARE_PROVIDER_SITE_OTHER): Payer: Medicare Other | Admitting: Family Medicine

## 2020-03-14 ENCOUNTER — Other Ambulatory Visit: Payer: Self-pay

## 2020-03-14 ENCOUNTER — Encounter: Payer: Self-pay | Admitting: Family Medicine

## 2020-03-14 VITALS — BP 108/80 | HR 61 | Temp 98.6°F | Ht 74.0 in | Wt 182.7 lb

## 2020-03-14 DIAGNOSIS — E7800 Pure hypercholesterolemia, unspecified: Secondary | ICD-10-CM | POA: Diagnosis not present

## 2020-03-14 DIAGNOSIS — Z0000 Encounter for general adult medical examination without abnormal findings: Secondary | ICD-10-CM

## 2020-03-14 DIAGNOSIS — I10 Essential (primary) hypertension: Secondary | ICD-10-CM

## 2020-03-14 MED ORDER — ATENOLOL 25 MG PO TABS: ORAL_TABLET | ORAL | 3 refills | Status: DC

## 2020-03-14 NOTE — Progress Notes (Signed)
[facility] Summary Report / Loop Recorder 

## 2020-03-14 NOTE — Progress Notes (Signed)
Garrett Vargas DOB: 04/16/1946 Encounter date: 03/14/2020  This is a 74 y.o. male who presents for complete physical   History of present illness/Additional concerns: Wondered when checking pressures at home - consistently running slightly lower. Went off atenolol for awhile - and bp didn't change much but HR went up a little. Thinks HR went up into mid 80's. Has been taking whole atenolol. No issues with feeling light headed or dizzy.   HTN: bp has been on lower end.   A fib: no episodes for over a year. Continuing with felcainide, xarelto. No episodes since starting flecainide.   HL: doing well with lipitor. Taking niacin and omega 3.   Allergies:sometimes when he gets up in morning has clear nasal drainage. "allergic to life" first hour, then clears and he is fine. Using nasal spray on and off - flonase if needed.  Due for repeat colonoscopy in October.   No reflux symptoms; on omeprazole.   Follows with Dr. Pearline Cables for derm.  Follows with Dr. Quay Burow for eyes.   Past Medical History:  Diagnosis Date   A-fib ([place])    ALLERGIC RHINITIS    Arthritis    Colon polyp    Dysrhythmia    Erectile dysfunction    History of bronchitis as a child    Hyperlipemia    Hypertension    S/P lateral meniscectomy of left knee    Past Surgical History:  Procedure Laterality Date   CARDIOVERSION N/A 03/17/2014   Procedure: CARDIOVERSION;  Surgeon: Sanda Klein, MD;  Location: [facility];  Service: Cardiovascular;  Laterality: N/A;   colonscopy      ELBOW ARTHROSCOPY Bilateral    LOOP RECORDER INSERTION N/A 04/12/2018   Procedure: LOOP RECORDER INSERTION;  Surgeon: Evans Lance, MD;  Location: [facility];  Service: Cardiovascular;  Laterality: N/A;   Multiple Knee,elbow and foot surgeries     neuroma left foot,2010, Dr. Anne Fu     TEE WITHOUT CARDIOVERSION N/A 03/17/2014   Procedure: TRANSESOPHAGEAL ECHOCARDIOGRAM (TEE);  Surgeon: Sanda Klein, MD;   Location: [facility];  Service: Cardiovascular;  Laterality: N/A;   TOTAL HIP ARTHROPLASTY Left 07/18/2015   Procedure: LEFT TOTAL HIP ARTHROPLASTY ANTERIOR APPROACH;  Surgeon: Gaynelle Arabian, MD;  Location: [facility];  Service: Orthopedics;  Laterality: Left;   No Known Allergies Current Meds  Medication Sig   atenolol (TENORMIN) 25 MG tablet TAKE ONE-HALF TABLET BY  MOUTH DAILY.   atorvastatin (LIPITOR) 20 MG tablet TAKE 1 TABLET BY MOUTH  DAILY   flecainide (TAMBOCOR) 50 MG tablet TAKE 1 TABLET BY MOUTH  TWICE DAILY   Multiple Vitamins-Minerals (CENTRUM SILVER PO) Take 1 capsule by mouth daily.   niacin 500 MG tablet Take 500 mg by mouth at bedtime.   Omega-3 Fatty Acids (FISH OIL PO) Take 1 capsule by mouth daily.    omeprazole (PRILOSEC) 20 MG capsule Take 20 mg by mouth daily.   tadalafil (CIALIS) 5 MG tablet Take 5 mg by mouth daily as needed for erectile dysfunction.   XARELTO 20 MG TABS tablet TAKE 1 TABLET BY MOUTH  DAILY WITH SUPPER   [DISCONTINUED] atenolol (TENORMIN) 25 MG tablet TAKE ONE-HALF TABLET BY  MOUTH DAILY. MAY TAKE AN  EXTRA ONE-HALF TABLET DAILY FOR PALPITATIONS OR HEART  RACING.   [DISCONTINUED] atenolol (TENORMIN) 25 MG tablet TAKE ONE-HALF TABLET BY  MOUTH DAILY.   Social History   Tobacco Use   Smoking status: Former Smoker    Packs/day: 0.50  Years: 10.00    Pack years: 5.00    Types: Cigarettes    Quit date: 08/05/1987    Years since quitting: 32.6   Smokeless tobacco: Never Used  Substance Use Topics   Alcohol use: Yes    Alcohol/week: 0.0 standard drinks    Comment: occasional   Family History  Problem Relation Age of Onset   Coronary artery disease Other    Prostate cancer Other    Stroke Other    Heart disease Mother    Stroke Mother 9   Prostate cancer Father 81   Kidney disease Sister      Review of Systems  Constitutional: Negative for activity change, appetite change, chills, fatigue, fever and unexpected  weight change.  HENT: Negative for congestion, ear pain, hearing loss, sinus pressure, sinus pain, sore throat and trouble swallowing.   Eyes: Negative for pain and visual disturbance.  Respiratory: Negative for cough, chest tightness, shortness of breath and wheezing.   Cardiovascular: Negative for chest pain, palpitations and leg swelling.  Gastrointestinal: Negative for abdominal distention, abdominal pain, blood in stool, constipation, diarrhea, nausea and vomiting.  Genitourinary: Negative for decreased urine volume, difficulty urinating, dysuria, penile pain and testicular pain.  Musculoskeletal: Negative for arthralgias, back pain and joint swelling.  Skin: Negative for rash.  Neurological: Negative for dizziness, weakness, numbness and headaches.  Hematological: Negative for adenopathy. Does not bruise/bleed easily.  Psychiatric/Behavioral: Negative for agitation, sleep disturbance and suicidal ideas. The patient is not nervous/anxious.     CBC:  Lab Results  Component Value Date   WBC 7.0 01/17/2019   HGB 16.0 01/17/2019   HCT 48.6 01/17/2019   MCH 29.4 10/11/2018   MCHC 33.0 01/17/2019   RDW 13.9 01/17/2019   PLT 224.0 01/17/2019   CMP: Lab Results  Component Value Date   NA 139 01/17/2019   K 4.1 01/17/2019   CL 102 01/17/2019   CO2 31 01/17/2019   ANIONGAP 8 10/11/2018   GLUCOSE 82 01/17/2019   BUN 14 01/17/2019   CREATININE 0.84 01/17/2019   GFRAA >60 10/11/2018   CALCIUM 9.3 01/17/2019   PROT 6.4 01/17/2019   BILITOT 1.2 01/17/2019   ALKPHOS 50 01/17/2019   ALT 23 01/17/2019   AST 22 01/17/2019   LIPID: Lab Results  Component Value Date   CHOL 166 01/17/2019   TRIG 92.0 01/17/2019   HDL 46.60 01/17/2019   LDLCALC 101 (H) 01/17/2019    Objective:  BP 108/80 (BP Location: Left Arm, Patient Position: Sitting, Cuff Size: Normal)    Pulse 61    Temp 98.6 F (37 C) (Oral)    Ht 6\' 2"  (1.88 m)    Wt 182 lb 11.2 oz (82.9 kg)    BMI 23.46 kg/m   Weight:  182 lb 11.2 oz (82.9 kg)   BP Readings from Last 3 Encounters:  03/14/20 108/80  09/05/19 130/75  06/22/19 104/68   Wt Readings from Last 3 Encounters:  03/14/20 182 lb 11.2 oz (82.9 kg)  09/05/19 184 lb (83.5 kg)  06/22/19 189 lb (85.7 kg)    Physical Exam Constitutional:      General: He is not in acute distress.    Appearance: He is well-developed.  HENT:     Head: Normocephalic and atraumatic.     Right Ear: External ear normal.     Left Ear: External ear normal.     Nose: Nose normal.     Mouth/Throat:     Pharynx: No oropharyngeal exudate.  Eyes:     Conjunctiva/sclera: Conjunctivae normal.     Pupils: Pupils are equal, round, and reactive to light.  Neck:     Thyroid: No thyromegaly.  Cardiovascular:     Rate and Rhythm: Normal rate and regular rhythm.     Heart sounds: Normal heart sounds. No murmur heard.  No friction rub. No gallop.   Pulmonary:     Effort: Pulmonary effort is normal. No respiratory distress.     Breath sounds: Normal breath sounds. No stridor. No wheezing or rales.  Abdominal:     General: Bowel sounds are normal.     Palpations: Abdomen is soft.  Musculoskeletal:        General: Normal range of motion.     Cervical back: Neck supple.  Skin:    General: Skin is warm and dry.  Neurological:     Mental Status: He is alert and oriented to person, place, and time.  Psychiatric:        Behavior: Behavior normal.        Thought Content: Thought content normal.        Judgment: Judgment normal.     Assessment/Plan: Health Maintenance Due  Topic Date Due   INFLUENZA VACCINE  03/04/2020   Health Maintenance reviewed.  1. Preventative health care Keep up with healthy eating and exercise. - PSA; Future  2. Essential hypertension, benign Well controlled; he is going to try to cut atenolol in half and monitor pressures/heart rate. - atenolol (TENORMIN) 25 MG tablet; TAKE ONE-HALF TABLET BY  MOUTH DAILY.  Dispense: 90 tablet; Refill:  3 - CBC with Differential/Platelet; Future - Comprehensive metabolic panel; Future  3. Pure hypercholesterolemia - Lipid panel; Future  Return in about 6 months (around 09/14/2020).  Micheline Rough, MD

## 2020-03-15 LAB — CBC WITH DIFFERENTIAL/PLATELET
Absolute Monocytes: 484 cells/uL (ref 200–950)
Basophils Absolute: 41 cells/uL (ref 0–200)
Basophils Relative: 0.7 %
Eosinophils Absolute: 236 cells/uL (ref 15–500)
Eosinophils Relative: 4 %
HCT: 48.1 % (ref 38.5–50.0)
Hemoglobin: 16 g/dL (ref 13.2–17.1)
Lymphs Abs: 1977 cells/uL (ref 850–3900)
MCH: 31.8 pg (ref 27.0–33.0)
MCHC: 33.3 g/dL (ref 32.0–36.0)
MCV: 95.6 fL (ref 80.0–100.0)
MPV: 10.3 fL (ref 7.5–12.5)
Monocytes Relative: 8.2 %
Neutro Abs: 3162 cells/uL (ref 1500–7800)
Neutrophils Relative %: 53.6 %
Platelets: 223 10*3/uL (ref 140–400)
RBC: 5.03 10*6/uL (ref 4.20–5.80)
RDW: 12.3 % (ref 11.0–15.0)
Total Lymphocyte: 33.5 %
WBC: 5.9 10*3/uL (ref 3.8–10.8)

## 2020-03-15 LAB — LIPID PANEL
Cholesterol: 191 mg/dL (ref <200)
HDL: 53 mg/dL (ref >=40)
LDL Cholesterol (Calc): 117 mg/dL (calc) — ABNORMAL HIGH
Non-HDL Cholesterol (Calc): 138 mg/dL (calc) — ABNORMAL HIGH (ref <130)
Total CHOL/HDL Ratio: 3.6 (calc) (ref <5.0)
Triglycerides: 104 mg/dL (ref <150)

## 2020-03-15 LAB — COMPREHENSIVE METABOLIC PANEL
AG Ratio: 2.1 (calc) (ref 1.0–2.5)
ALT: 19 U/L (ref 9–46)
AST: 22 U/L (ref 10–35)
Albumin: 4.4 g/dL (ref 3.6–5.1)
Alkaline phosphatase (APISO): 52 U/L (ref 35–144)
BUN: 13 mg/dL (ref 7–25)
CO2: 30 mmol/L (ref 20–32)
Calcium: 9.6 mg/dL (ref 8.6–10.3)
Chloride: 103 mmol/L (ref 98–110)
Creat: 0.9 mg/dL (ref 0.70–1.18)
Globulin: 2.1 g/dL (calc) (ref 1.9–3.7)
Glucose, Bld: 94 mg/dL (ref 65–99)
Potassium: 4.4 mmol/L (ref 3.5–5.3)
Sodium: 141 mmol/L (ref 135–146)
Total Bilirubin: 1.3 mg/dL — ABNORMAL HIGH (ref 0.2–1.2)
Total Protein: 6.5 g/dL (ref 6.1–8.1)

## 2020-03-15 LAB — PSA: PSA: 1.8 ng/mL (ref <=4.0)

## 2020-04-05 ENCOUNTER — Ambulatory Visit (INDEPENDENT_AMBULATORY_CARE_PROVIDER_SITE_OTHER): Payer: Medicare Other | Admitting: *Deleted

## 2020-04-05 DIAGNOSIS — R55 Syncope and collapse: Secondary | ICD-10-CM | POA: Diagnosis not present

## 2020-04-06 LAB — CUP PACEART REMOTE DEVICE CHECK
Date Time Interrogation Session: 20210902233404
Implantable Pulse Generator Implant Date: 20190909

## 2020-04-10 NOTE — Progress Notes (Signed)
[facility] Summary Report / Loop Recorder 

## 2020-05-01 DIAGNOSIS — H2513 Age-related nuclear cataract, bilateral: Secondary | ICD-10-CM | POA: Diagnosis not present

## 2020-05-08 ENCOUNTER — Ambulatory Visit (INDEPENDENT_AMBULATORY_CARE_PROVIDER_SITE_OTHER): Payer: Medicare Other

## 2020-05-08 DIAGNOSIS — R55 Syncope and collapse: Secondary | ICD-10-CM | POA: Diagnosis not present

## 2020-05-09 LAB — CUP PACEART REMOTE DEVICE CHECK
Date Time Interrogation Session: 20211005233645
Implantable Pulse Generator Implant Date: 20190909

## 2020-05-10 NOTE — Progress Notes (Signed)
[facility] Summary Report / Loop Recorder 

## 2020-05-29 ENCOUNTER — Other Ambulatory Visit: Payer: Self-pay | Admitting: Internal Medicine

## 2020-05-31 NOTE — Telephone Encounter (Signed)
Xarelto 20mg  refill request received. Pt is 74 years old, weight-82.9kg, Crea-0.90 on 03/14/2020, last seen by Dr. Lovena Le on 06/22/2019 and has an appt with Dr. Lovena Le on 07/09/2020, Diagnosis-PAF, CrCl-84.45ml/min; Dose is appropriate based on dosing criteria. Will send in refill to requested pharmacy.

## 2020-06-05 ENCOUNTER — Other Ambulatory Visit: Payer: Self-pay | Admitting: Family Medicine

## 2020-06-05 DIAGNOSIS — E7800 Pure hypercholesterolemia, unspecified: Secondary | ICD-10-CM

## 2020-06-11 ENCOUNTER — Ambulatory Visit (INDEPENDENT_AMBULATORY_CARE_PROVIDER_SITE_OTHER): Payer: Medicare Other

## 2020-06-11 DIAGNOSIS — R55 Syncope and collapse: Secondary | ICD-10-CM

## 2020-06-11 LAB — CUP PACEART REMOTE DEVICE CHECK
Date Time Interrogation Session: 20211107224752
Implantable Pulse Generator Implant Date: 20190909

## 2020-06-11 NOTE — Progress Notes (Signed)
[facility] Summary Report / Loop Recorder 

## 2020-07-09 ENCOUNTER — Encounter: Payer: Self-pay | Admitting: Internal Medicine

## 2020-07-09 ENCOUNTER — Other Ambulatory Visit: Payer: Self-pay

## 2020-07-09 ENCOUNTER — Ambulatory Visit: Payer: Medicare Other | Admitting: Internal Medicine

## 2020-07-09 VITALS — BP 116/68 | HR 73 | Ht 74.0 in | Wt 185.6 lb

## 2020-07-09 DIAGNOSIS — I480 Paroxysmal atrial fibrillation: Secondary | ICD-10-CM

## 2020-07-09 DIAGNOSIS — R55 Syncope and collapse: Secondary | ICD-10-CM

## 2020-07-09 NOTE — Patient Instructions (Signed)
Medication Instructions:  Your physician recommends that you continue on your current medications as directed. Please refer to the Current Medication list given to you today.  Labwork: None ordered.  Testing/Procedures: None ordered.  Follow-Up: Your physician wants you to follow-up in: one year with Dr. Lovena Le.   You will receive a reminder letter in the mail two months in advance. If you don't receive a letter, please call our office to schedule the follow-up appointment.  Remote monthly monitoring is used to monitor your loop recorder from home.  Any Other Special Instructions Will Be Listed Below (If Applicable).  If you need a refill on your cardiac medications before your next appointment, please call your pharmacy.

## 2020-07-09 NOTE — Progress Notes (Signed)
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 He is a pleasant 74 yo man with a h/o PAF, tachy induced CM that resolved, remote tobacco abuse and syncope, s/p ILR insertion. Several months ago his atrial fib/flutter was increasing in frequency and he was started on low dose flecainide. He has done well in the interim. He has not had palpitations or syncope. No chest pain or sob. No edema No Known Allergies   Current Outpatient Medications  Medication Sig Dispense Refill  . atenolol (TENORMIN) 25 MG tablet Take 25 mg by mouth daily.    Marland Kitchen atorvastatin (LIPITOR) 20 MG tablet TAKE 1 TABLET BY MOUTH  DAILY 90 tablet 0  . flecainide (TAMBOCOR) 50 MG tablet Take 1 tablet (50 mg total) by mouth 2 (two) times daily. Please keep upcoming appt in December with Dr. Lovena Le for future refills. Thank you 180 tablet 0  . Multiple Vitamins-Minerals (CENTRUM SILVER PO) Take 1 capsule by mouth daily.    . niacin 500 MG tablet Take 500 mg by mouth at bedtime.    . Omega-3 Fatty Acids (FISH OIL PO) Take 1 capsule by mouth daily.     Marland Kitchen omeprazole (PRILOSEC) 20 MG capsule Take 20 mg by mouth daily.    . tadalafil (CIALIS) 5 MG tablet Take 5 mg by mouth daily as needed for erectile dysfunction.    Alveda Reasons 20 MG TABS tablet TAKE 1 TABLET BY MOUTH  DAILY WITH SUPPER 90 tablet 3   No current facility-administered medications for this visit.     Past Medical History:  Diagnosis Date  . A-fib ([place])   . ALLERGIC RHINITIS   . Arthritis   . Colon polyp   . Dysrhythmia   . Erectile dysfunction   . History of bronchitis as a child   . Hyperlipemia   . Hypertension   . S/P lateral meniscectomy of left knee     ROS:   All systems reviewed and negative except as noted in the HPI.   Past Surgical History:  Procedure Laterality Date  . CARDIOVERSION N/A 03/17/2014   Procedure: CARDIOVERSION;  Surgeon: Sanda Klein, MD;  Location: [facility];  Service: Cardiovascular;  Laterality: N/A;  .  colonscopy     . ELBOW ARTHROSCOPY Bilateral   . LOOP RECORDER INSERTION N/A 04/12/2018   Procedure: LOOP RECORDER INSERTION;  Surgeon: Evans Lance, MD;  Location: [facility];  Service: Cardiovascular;  Laterality: N/A;  . Multiple Knee,elbow and foot surgeries    . neuroma left foot,2010, Dr. Anne Fu    . TEE WITHOUT CARDIOVERSION N/A 03/17/2014   Procedure: TRANSESOPHAGEAL ECHOCARDIOGRAM (TEE);  Surgeon: Sanda Klein, MD;  Location: [facility];  Service: Cardiovascular;  Laterality: N/A;  . TOTAL HIP ARTHROPLASTY Left 07/18/2015   Procedure: LEFT TOTAL HIP ARTHROPLASTY ANTERIOR APPROACH;  Surgeon: Gaynelle Arabian, MD;  Location: [facility];  Service: Orthopedics;  Laterality: Left;     Family History  Problem Relation Age of Onset  . Coronary artery disease Other   . Prostate cancer Other   . Stroke Other   . Heart disease Mother   . Stroke Mother 78  . Prostate cancer Father 14  . Kidney disease Sister      Social History   Socioeconomic History  . Marital status: Married    Spouse name: Not on file  . Number of children: 3  . Years of education: Not on file  . Highest education level: Professional school degree (e.g., MD,  DDS, DVM, JD)  Occupational History  . Occupation: retired Chief Financial Officer  . Occupation: former smoker  . Occupation: alocohol use-yes  . Occupation: Drug use-no  . Occupation: regular exercise-yes  Tobacco Use  . Smoking status: Former Smoker    Packs/day: 0.50    Years: 10.00    Pack years: 5.00    Types: Cigarettes    Quit date: 08/05/1987    Years since quitting: 32.9  . Smokeless tobacco: Never Used  Vaping Use  . Vaping Use: Never used  Substance and Sexual Activity  . Alcohol use: Yes    Alcohol/week: 0.0 standard drinks    Comment: occasional  . Drug use: No  . Sexual activity: Not on file  Other Topics Concern  . Not on file  Social History Narrative  . Not on file   Social Determinants of Health   Financial  Resource Strain:   . Difficulty of Paying Living Expenses: Not on file  Food Insecurity:   . Worried About Charity fundraiser in the Last Year: Not on file  . Ran Out of Food in the Last Year: Not on file  Transportation Needs:   . Lack of Transportation (Medical): Not on file  . Lack of Transportation (Non-Medical): Not on file  Physical Activity:   . Days of Exercise per Week: Not on file  . Minutes of Exercise per Session: Not on file  Stress:   . Feeling of Stress : Not on file  Social Connections: Socially Integrated  . Frequency of Communication with Friends and Family: Three times a week  . Frequency of Social Gatherings with Friends and Family: Twice a week  . Attends Religious Services: More than 4 times per year  . Active Member of Clubs or Organizations: Yes  . Attends Archivist Meetings: More than 4 times per year  . Marital Status: Married  Human resources officer Violence:   . Fear of Current or Ex-Partner: Not on file  . Emotionally Abused: Not on file  . Physically Abused: Not on file  . Sexually Abused: Not on file     BP 116/68   Pulse 73   Ht 6\' 2"  (1.88 m)   Wt 185 lb 9.6 oz (84.2 kg)   SpO2 96%   BMI 23.83 kg/m   Physical Exam:  Well appearing NAD HEENT: Unremarkable Neck:  No JVD, no thyromegally Lymphatics:  No adenopathy Back:  No CVA tenderness Lungs:  Clear with no wheezes HEART:  Regular rate rhythm, no murmurs, no rubs, no clicks Abd:  soft, positive bowel sounds, no organomegally, no rebound, no guarding Ext:  2 plus pulses, no edema, no cyanosis, no clubbing Skin:  No rashes no nodules Neuro:  CN II through XII intact, motor grossly intact  EKG - nsr with rbbb  DEVICE  Normal device function.  See PaceArt for details. ILR with no atrial fib  Assess/Plan: 1. PAF - he is maintaining NSR. He will continue low dose flecainide. No QRS prolongation.  2. Syncope - he has had no recurrent episodes and his ILR demonstrates no  prolonged pauses. 3. HTN - his bp is well controlled. We will follow. Continue atenolol 25 mg daily 4. 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 No change in his meds.  Carleene Overlie Paysen Goza,MD

## 2020-07-14 LAB — CUP PACEART REMOTE DEVICE CHECK
Date Time Interrogation Session: 20211210224624
Implantable Pulse Generator Implant Date: 20190909

## 2020-07-16 ENCOUNTER — Ambulatory Visit (INDEPENDENT_AMBULATORY_CARE_PROVIDER_SITE_OTHER): Payer: Medicare Other

## 2020-07-16 DIAGNOSIS — R55 Syncope and collapse: Secondary | ICD-10-CM

## 2020-07-20 ENCOUNTER — Telehealth: Payer: Self-pay | Admitting: Family Medicine

## 2020-07-20 NOTE — Progress Notes (Signed)
  Chronic Care Management   Outreach Note  07/20/2020 Name: Garrett Vargas MRN: [id number] DOB: 05/01/1946  Referred by: Caren Macadam, MD Reason for referral : No chief complaint on file.   An unsuccessful telephone outreach was attempted today. The patient was referred to the pharmacist for assistance with care management and care coordination.   Follow Up Plan:   Carley Perdue [employer] Scheduler

## 2020-07-30 NOTE — Progress Notes (Signed)
[facility] Summary Report / Loop Recorder 

## 2020-08-02 ENCOUNTER — Other Ambulatory Visit: Payer: Self-pay | Admitting: Internal Medicine

## 2020-08-06 ENCOUNTER — Other Ambulatory Visit: Payer: Self-pay | Admitting: Internal Medicine

## 2020-08-06 ENCOUNTER — Other Ambulatory Visit: Payer: Self-pay | Admitting: Family Medicine

## 2020-08-06 DIAGNOSIS — E7800 Pure hypercholesterolemia, unspecified: Secondary | ICD-10-CM

## 2020-08-20 ENCOUNTER — Ambulatory Visit (INDEPENDENT_AMBULATORY_CARE_PROVIDER_SITE_OTHER): Payer: Medicare Other

## 2020-08-20 DIAGNOSIS — R55 Syncope and collapse: Secondary | ICD-10-CM

## 2020-08-20 LAB — CUP PACEART REMOTE DEVICE CHECK
Date Time Interrogation Session: 20220112224718
Implantable Pulse Generator Implant Date: 20190909

## 2020-09-01 NOTE — Progress Notes (Signed)
[facility] Summary Report / Loop Recorder 

## 2020-09-10 ENCOUNTER — Ambulatory Visit: Payer: Medicare Other | Admitting: Family Medicine

## 2020-09-11 ENCOUNTER — Telehealth: Payer: Self-pay | Admitting: Family Medicine

## 2020-09-11 NOTE — Telephone Encounter (Signed)
Left message for patient to call back and schedule Medicare Annual Wellness Visit (AWV) either virtually or in office. No detailed message left   Last AWV 09/05/19  please schedule at anytime with [facility] Nurse Health Advisor 1 or 2   This should be a 45 minute visit.

## 2020-09-17 LAB — CUP PACEART REMOTE DEVICE CHECK
Date Time Interrogation Session: 20220214225019
Implantable Pulse Generator Implant Date: 20190909

## 2020-09-18 DIAGNOSIS — Z85828 Personal history of other malignant neoplasm of skin: Secondary | ICD-10-CM | POA: Diagnosis not present

## 2020-09-18 DIAGNOSIS — L819 Disorder of pigmentation, unspecified: Secondary | ICD-10-CM | POA: Diagnosis not present

## 2020-09-18 DIAGNOSIS — L905 Scar conditions and fibrosis of skin: Secondary | ICD-10-CM | POA: Diagnosis not present

## 2020-09-18 DIAGNOSIS — L570 Actinic keratosis: Secondary | ICD-10-CM | POA: Diagnosis not present

## 2020-09-20 ENCOUNTER — Telehealth: Payer: Self-pay | Admitting: Family Medicine

## 2020-09-20 NOTE — Progress Notes (Signed)
  Chronic Care Management   Outreach Note  09/20/2020 Name: COWAN PILAR MRN: [id number] DOB: Apr 15, 1946  Referred by: Caren Macadam, MD Reason for referral : No chief complaint on file.   A second unsuccessful telephone outreach was attempted today. The patient was referred to pharmacist for assistance with care management and care coordination.  Follow Up Plan:   Carley Perdue [employer] Scheduler

## 2020-09-24 ENCOUNTER — Ambulatory Visit (INDEPENDENT_AMBULATORY_CARE_PROVIDER_SITE_OTHER): Payer: Medicare Other

## 2020-09-24 DIAGNOSIS — R55 Syncope and collapse: Secondary | ICD-10-CM

## 2020-09-27 ENCOUNTER — Telehealth: Payer: Self-pay | Admitting: Family Medicine

## 2020-09-27 NOTE — Progress Notes (Signed)
  Chronic Care Management   Note  09/27/2020 Name: ELIZA GRISSINGER MRN: [id number] DOB: 11-29-1945  Quin Hoop Kittleson is a 75 y.o. year old male who is a primary care patient of Koberlein, Steele Berg, MD. I reached out to [name] by phone today in response to a referral sent by Mr. Colden Samaras Mathias's PCP, Caren Macadam, MD.   Mr. Borgmeyer was given information about Chronic Care Management services today including:  1. CCM service includes personalized support from designated clinical staff supervised by his physician, including individualized plan of care and coordination with other care providers 2. 24/7 contact phone numbers for assistance for urgent and routine care needs. 3. Service will only be billed when office clinical staff spend 20 minutes or more in a month to coordinate care. 4. Only one practitioner may furnish and bill the service in a calendar month. 5. The patient may stop CCM services at any time (effective at the end of the month) by phone call to the office staff.   Patient agreed to services and verbal consent obtained.   Follow up plan:   Carley Perdue [employer] Scheduler

## 2020-09-27 NOTE — Progress Notes (Signed)
  Chronic Care Management   Outreach Note  09/27/2020 Name: Garrett Vargas MRN: [id number] DOB: [birth year]  Referred by: Caren Macadam, MD Reason for referral : No chief complaint on file.   Third unsuccessful telephone outreach was attempted today. The patient was referred to the pharmacist for assistance with care management and care coordination.   Follow Up Plan:   Carley Perdue [employer] Scheduler

## 2020-09-28 DIAGNOSIS — M720 Palmar fascial fibromatosis [Dupuytren]: Secondary | ICD-10-CM | POA: Diagnosis not present

## 2020-09-28 DIAGNOSIS — M1811 Unilateral primary osteoarthritis of first carpometacarpal joint, right hand: Secondary | ICD-10-CM | POA: Diagnosis not present

## 2020-09-28 DIAGNOSIS — M19041 Primary osteoarthritis, right hand: Secondary | ICD-10-CM | POA: Diagnosis not present

## 2020-09-28 DIAGNOSIS — M65341 Trigger finger, right ring finger: Secondary | ICD-10-CM | POA: Diagnosis not present

## 2020-09-28 NOTE — Progress Notes (Signed)
[facility] Summary Report / Loop Recorder 

## 2020-10-02 ENCOUNTER — Ambulatory Visit: Payer: Medicare Other

## 2020-10-04 ENCOUNTER — Other Ambulatory Visit: Payer: Self-pay

## 2020-10-05 ENCOUNTER — Encounter: Payer: Self-pay | Admitting: Family Medicine

## 2020-10-05 ENCOUNTER — Ambulatory Visit (INDEPENDENT_AMBULATORY_CARE_PROVIDER_SITE_OTHER): Payer: Medicare Other | Admitting: Family Medicine

## 2020-10-05 VITALS — BP 102/62 | HR 72 | Ht 74.0 in | Wt 186.9 lb

## 2020-10-05 DIAGNOSIS — J3089 Other allergic rhinitis: Secondary | ICD-10-CM | POA: Diagnosis not present

## 2020-10-05 DIAGNOSIS — E7800 Pure hypercholesterolemia, unspecified: Secondary | ICD-10-CM

## 2020-10-05 DIAGNOSIS — I480 Paroxysmal atrial fibrillation: Secondary | ICD-10-CM

## 2020-10-05 DIAGNOSIS — I10 Essential (primary) hypertension: Secondary | ICD-10-CM | POA: Diagnosis not present

## 2020-10-05 NOTE — Progress Notes (Signed)
Garrett Vargas DOB: 1945/12/26 Encounter date: 10/05/2020  This is a 75 y.o. male who presents with Chief Complaint  Patient presents with   Follow-up    History of present illness: Last visit with cardiology was 07/2020.  They are keeping him on low-dose flecainide for paroxysmal atrial fibrillation although he has been maintaining normal sinus rhythm and they noted no QRS prolongation.  Also on Xarelto.  Hypertension: Atenolol 25 mg daily - no issues with light headedness/dizziness. Usually at home he states more 115-130/70-80.   Hyperlipidemia: Niacin, omega-3, Lipitor 20 mg daily  Allergies: seasonal allergies haven't kicked in yet. Son got him nasal spray - just had for a week. Thinks atrovent.   Acid reflux: On omeprazole Follows with Dr. Pearline Cables for derm.  Follows with Dr. Quay Burow for eyes.  Was due for colonoscopy in October/2021 - he states that it is getting set up; he had to reschedule. Will be seeing GI first.    No Known Allergies Current Meds  Medication Sig   atenolol (TENORMIN) 25 MG tablet TAKE ONE-HALF TABLET BY  MOUTH DAILY. MAY TAKE AN  EXTRA ONE-HALF TABLET DAILY FOR PALPITATIONS OR HEART  RACING. (Patient taking differently: Take 25 mg by mouth daily.)   atorvastatin (LIPITOR) 20 MG tablet TAKE 1 TABLET BY MOUTH  DAILY   flecainide (TAMBOCOR) 50 MG tablet TAKE 1 TABLET BY MOUTH  TWICE DAILY   Multiple Vitamins-Minerals (CENTRUM SILVER PO) Take 1 capsule by mouth daily.   niacin 500 MG tablet Take 500 mg by mouth at bedtime.   Omega-3 Fatty Acids (FISH OIL PO) Take 1 capsule by mouth daily.    omeprazole (PRILOSEC) 20 MG capsule Take 20 mg by mouth daily.   tadalafil (CIALIS) 5 MG tablet Take 5 mg by mouth daily as needed for erectile dysfunction.   XARELTO 20 MG TABS tablet TAKE 1 TABLET BY MOUTH  DAILY WITH SUPPER    Review of Systems  Constitutional: Negative for chills, fatigue and fever.  Respiratory: Negative for cough, chest tightness,  shortness of breath and wheezing.   Cardiovascular: Negative for chest pain, palpitations and leg swelling.    Objective:  BP 102/62 (BP Location: Left Arm, Patient Position: Sitting, Cuff Size: Normal)    Pulse 72    Ht 6\' 2"  (1.88 m)    Wt 186 lb 14.4 oz (84.8 kg)    SpO2 96%    BMI 24.00 kg/m   Weight: 186 lb 14.4 oz (84.8 kg)   BP Readings from Last 3 Encounters:  10/05/20 102/62  07/09/20 116/68  03/14/20 108/80   Wt Readings from Last 3 Encounters:  10/05/20 186 lb 14.4 oz (84.8 kg)  07/09/20 185 lb 9.6 oz (84.2 kg)  03/14/20 182 lb 11.2 oz (82.9 kg)    Physical Exam Constitutional:      General: He is not in acute distress.    Appearance: He is well-developed.  Cardiovascular:     Rate and Rhythm: Normal rate and regular rhythm.     Heart sounds: Normal heart sounds. No murmur heard. No friction rub.  Pulmonary:     Effort: Pulmonary effort is normal. No respiratory distress.     Breath sounds: Normal breath sounds. No wheezing or rales.  Musculoskeletal:     Right lower leg: No edema.     Left lower leg: No edema.  Neurological:     Mental Status: He is alert and oriented to person, place, and time.  Psychiatric:  Behavior: Behavior normal.     Assessment/Plan  1. Essential hypertension, benign Has been well controlled on atenolol 5 mg daily.  2. Paroxysmal atrial fibrillation (HCC) Flecainide 50 mg twice daily milligrams daily, Xarelto 20 mg daily.  3. Pure hypercholesterolemia Continue Lipitor 20 mg daily.  4. Non-seasonal allergic rhinitis, unspecified trigger OTC antihistamine as needed.  He will complete blood work today (previously ordered).  He has follow-up colonoscopy scheduled already.  Return in about 6 months (around 04/07/2021) for physical exam.    Micheline Rough, MD

## 2020-10-16 ENCOUNTER — Ambulatory Visit: Payer: Medicare Other

## 2020-10-27 LAB — CUP PACEART REMOTE DEVICE CHECK
Date Time Interrogation Session: 20220319235337
Implantable Pulse Generator Implant Date: 20190909

## 2020-10-29 ENCOUNTER — Ambulatory Visit (INDEPENDENT_AMBULATORY_CARE_PROVIDER_SITE_OTHER): Payer: Medicare Other

## 2020-10-29 DIAGNOSIS — R55 Syncope and collapse: Secondary | ICD-10-CM | POA: Diagnosis not present

## 2020-11-02 ENCOUNTER — Ambulatory Visit: Payer: Medicare Other

## 2020-11-02 DIAGNOSIS — K635 Polyp of colon: Secondary | ICD-10-CM | POA: Diagnosis not present

## 2020-11-02 DIAGNOSIS — M65341 Trigger finger, right ring finger: Secondary | ICD-10-CM | POA: Diagnosis not present

## 2020-11-05 ENCOUNTER — Telehealth: Payer: Self-pay

## 2020-11-05 NOTE — Telephone Encounter (Signed)
-----   Message from Irving Copas., MD sent at 11/05/2020  3:35 PM EDT ----- Garrett Vargas, This patient is being referred by Dr. 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  Dr. Watt Climes has already spoken with the patient and I am okay with moving forward with a direct procedure 90-minute colon with EMR.  If the patient would like to see me or discussed with me let us know and then please set up a clinic visit should he desire (okay to use 930/1050/230/350 p.m. slot if necessary).  This case can occur anytime between April/May/June as per patient convenience. Please keep Dr. Watt Climes up-to-date.   Thanks. 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

## 2020-11-05 NOTE — Telephone Encounter (Signed)
Thank you for letting me know referral/records have come through. I will review the physical notes when able. RN Gerarda Fraction will already be moving forward with getting patient set up for procedure. Thank you. GM

## 2020-11-05 NOTE — Telephone Encounter (Signed)
Hi Dr. [name], we received records from Dr. Watt Climes pertaining this referral. I will send you those records. Thank you.

## 2020-11-06 ENCOUNTER — Telehealth: Payer: Self-pay | Admitting: *Deleted

## 2020-11-06 DIAGNOSIS — I700 Atherosclerosis of aorta: Secondary | ICD-10-CM

## 2020-11-06 NOTE — Telephone Encounter (Signed)
Left message if Dr. Perley Jain surgery scheduler would please fax over a clearance request. Dr. Watt Climes faxed over his office note statin he needs cardiac clearance for procedure for the pt.

## 2020-11-07 ENCOUNTER — Other Ambulatory Visit: Payer: Self-pay | Admitting: Family Medicine

## 2020-11-07 DIAGNOSIS — I10 Essential (primary) hypertension: Secondary | ICD-10-CM

## 2020-11-07 DIAGNOSIS — E7800 Pure hypercholesterolemia, unspecified: Secondary | ICD-10-CM

## 2020-11-07 NOTE — Progress Notes (Signed)
[facility] Summary Report / Loop Recorder 

## 2020-11-09 NOTE — Telephone Encounter (Signed)
Clinical pharmacist to review Xarelto 

## 2020-11-09 NOTE — Progress Notes (Signed)
Subjective:   Garrett Vargas is a 75 y.o. male who presents for Medicare Annual/Subsequent preventive examination.  Review of Systems    n/a Cardiac Risk Factors include: advanced age (>31men, >6 women);male gender;dyslipidemia     Objective:    Today's Vitals   11/12/20 0908  BP: 122/72  Pulse: (!) 59  Temp: 97.8 F (36.6 C)  SpO2: 96%  Weight: 186 lb (84.4 kg)  Height: 6\' 2"  (1.88 m)   Body mass index is 23.88 kg/m.  Advanced Directives 11/12/2020 09/05/2019 10/11/2018 07/05/2018 06/23/2017 07/18/2015 07/18/2015  Does Patient Have a Medical Advance Directive? Yes Yes No Yes Yes Yes -  Type of Advance Directive - [place];Living will - - - Living will;Healthcare Power of Attorney -  Does patient want to make changes to medical advance directive? No - Patient declined No - Patient declined - - - No - Patient declined -  Copy of [place] in Chart? - No - copy requested - - - No - copy requested (No Data)  Would patient like information on creating a medical advance directive? - - No - Patient declined - - - -    Current Medications (verified) Outpatient Encounter Medications as of 11/12/2020  Medication Sig  . atenolol (TENORMIN) 25 MG tablet TAKE ONE-HALF TABLET BY  MOUTH DAILY. MAY TAKE AN  EXTRA ONE-HALF TABLET DAILY FOR PALPITATIONS OR HEART  RACING. (Patient taking differently: Take 25 mg by mouth daily.)  . atorvastatin (LIPITOR) 20 MG tablet TAKE 1 TABLET BY MOUTH  DAILY  . flecainide (TAMBOCOR) 50 MG tablet TAKE 1 TABLET BY MOUTH  TWICE DAILY  . Multiple Vitamins-Minerals (CENTRUM SILVER PO) Take 1 capsule by mouth daily.  . niacin 500 MG tablet Take 500 mg by mouth at bedtime.  . Omega-3 Fatty Acids (FISH OIL PO) Take 1 capsule by mouth daily.   Marland Kitchen omeprazole (PRILOSEC) 20 MG capsule Take 20 mg by mouth daily.  . tadalafil (CIALIS) 5 MG tablet Take 5 mg by mouth daily as needed for erectile dysfunction.  Alveda Reasons 20 MG TABS  tablet TAKE 1 TABLET BY MOUTH  DAILY WITH SUPPER   No facility-administered encounter medications on file as of 11/12/2020.    Allergies (verified) Patient has no known allergies.   History: Past Medical History:  Diagnosis Date  . A-fib ([place])   . ALLERGIC RHINITIS   . Arthritis   . Colon polyp   . Dysrhythmia   . Erectile dysfunction   . History of bronchitis as a child   . Hyperlipemia   . Hypertension   . S/P lateral meniscectomy of left knee    Past Surgical History:  Procedure Laterality Date  . CARDIOVERSION N/A 03/17/2014   Procedure: CARDIOVERSION;  Surgeon: Sanda Klein, MD;  Location: [facility];  Service: Cardiovascular;  Laterality: N/A;  . colonscopy     . ELBOW ARTHROSCOPY Bilateral   . LOOP RECORDER INSERTION N/A 04/12/2018   Procedure: LOOP RECORDER INSERTION;  Surgeon: Evans Lance, MD;  Location: [facility];  Service: Cardiovascular;  Laterality: N/A;  . Multiple Knee,elbow and foot surgeries    . neuroma left foot,2010, Dr. Anne Fu    . TEE WITHOUT CARDIOVERSION N/A 03/17/2014   Procedure: TRANSESOPHAGEAL ECHOCARDIOGRAM (TEE);  Surgeon: Sanda Klein, MD;  Location: [facility];  Service: Cardiovascular;  Laterality: N/A;  . TOTAL HIP ARTHROPLASTY Left 07/18/2015   Procedure: LEFT TOTAL HIP ARTHROPLASTY ANTERIOR APPROACH;  Surgeon: Gaynelle Arabian, MD;  Location: [facility];  Service: Orthopedics;  Laterality: Left;   Family History  Problem Relation Age of Onset  . Coronary artery disease Other   . Prostate cancer Other   . Stroke Other   . Heart disease Mother   . Stroke Mother 76  . Prostate cancer Father 59  . Kidney disease Sister    Social History   Socioeconomic History  . Marital status: Married    Spouse name: Not on file  . Number of children: 3  . Years of education: Not on file  . Highest education level: Professional school degree (e.g., MD, DDS, DVM, JD)  Occupational History  . Occupation: retired Chief Financial Officer   . Occupation: former smoker  . Occupation: alocohol use-yes  . Occupation: Drug use-no  . Occupation: regular exercise-yes  Tobacco Use  . Smoking status: Former Smoker    Packs/day: 0.50    Years: 10.00    Pack years: 5.00    Types: Cigarettes    Quit date: 08/05/1987    Years since quitting: 33.2  . Smokeless tobacco: Never Used  Vaping Use  . Vaping Use: Never used  Substance and Sexual Activity  . Alcohol use: Yes    Alcohol/week: 0.0 standard drinks    Comment: occasional  . Drug use: No  . Sexual activity: Not on file  Other Topics Concern  . Not on file  Social History Narrative  . Not on file   Social Determinants of Health   Financial Resource Strain: Low Risk   . Difficulty of Paying Living Expenses: Not hard at all  Food Insecurity: No Food Insecurity  . Worried About Charity fundraiser in the Last Year: Never true  . Ran Out of Food in the Last Year: Never true  Transportation Needs: No Transportation Needs  . Lack of Transportation (Medical): No  . Lack of Transportation (Non-Medical): No  Physical Activity: Sufficiently Active  . Days of Exercise per Week: 5 days  . Minutes of Exercise per Session: 30 min  Stress: No Stress Concern Present  . Feeling of Stress : Not at all  Social Connections: Moderately Integrated  . Frequency of Communication with Friends and Family: More than three times a week  . Frequency of Social Gatherings with Friends and Family: More than three times a week  . Attends Religious Services: Never  . Active Member of Clubs or Organizations: Yes  . Attends Archivist Meetings: More than 4 times per year  . Marital Status: Married    Tobacco Counseling Counseling given: Not Answered   Clinical Intake:  Pre-visit preparation completed: Yes  Pain : No/denies pain     Nutritional Risks: None Diabetes: No  How often do you need to have someone help you when you read instructions, pamphlets, or other written  materials from your doctor or pharmacy?: 1 - Never What is the last grade level you completed in school?: medical school  Diabetic?no  Interpreter Needed?: No  Information entered by :: L.Taran Hable,LPN   Activities of Daily Living In your present state of health, do you have any difficulty performing the following activities: 11/12/2020  Hearing? N  Vision? N  Difficulty concentrating or making decisions? N  Walking or climbing stairs? N  Dressing or bathing? N  Doing errands, shopping? N  Preparing Food and eating ? N  Using the Toilet? N  In the past six months, have you accidently leaked urine? N  Do you have problems with loss of  bowel control? N  Managing your Medications? N  Managing your Finances? N  Housekeeping or managing your Housekeeping? N  Some recent data might be hidden    Patient Care Team: Caren Macadam, MD as PCP - General (Family Medicine) Macario Carls, MD as Referring Physician (Dermatology) Viona Gilmore, [facility] as Pharmacist (Pharmacist)  Indicate any recent Medical Services you may have received from other than [facility] providers in the past year (date may be approximate).     Assessment:   This is a routine wellness examination for [place].  Hearing/Vision screen  Hearing Screening   125Hz  250Hz  500Hz  1000Hz  2000Hz  3000Hz  4000Hz  6000Hz  8000Hz   Right ear:           Left ear:           Vision Screening Comments: Annual eye exam wears glasses  Dietary issues and exercise activities discussed: Current Exercise Habits: Home exercise routine, Type of exercise: walking, Time (Minutes): 30, Frequency (Times/Week): 5, Weekly Exercise (Minutes/Week): 150, Intensity: Mild, Exercise limited by: None identified  Goals    . Exercise 3x per week (30 min per time)     Be able to walk 18 holes for golf     . patient     To maintain your health and exercise  Take time to travel and enjoy your time when pandemic settles        Depression Screen PHQ 2/9  Scores 11/12/2020 03/14/2020 09/05/2019 01/17/2019 07/05/2018 06/23/2017 05/09/2015  PHQ - 2 Score 0 0 0 0 0 0 0    Fall Risk Fall Risk  11/12/2020 03/14/2020 09/05/2019 01/17/2019 07/05/2018  Falls in the past year? 0 0 0 0 0  Number falls in past yr: 0 0 - 0 -  Injury with Fall? 0 0 - - -  Risk for fall due to : - - Medication side effect - -  Follow up - - Falls evaluation completed;Education provided;Falls prevention discussed - -    FALL RISK PREVENTION PERTAINING TO THE HOME:  Any stairs in or around the home? Yes  If so, are there any without handrails? Yes  Home free of loose throw rugs in walkways, pet beds, electrical cords, etc? Yes  Adequate lighting in your home to reduce risk of falls? Yes   ASSISTIVE DEVICES UTILIZED TO PREVENT FALLS:  Life alert? No  Use of a cane, walker or w/c? No  Grab bars in the bathroom? Yes  Shower chair or bench in shower? Yes  Elevated toilet seat or a handicapped toilet? Yes   TIMED UP AND GO:  Was the test performed? Yes .  Length of time to ambulate 10 feet: 6 sec.   Gait steady and fast without use of assistive device  Cognitive Function: Normal cognitive status assessed by direct observation by this Nurse Health Advisor. No abnormalities found.   MMSE - Mini Mental State Exam 07/05/2018  Not completed: (No Data)     6CIT Screen 09/05/2019  What Year? 0 points  What month? 0 points  What time? 0 points  Count back from 20 0 points  Months in reverse 0 points  Repeat phrase 0 points  Total Score 0    Immunizations Immunization History  Administered Date(s) Administered  . Hepatitis A 07/02/2012, 01/10/2013  . Influenza Nasal 05/04/2017  . Influenza Split 04/27/2012  . Influenza Whole 05/04/2009  . Influenza, High Dose Seasonal PF 06/04/2018  . Influenza,inj,Quad PF,6+ Mos 05/05/2019  . Influenza-Unspecified 05/04/2014, 05/07/2015, 04/28/2016, 05/04/2020  .  PFIZER(Purple Top)SARS-COV-2 Vaccination 08/17/2019, 09/05/2019,  04/12/2020  . PPD Test 04/16/2011, 04/27/2012, 04/27/2013, 04/11/2014  . Pneumococcal Conjugate-13 04/06/2014  . Pneumococcal Polysaccharide-23 12/10/2010  . Td 01/08/2005  . Tdap 05/09/2015  . Zoster 08/13/2009  . Zoster Recombinat (Shingrix) 09/02/2018, 02/23/2019    TDAP status: Up to date  Flu Vaccine status: Up to date  Pneumococcal vaccine status: Up to date  Covid-19 vaccine status: Completed vaccines  Qualifies for Shingles Vaccine? Yes   Zostavax completed No   Shingrix Completed?: No.    Education has been provided regarding the importance of this vaccine. Patient has been advised to call insurance company to determine out of pocket expense if they have not yet received this vaccine. Advised may also receive vaccine at local pharmacy or Health Dept. Verbalized acceptance and understanding.  Screening Tests Health Maintenance  Topic Date Due  . COVID-19 Vaccine (4 - Booster for Pfizer series) 10/10/2020  . INFLUENZA VACCINE  03/04/2021  . COLONOSCOPY (Pts 45-65yrs Insurance coverage will need to be confirmed)  05/21/2022  . TETANUS/TDAP  05/08/2025  . Hepatitis C Screening  Completed  . PNA vac Low Risk Adult  Completed  . HPV VACCINES  Aged Out    Health Maintenance  Health Maintenance Due  Topic Date Due  . COVID-19 Vaccine (4 - Booster for Pfizer series) 10/10/2020    Colorectal cancer screening: Type of screening: Colonoscopy. Completed 05/21/2017. Repeat every 5 years  Lung Cancer Screening: (Low Dose CT Chest recommended if Age 85-80 years, 30 pack-year currently smoking OR have quit w/in 15years.) does not qualify.   Lung Cancer Screening Referral: n/a  Additional Screening:  Hepatitis C Screening: does qualify; Completed   Vision Screening: Recommended annual ophthalmology exams for early detection of glaucoma and other disorders of the eye. Is the patient up to date with their annual eye exam?  Yes  Who is the provider or what is the name of the  office in which the patient attends annual eye exams? [facility] If pt is not established with a provider, would they like to be referred to a provider to establish care? No .   Dental Screening: Recommended annual dental exams for proper oral hygiene  Community Resource Referral / Chronic Care Management: CRR required this visit?  No   CCM required this visit?  No      Plan:     I have personally reviewed and noted the following in the patient's chart:   . Medical and social history . Use of alcohol, tobacco or illicit drugs  . Current medications and supplements . Functional ability and status . Nutritional status . Physical activity . Advanced directives . List of other physicians . Hospitalizations, surgeries, and ER visits in previous 12 months . Vitals . Screenings to include cognitive, depression, and falls . Referrals and appointments  In addition, I have reviewed and discussed with patient certain preventive protocols, quality metrics, and best practice recommendations. A written personalized care plan for preventive services as well as general preventive health recommendations were provided to patient.     [name], LPN   5/63/1497   Nurse Notes: none

## 2020-11-09 NOTE — Telephone Encounter (Signed)
   [facility] Pre-operative Risk Assessment    Patient Name: Garrett Vargas  DOB: April 25, 1946  MRN: [id number]   HEARTCARE STAFF: - Please ensure there is not already an duplicate clearance open for this procedure. - Under Visit Info/Reason for Call, type in Other and utilize the format Clearance MM/DD/YY or Clearance TBD. Do not use dashes or single digits. - If request is for dental extraction, please clarify the # of teeth to be extracted.  Request for surgical clearance:  1. What type of surgery is being performed? COLONOSCOPY   2. When is this surgery scheduled? TBD   3. What type of clearance is required (medical clearance vs. Pharmacy clearance to hold med vs. Both)? BOTH  4. Are there any medications that need to be held prior to surgery and how long? [place]    5. Practice name and name of physician performing surgery? [facility]; DR. MAGOD   6. What is the office phone number? [phone]   7.   What is the office fax number? [phone]  8.   Anesthesia type (None, local, MAC, general) ? NOT LISTED   Julaine Hua 11/09/2020, 4:44 PM  _________________________________________________________________   (provider comments below)

## 2020-11-12 ENCOUNTER — Other Ambulatory Visit: Payer: Self-pay

## 2020-11-12 ENCOUNTER — Telehealth: Payer: Self-pay | Admitting: Pharmacist

## 2020-11-12 ENCOUNTER — Ambulatory Visit (INDEPENDENT_AMBULATORY_CARE_PROVIDER_SITE_OTHER): Payer: Medicare Other

## 2020-11-12 VITALS — BP 122/72 | HR 59 | Temp 97.8°F | Ht 74.0 in | Wt 186.0 lb

## 2020-11-12 DIAGNOSIS — Z0000 Encounter for general adult medical examination without abnormal findings: Secondary | ICD-10-CM | POA: Diagnosis not present

## 2020-11-12 DIAGNOSIS — Z86010 Personal history of colonic polyps: Secondary | ICD-10-CM

## 2020-11-12 DIAGNOSIS — I700 Atherosclerosis of aorta: Secondary | ICD-10-CM | POA: Insufficient documentation

## 2020-11-12 NOTE — Telephone Encounter (Signed)
Left message on machine to call back  

## 2020-11-12 NOTE — Progress Notes (Signed)
Attempted to obtain medical history via telephone, unable to reach at this time. I left a voicemail to return pre surgical testing department's phone call.  

## 2020-11-12 NOTE — Telephone Encounter (Signed)
Patient with diagnosis of afib on Xarelto for anticoagulation.     Procedure: colonoscopy Date of procedure: TBD   CHA2DS2-VASc Score = 4  This indicates a 4.8% annual risk of stroke. The patient's score is based upon: CHF History: No HTN History: Yes Diabetes History: No Stroke History: No Vascular Disease History: Yes (aortic atherosclerosis seen on 10/11/2018 chest scan) Age Score: 2 Gender Score: 0   CrCl 66mL/min Platelet count 223K   Per office protocol, patient can hold Xarelto for 1-2 days prior to procedure.

## 2020-11-12 NOTE — Patient Instructions (Addendum)
Garrett Vargas , Thank you for taking time to come for your Medicare Wellness Visit. I appreciate your ongoing commitment to your health goals. Please review the following plan we discussed and let me know if I can assist you in the future.   Screening recommendations/referrals: Colonoscopy: Due- Patient is schedule soon in contact with Dr. Watt Climes Recommended yearly ophthalmology/optometry visit for glaucoma screening and checkup Recommended yearly dental visit for hygiene and checkup  Vaccinations: Influenza vaccine: Current Due this Fall Pneumococcal vaccine: Completed the series  Tdap vaccine: Current Due 10/05//2026 Shingles vaccine: Current completed Shingrix  Advanced directives: will provide copies   Conditions/risks identified: None   Next appointment: 11/14/2020 @330  with Pharmacy   Preventive Care 75 Years and Older, Male Preventive care refers to lifestyle choices and visits with your health care provider that can promote health and wellness. What does preventive care include?  A yearly physical exam. This is also called an annual well check.  Dental exams once or twice a year.  Routine eye exams. Ask your health care provider how often you should have your eyes checked.  Personal lifestyle choices, including:  Daily care of your teeth and gums.  Regular physical activity.  Eating a healthy diet.  Avoiding tobacco and drug use.  Limiting alcohol use.  Practicing safe sex.  Taking low doses of aspirin every day.  Taking vitamin and mineral supplements as recommended by your health care provider. What happens during an annual well check? The services and screenings done by your health care provider during your annual well check will depend on your age, overall health, lifestyle risk factors, and family history of disease. Counseling  Your health care provider may ask you questions about your:  Alcohol use.  Tobacco use.  Drug use.  Emotional  well-being.  Home and relationship well-being.  Sexual activity.  Eating habits.  History of falls.  Memory and ability to understand (cognition).  Work and work Statistician. Screening  You may have the following tests or measurements:  Height, weight, and BMI.  Blood pressure.  Lipid and cholesterol levels. These may be checked every 5 years, or more frequently if you are over 75 years old.  Skin check.  Lung cancer screening. You may have this screening every year starting at age 75 if you have a 30-pack-year history of smoking and currently smoke or have quit within the past 15 years.  Fecal occult blood test (FOBT) of the stool. You may have this test every year starting at age 75.  Flexible sigmoidoscopy or colonoscopy. You may have a sigmoidoscopy every 5 years or a colonoscopy every 10 years starting at age 75.  Prostate cancer screening. Recommendations will vary depending on your family history and other risks.  Hepatitis C blood test.  Hepatitis B blood test.  Sexually transmitted disease (STD) testing.  Diabetes screening. This is done by checking your blood sugar (glucose) after you have not eaten for a while (fasting). You may have this done every 1-3 years.  Abdominal aortic aneurysm (AAA) screening. You may need this if you are a current or former smoker.  Osteoporosis. You may be screened starting at age 75 if you are at high risk. Talk with your health care provider about your test results, treatment options, and if necessary, the need for more tests. Vaccines  Your health care provider may recommend certain vaccines, such as:  Influenza vaccine. This is recommended every year.  Tetanus, diphtheria, and acellular pertussis (Tdap, Td) vaccine. You may  need a Td booster every 10 years.  Zoster vaccine. You may need this after age 75.  Pneumococcal 13-valent conjugate (PCV13) vaccine. One dose is recommended after age 75.  Pneumococcal  polysaccharide (PPSV23) vaccine. One dose is recommended after age 75. Talk to your health care provider about which screenings and vaccines you need and how often you need them. This information is not intended to replace advice given to you by your health care provider. Make sure you discuss any questions you have with your health care provider. Document Released: 08/17/2015 Document Revised: 04/09/2016 Document Reviewed: 05/22/2015 Elsevier Interactive Patient Education  2017 [place] Prevention in the Home Falls can cause injuries. They can happen to people of all ages. There are many things you can do to make your home safe and to help prevent falls. What can I do on the outside of my home?  Regularly fix the edges of walkways and driveways and fix any cracks.  Remove anything that might make you trip as you walk through a door, such as a raised step or threshold.  Trim any bushes or trees on the path to your home.  Use bright outdoor lighting.  Clear any walking paths of anything that might make someone trip, such as rocks or tools.  Regularly check to see if handrails are loose or broken. Make sure that both sides of any steps have handrails.  Any raised decks and porches should have guardrails on the edges.  Have any leaves, snow, or ice cleared regularly.  Use sand or salt on walking paths during winter.  Clean up any spills in your garage right away. This includes oil or grease spills. What can I do in the bathroom?  Use night lights.  Install grab bars by the toilet and in the tub and shower. Do not use towel bars as grab bars.  Use non-skid mats or decals in the tub or shower.  If you need to sit down in the shower, use a plastic, non-slip stool.  Keep the floor dry. Clean up any water that spills on the floor as soon as it happens.  Remove soap buildup in the tub or shower regularly.  Attach bath mats securely with double-sided non-slip rug  tape.  Do not have throw rugs and other things on the floor that can make you trip. What can I do in the bedroom?  Use night lights.  Make sure that you have a light by your bed that is easy to reach.  Do not use any sheets or blankets that are too big for your bed. They should not hang down onto the floor.  Have a firm chair that has side arms. You can use this for support while you get dressed.  Do not have throw rugs and other things on the floor that can make you trip. What can I do in the kitchen?  Clean up any spills right away.  Avoid walking on wet floors.  Keep items that you use a lot in easy-to-reach places.  If you need to reach something above you, use a strong step stool that has a grab bar.  Keep electrical cords out of the way.  Do not use floor polish or wax that makes floors slippery. If you must use wax, use non-skid floor wax.  Do not have throw rugs and other things on the floor that can make you trip. What can I do with my stairs?  Do not leave any items on  the stairs.  Make sure that there are handrails on both sides of the stairs and use them. Fix handrails that are broken or loose. Make sure that handrails are as long as the stairways.  Check any carpeting to make sure that it is firmly attached to the stairs. Fix any carpet that is loose or worn.  Avoid having throw rugs at the top or bottom of the stairs. If you do have throw rugs, attach them to the floor with carpet tape.  Make sure that you have a light switch at the top of the stairs and the bottom of the stairs. If you do not have them, ask someone to add them for you. What else can I do to help prevent falls?  Wear shoes that:  Do not have high heels.  Have rubber bottoms.  Are comfortable and fit you well.  Are closed at the toe. Do not wear sandals.  If you use a stepladder:  Make sure that it is fully opened. Do not climb a closed stepladder.  Make sure that both sides of the  stepladder are locked into place.  Ask someone to hold it for you, if possible.  Clearly mark and make sure that you can see:  Any grab bars or handrails.  First and last steps.  Where the edge of each step is.  Use tools that help you move around (mobility aids) if they are needed. These include:  Canes.  Walkers.  Scooters.  Crutches.  Turn on the lights when you go into a dark area. Replace any light bulbs as soon as they burn out.  Set up your furniture so you have a clear path. Avoid moving your furniture around.  If any of your floors are uneven, fix them.  If there are any pets around you, be aware of where they are.  Review your medicines with your doctor. Some medicines can make you feel dizzy. This can increase your chance of falling. Ask your doctor what other things that you can do to help prevent falls. This information is not intended to replace advice given to you by your health care provider. Make sure you discuss any questions you have with your health care provider. Document Released: 05/17/2009 Document Revised: 12/27/2015 Document Reviewed: 08/25/2014 Elsevier Interactive Patient Education  2017 [employer].

## 2020-11-12 NOTE — Chronic Care Management (AMB) (Addendum)
    Chronic Care Management Pharmacy Assistant   Name: Garrett Vargas  MRN: [id number] DOB: [birth year]  Reason for Encounter: Initial Questions for Pharmacist visit on 11-14-2020  Patient Questions:   Have you seen any other providers since your last visit? No Any changes in your medications or health? No Any side effects from any medications? No Do you have an symptoms or problems not managed by your medications? No Any concerns about your health right now? No Has your provider asked that you check blood pressure, blood sugar, or follow special diet at home?  Sometimes takes blood pressure Do you get any type of exercise on a regular basis? Healthy club 2 days a week for 40 minutes Plays golf 2-3 days a week Can you think of a goal you would like to reach for your health?  Maintain health Do you have any problems getting your medications? No Is there anything that you would like to discuss during the appointment? No  The patient was asked to please bring medications, blood pressure/ blood sugar log, and supplements to his appointment.  Medications: Outpatient Encounter Medications as of 11/12/2020  Medication Sig   atenolol (TENORMIN) 25 MG tablet TAKE ONE-HALF TABLET BY  MOUTH DAILY. MAY TAKE AN  EXTRA ONE-HALF TABLET DAILY FOR PALPITATIONS OR HEART  RACING. (Patient taking differently: Take 25 mg by mouth daily.)   atorvastatin (LIPITOR) 20 MG tablet TAKE 1 TABLET BY MOUTH  DAILY   flecainide (TAMBOCOR) 50 MG tablet TAKE 1 TABLET BY MOUTH  TWICE DAILY   Multiple Vitamins-Minerals (CENTRUM SILVER PO) Take 1 capsule by mouth daily.   niacin 500 MG tablet Take 500 mg by mouth at bedtime.   Omega-3 Fatty Acids (FISH OIL PO) Take 1 capsule by mouth daily.    omeprazole (PRILOSEC) 20 MG capsule Take 20 mg by mouth daily.   tadalafil (CIALIS) 5 MG tablet Take 5 mg by mouth daily as needed for erectile dysfunction.   XARELTO 20 MG TABS tablet TAKE 1 TABLET BY MOUTH  DAILY WITH SUPPER    No facility-administered encounter medications on file as of 11/12/2020.    Star Rating Drugs:  Dispensed Quantity Pharmacy  Atorvastatin 02.01.2022 90 [facility]   Arlis Porta Revonda Standard, [place] [phone]

## 2020-11-12 NOTE — Telephone Encounter (Signed)
Colon EMR scheduled for 11/19/20 at 1245 pm at [place] test on 11/15/20

## 2020-11-12 NOTE — Telephone Encounter (Signed)
Attempted to call patient, no answer 

## 2020-11-12 NOTE — Telephone Encounter (Signed)
Patient with diagnosis of afib on Xarelto for anticoagulation.    Procedure: colonoscopy Date of procedure: TBD  CHA2DS2-VASc Score = 4  This indicates a 4.8% annual risk of stroke. The patient's score is based upon: CHF History: No HTN History: Yes Diabetes History: No Stroke History: No Vascular Disease History: Yes (aortic atherosclerosis seen on 10/11/2018 chest scan) Age Score: 2 Gender Score: 0  CrCl 59mL/min Platelet count 223K  Per office protocol, patient can hold Xarelto for 1-2 days prior to procedure.

## 2020-11-13 ENCOUNTER — Other Ambulatory Visit: Payer: Self-pay

## 2020-11-13 MED ORDER — PEG 3350-KCL-NA BICARB-NACL 420 G PO SOLR: 4000.0000 mL | Freq: Once | ORAL | 0 refills | Status: AC

## 2020-11-13 NOTE — Telephone Encounter (Signed)
Colon  scheduled, pt instructed and medications reviewed.  Patient instructions available in My Chart.   Patient to call with any questions or concerns.

## 2020-11-14 ENCOUNTER — Ambulatory Visit (INDEPENDENT_AMBULATORY_CARE_PROVIDER_SITE_OTHER): Payer: Medicare Other | Admitting: Pharmacist

## 2020-11-14 ENCOUNTER — Other Ambulatory Visit: Payer: Self-pay

## 2020-11-14 DIAGNOSIS — N401 Enlarged prostate with lower urinary tract symptoms: Secondary | ICD-10-CM

## 2020-11-14 DIAGNOSIS — I480 Paroxysmal atrial fibrillation: Secondary | ICD-10-CM

## 2020-11-14 DIAGNOSIS — I10 Essential (primary) hypertension: Secondary | ICD-10-CM | POA: Diagnosis not present

## 2020-11-14 DIAGNOSIS — R351 Nocturia: Secondary | ICD-10-CM | POA: Diagnosis not present

## 2020-11-14 NOTE — Progress Notes (Signed)
Chronic Care Management Pharmacy Note  11/29/2020 Name:  Garrett Vargas MRN:  [id number] DOB:  [birth year]  Subjective: Garrett Vargas is an 75 y.o. year old male who is a primary patient of Koberlein, Steele Berg, MD.  The CCM team was consulted for assistance with disease management and care coordination needs.    Engaged with patient by telephone for initial visit in response to provider referral for pharmacy case management and/or care coordination services.   Consent to Services:  The patient was given the following information about Chronic Care Management services today, agreed to services, and gave verbal consent: 1. CCM service includes personalized support from designated clinical staff supervised by the primary care provider, including individualized plan of care and coordination with other care providers 2. 24/7 contact phone numbers for assistance for urgent and routine care needs. 3. Service will only be billed when office clinical staff spend 20 minutes or more in a month to coordinate care. 4. Only one practitioner may furnish and bill the service in a calendar month. 5.The patient may stop CCM services at any time (effective at the end of the month) by phone call to the office staff. 6. The patient will be responsible for cost sharing (co-pay) of up to 20% of the service fee (after annual deductible is met). Patient agreed to services and consent obtained.  Patient Care Team: Caren Macadam, MD as PCP - General (Family Medicine) Macario Carls, MD as Referring Physician (Dermatology) Viona Gilmore, [facility] as Pharmacist (Pharmacist)  Recent office visits: 11/12/20 Garrett Pigg, LPN: Patient presented for AWV.  10/05/20 Garrett Rough, MD: Patient presented for chronic conditions follow up.   Recent consult visits: 11/02/20 Daryll Brod, MD (ortho): Patient presented for follow up for trigger finger.   07/09/20 [name], MD (cardiology): Patient presented for Afib follow  up.   Hospital visits: None in previous 6 months  Objective:  Lab Results  Component Value Date   CREATININE 0.90 03/14/2020   BUN 13 03/14/2020   GFR 89.47 01/17/2019   GFRNONAA >60 10/11/2018   GFRAA >60 10/11/2018   NA 141 03/14/2020   K 4.4 03/14/2020   CALCIUM 9.6 03/14/2020   CO2 30 03/14/2020   GLUCOSE 94 03/14/2020    Lab Results  Component Value Date/Time   HGBA1C 5.5 01/17/2019 10:02 AM   HGBA1C 5.5 03/02/2013 08:48 AM   GFR 89.47 01/17/2019 10:02 AM   GFR 95.23 07/15/2018 07:49 AM    Last diabetic Eye exam: No results found for: HMDIABEYEEXA  Last diabetic Foot exam: No results found for: HMDIABFOOTEX   Lab Results  Component Value Date   CHOL 191 03/14/2020   HDL 53 03/14/2020   LDLCALC 117 (H) 03/14/2020   TRIG 104 03/14/2020   CHOLHDL 3.6 03/14/2020    Hepatic Function Latest Ref Rng & Units 03/14/2020 01/17/2019 07/15/2018  Total Protein 6.1 - 8.1 g/dL 6.5 6.4 6.4  Albumin 3.5 - 5.2 g/dL - 4.2 4.2  AST 10 - 35 U/L 22 22 23   ALT 9 - 46 U/L 19 23 24   Alk Phosphatase 39 - 117 U/L - 50 66  Total Bilirubin 0.2 - 1.2 mg/dL 1.3(H) 1.2 1.2  Bilirubin, Direct 0.0 - 0.3 mg/dL - - -    Lab Results  Component Value Date/Time   TSH 2.47 07/15/2018 07:49 AM   TSH 2.79 06/29/2017 10:41 AM    CBC Latest Ref Rng & Units 03/14/2020 01/17/2019 10/11/2018  WBC 3.8 - 10.8  Thousand/uL 5.9 7.0 5.7  Hemoglobin 13.2 - 17.1 g/dL 16.0 16.0 16.3  Hematocrit 38.5 - 50.0 % 48.1 48.6 51.8  Platelets 140 - 400 Thousand/uL 223 224.0 176    No results found for: VD25OH  Clinical ASCVD: Yes  The 10-year ASCVD risk score Mikey Bussing DC Jr., et al., 2013) is: 30.2%   Values used to calculate the score:     Age: 30 years     Sex: Male     Is Non-Hispanic African American: No     Diabetic: No     Tobacco smoker: No     Systolic Blood Pressure: 947 mmHg     Is BP treated: Yes     HDL Cholesterol: 53 mg/dL     Total Cholesterol: 191 mg/dL    Depression screen [facility] 2/9 11/12/2020  03/14/2020 09/05/2019  Decreased Interest 0 0 0  Down, Depressed, Hopeless 0 0 0  PHQ - 2 Score 0 0 0     CHA2DS2/VAS Stroke Risk Points  Current as of 11 minutes ago     3 >= 2 Points: High Risk  1 - 1.99 Points: Medium Risk  0 Points: Low Risk    Last Change:       Details    This score determines the patient's risk of having a stroke if the  patient has atrial fibrillation.       Points Metrics  0 Has Congestive Heart Failure:  No    Current as of 11 minutes ago  0 Has Vascular Disease:  No    Current as of 11 minutes ago  1 Has Hypertension:  Yes    Current as of 11 minutes ago  2 Age:  49    Current as of 11 minutes ago  0 Has Diabetes:  No    Current as of 11 minutes ago  0 Had Stroke:  No  Had TIA:  No  Had Thromboembolism:  No    Current as of 11 minutes ago  0 Male:  No    Current as of 11 minutes ago      Social History   Tobacco Use  Smoking Status Former Smoker  . Packs/day: 0.50  . Years: 10.00  . Pack years: 5.00  . Types: Cigarettes  . Quit date: 08/05/1987  . Years since quitting: 33.3  Smokeless Tobacco Never Used   BP Readings from Last 3 Encounters:  11/19/20 136/88  11/12/20 122/72  10/05/20 102/62   Pulse Readings from Last 3 Encounters:  11/19/20 (!) 55  11/12/20 (!) 59  10/05/20 72   Wt Readings from Last 3 Encounters:  11/14/20 182 lb (82.6 kg)  11/12/20 186 lb (84.4 kg)  10/05/20 186 lb 14.4 oz (84.8 kg)   BMI Readings from Last 3 Encounters:  11/14/20 23.37 kg/m  11/12/20 23.88 kg/m  10/05/20 24.00 kg/m    Assessment/Interventions: Review of patient past medical history, allergies, medications, health status, including review of consultants reports, laboratory and other test data, was performed as part of comprehensive evaluation and provision of chronic care management services.   SDOH:  (Social Determinants of Health) assessments and interventions performed: Yes SDOH Interventions   Flowsheet Row Most Recent Value   SDOH Interventions   Financial Strain Interventions Intervention Not Indicated  Transportation Interventions Intervention Not Indicated     SDOH Screenings   Alcohol Screen: Low Risk   . Last Alcohol Screening Score (AUDIT): 4  Depression (PHQ2-9): Low Risk   . PHQ-2 Score:  0  Financial Resource Strain: Low Risk   . Difficulty of Paying Living Expenses: Not hard at all  Food Insecurity: No Food Insecurity  . Worried About Charity fundraiser in the Last Year: Never true  . Ran Out of Food in the Last Year: Never true  Housing: Not on file  Physical Activity: Sufficiently Active  . Days of Exercise per Week: 5 days  . Minutes of Exercise per Session: 30 min  Social Connections: Moderately Integrated  . Frequency of Communication with Friends and Family: More than three times a week  . Frequency of Social Gatherings with Friends and Family: More than three times a week  . Attends Religious Services: Never  . Active Member of Clubs or Organizations: Yes  . Attends Archivist Meetings: More than 4 times per year  . Marital Status: Married  Stress: No Stress Concern Present  . Feeling of Stress : Not at all  Tobacco Use: Medium Risk  . Smoking Tobacco Use: Former Smoker  . Smokeless Tobacco Use: Never Used  Transportation Needs: No Transportation Needs  . Lack of Transportation (Medical): No  . Lack of Transportation (Non-Medical): No   Patient plays golf 2-3 times a week and also goes to the health club (country club) twice a week to exercise. He also does some yard work with English as a second language teacher.  Patient lives with wife at home and "diva dog" who is 66 years old. Patient eats out for dinner twice a week and eats out lunch three times a week. Patient usually eats eggo waffles, sausage patties, blueberries, and strawberries for breakfast. For lunch he usually has a sandwich and ceasar salad and for dinner he usually has a salad or meat and veggies. He does eat chicken mostly  and fish a couple times a week. He doesn't eat many desserts but sometimes when watching a show and with wine. Patient drinks a couple soft drinks a week and iced tea at restaurant and drinks water the rest of the time.  Patient sleep varies a lot and usually has to get up once or twice to go to the bathroom at night. Sometimes he cannot fall back asleep but overall feels pretty rested and takes occasional naps. He sleeps about 8 hours per night  He denies any side effects from medications.  CCM Care Plan  No Known Allergies  Medications Reviewed Today    Reviewed by Kirke Shaggy, RN (Registered Nurse) on 11/19/20 at 1406  Med List Status: Complete  Medication Order Taking? Sig Documenting Provider Last Dose Status Informant  atenolol (TENORMIN) 25 MG tablet [id number] Yes TAKE ONE-HALF TABLET BY  MOUTH DAILY. MAY TAKE AN  EXTRA ONE-HALF TABLET DAILY FOR PALPITATIONS OR HEART  RACING.  Patient taking differently: Take 25 mg by mouth daily.   Evans Lance, MD 11/19/2020 Unknown time Active Self  atorvastatin (LIPITOR) 20 MG tablet [id number] Yes TAKE 1 TABLET BY MOUTH  DAILY  Patient taking differently: Take 20 mg by mouth daily.   Caren Macadam, MD 11/19/2020 Unknown time Active Self  flecainide (TAMBOCOR) 50 MG tablet [id number] Yes TAKE 1 TABLET BY MOUTH  TWICE DAILY  Patient taking differently: Take 50 mg by mouth 2 (two) times daily.   Evans Lance, MD 11/19/2020 Unknown time Active Self  ipratropium (ATROVENT) 0.03 % nasal spray [id number] Yes Place 1 spray into both nostrils daily. [provider] 11/19/2020 Unknown time Active Self  Multiple Vitamins-Minerals (CENTRUM SILVER PO) [id number] Yes  Take 1 capsule by mouth daily. [provider] 11/19/2020 Unknown time Active Self  niacin 500 MG tablet [id number] Yes Take 500 mg by mouth at bedtime. SR [provider] 11/19/2020 Unknown time Active Self  Omega-3 Fatty Acids (FISH OIL PO) [id number] Yes  Take 1,040 mg by mouth daily. [provider] 11/18/2020 Unknown time Active Self  omeprazole (PRILOSEC) 20 MG capsule [id number] Yes Take 20 mg by mouth daily. [provider] 11/19/2020 Unknown time Active Self  tadalafil (CIALIS) 5 MG tablet [id number] Yes Take 5 mg by mouth daily as needed for erectile dysfunction. [provider] Past Month Unknown time Active Self  XARELTO 20 MG TABS tablet [id number] Yes TAKE 1 TABLET BY MOUTH  DAILY WITH SUPPER  Patient taking differently: Take 20 mg by mouth daily.   Evans Lance, MD 11/16/2020 Active Self          Patient Active Problem List   Diagnosis Date Noted  . Aortic atherosclerosis ([place]) 11/12/2020  . 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 Basal cell carcinoma (BCC) of neck 07/17/2019  . Syncope and collapse 04/12/2018  . BPH associated with nocturia 04/06/2014  . HEMATURIA, HX OF 01/04/2009  . ESSENTIAL HYPERTENSION, BENIGN 12/04/2008  . Hyperlipemia 01/03/2008  . ERECTILE DYSFUNCTION, MILD 01/03/2008  . Allergic rhinitis 01/03/2008  . Paroxysmal atrial fibrillation ([place]) 04/20/2007    Immunization History  Administered Date(s) Administered  . Hepatitis A 07/02/2012, 01/10/2013  . Influenza Nasal 05/04/2017  . Influenza Split 04/27/2012  . Influenza Whole 05/04/2009  . Influenza, High Dose Seasonal PF 06/04/2018  . Influenza,inj,Quad PF,6+ Mos 05/05/2019  . Influenza-Unspecified 05/04/2014, 05/07/2015, 04/28/2016, 05/04/2020  . PFIZER(Purple Top)SARS-COV-2 Vaccination 08/17/2019, 09/05/2019, 04/12/2020  . PPD Test 04/16/2011, 04/27/2012, 04/27/2013, 04/11/2014  . Pneumococcal Conjugate-13 04/06/2014  . Pneumococcal Polysaccharide-23 12/10/2010  . Td 01/08/2005  . Tdap 05/09/2015  . Zoster 08/13/2009  . Zoster Recombinat (Shingrix) 09/02/2018, 02/23/2019    Conditions to be addressed/monitored:  Hypertension, Hyperlipidemia, Atrial Fibrillation, BPH and Allergic  Rhinitis  Care Plan : CCM Pharmacy Care Plan  Updates made by Viona Gilmore, [place] since 11/29/2020 12:00 AM    Problem: Problem: Hypertension, Hyperlipidemia, Atrial Fibrillation, BPH and Allergic Rhinitis     Long-Range Goal: Patient-Specific Goal   Start Date: 11/14/2020  Expected End Date: 11/14/2021  This Visit's Progress: On track  Priority: High  Note:   Current Barriers:  . Unable to achieve control of cholesterol   Pharmacist Clinical Goal(s):  Marland Kitchen Patient will achieve control of cholesterol as evidenced by next lipid panel  through collaboration with PharmD and provider.   Interventions: . 1:1 collaboration with Caren Macadam, MD regarding development and update of comprehensive plan of care as evidenced by provider attestation and co-signature . Inter-disciplinary care team collaboration (see longitudinal plan of care) . Comprehensive medication review performed; medication list updated in electronic medical record  Hypertension (BP goal <130/80) -Controlled -Current treatment: . Atenolol 25 mg 1 tablet daily -Medications previously tried: none  -Current home readings: 120/70s -Current dietary habits: rarely adds salt and wife tries to find foods with lower salt content by looking at package labels -Current exercise habits: active almost every day -Denies hypotensive/hypertensive symptoms -Educated on Exercise goal of 150 minutes per week; Importance of home blood pressure monitoring; Proper BP monitoring technique; -Counseled to monitor BP at home weekly, document, and provide log at future appointments -Counseled on diet and exercise extensively Recommended to continue current  medication  Hyperlipidemia: (LDL goal < 100) -Uncontrolled -Current treatment: . Atorvastatin 20 mg 1 tablet daily . Fish oil 1000 mg 1 capsule daily . Niacin 500 mg 1 tablet at bedtime -Medications previously tried: none  -Current dietary patterns: limits fried foods -Current  exercise habits: active almost daily -Educated on Cholesterol goals;  Benefits of statin for ASCVD risk reduction; Importance of limiting foods high in cholesterol; -Counseled on diet and exercise extensively Recommended to continue current medication Counseled on interaction with niacin and atorvastatin  Atrial Fibrillation (Goal: prevent stroke and major bleeding) -Controlled -CHADSVASC: 3 -Current treatment: . Rate/rhythm control: atenolol 25 mg 1 tablet daily; flecainide 50 mg 1 table twice daily . Anticoagulation: Xarelto 20 mg 1 tablet daily with supper -Medications previously tried: n/a -Home BP and HR readings: HR 70-80S -Counseled on bleeding risk associated with Xarelto and importance of self-monitoring for signs/symptoms of bleeding; avoidance of NSAIDs due to increased bleeding risk with anticoagulants; -Recommended to continue current medication  GERD (Goal: minimize symptoms) -Controlled -Current treatment  . Omeprazole 20 mg 1 capsule daily -Medications previously tried: none  -Counseled on non-pharmacologic management of symptoms such as elevating the head of your bed, avoiding eating 2-3 hours before bed, avoiding triggering foods such as acidic, spicy, or fatty foods, eating smaller meals, and wearing clothes that are loose around the waist  ED (Goal: minimize symptoms) -Controlled -Current treatment  . tadalafil 5 mg 1 tablet daily as needed  -Medications previously tried: none  -Recommended to continue current medication  Health Maintenance -Vaccine gaps: none -Current therapy:  . Ipratropium 0.03% nasal spray 1 spray in both nostrils daily . Multivitamin 1 tablet daily -Educated on Cost vs benefit of each product must be carefully weighed by individual consumer -Patient is satisfied with current therapy and denies issues -Recommended to continue current medication  Patient Goals/Self-Care Activities . Patient will:  - check blood pressure weekly,  document, and provide at future appointments target a minimum of 150 minutes of moderate intensity exercise weekly engage in dietary modifications by increasing fiber intake  Follow Up Plan: Telephone follow up appointment with care management team member scheduled for: 4 months       Medication Assistance: None required.  Patient affirms current coverage meets needs.  Patient's preferred pharmacy is:  [facility], [address], [address] [place], [place] [address] Phone: [phone] Fax: [phone]  [id number] [address] [place]Phone: [phone]: [phone] Uses pill box? Yes - in AM/PM Pt endorses 99% compliance - every 3-4 months  We discussed: Current pharmacy is preferred with insurance plan and patient is satisfied with pharmacy services Patient decided to: Continue current medication management strategy  Care Plan and Follow Up Patient Decision:  Patient agrees to Care Plan and Follow-up.  Plan: Telephone follow up appointment with care management team member scheduled for:  6 months  MJeni Salles PharmD BBookerPharmacist LFentonat BRed [phone]

## 2020-11-14 NOTE — Telephone Encounter (Signed)
Review of outside records  October 2018 colonoscopy Scattered small mouthed diverticula were found in the sigmoid colon. 2 semisessile polyp were found in the sigmoid colon.  The polyps were diminutive in size.  These were biopsied with cold forceps. A medium polyp was found in the proximal ascending colon.  Polyp was sessile.  The polyp was removed with a piecemeal technique using a hot biopsy forceps resection retrieval were complete. The exam was otherwise without abnormality. Pathology consistent with serrated sessile polyp for the larger polyp and hyperplastic polyps for the other polyps  2004 colonoscopy pathology Hyperplastic polyps  Seen by Dr. Watt Climes in April 2022 and now referred to me for consideration of advanced resection if necessary and surveillance colonoscopy.  These records will be scanned into the chart.  Patient already scheduled for colonoscopy within the next few weeks.  Justice Britain, MD [facility] Advanced Endoscopy Office # [phone]

## 2020-11-15 ENCOUNTER — Other Ambulatory Visit (HOSPITAL_COMMUNITY)
Admission: RE | Admit: 2020-11-15 | Discharge: 2020-11-15 | Disposition: A | Discharge status: Home / Self Care | Payer: Medicare Other | Source: Ambulatory Visit | Attending: Gastroenterology | Admitting: Gastroenterology

## 2020-11-15 ENCOUNTER — Other Ambulatory Visit: Payer: Medicare Other

## 2020-11-15 ENCOUNTER — Other Ambulatory Visit (HOSPITAL_COMMUNITY): Payer: Medicare Other

## 2020-11-15 DIAGNOSIS — Z20822 Contact with and (suspected) exposure to covid-19: Secondary | ICD-10-CM | POA: Insufficient documentation

## 2020-11-15 DIAGNOSIS — Z01812 Encounter for preprocedural laboratory examination: Secondary | ICD-10-CM | POA: Diagnosis not present

## 2020-11-15 LAB — SARS CORONAVIRUS 2 (TAT 6-24 HRS): SARS Coronavirus 2: NEGATIVE

## 2020-11-16 NOTE — Telephone Encounter (Signed)
I s/w the pt and assured him that he is cleared for his upcoming procedure 11/19/20. Pt is aware he can hold Xarelto 1-2 days prior to procedure. I did informed the pt since Dr. Perley Jain office is closed for [place] weekend he can then decide to hold Xarelto 1-2 days prior (whichever he feels more comfortable with, that the Pharm-D has give ok to hold 1-2 days). The pt is grateful for my call back to help clear things up for him. I will fax notes to Dr. Perley Jain office.

## 2020-11-16 NOTE — Telephone Encounter (Signed)
LVM to call back.

## 2020-11-16 NOTE — Telephone Encounter (Signed)
   Name: Garrett Vargas  DOB: 04/25/1946  MRN: [id number]   Primary Cardiologist: No primary care provider on file.  Chart reviewed as part of pre-operative protocol coverage. Patient was contacted 11/16/2020 in reference to pre-operative risk assessment for pending surgery as outlined below.  Garrett Vargas was last seen on 07/09/20 by Dr. Lovena Le.  Since that day, Garrett Vargas has done well from a cardiac perspective, no anginal symptoms. METS >4. Per Pharmacy OK to hold Xarelto 1-2 days prior to procedure.   Therefore, based on ACC/AHA guidelines, the patient would be at acceptable risk for the planned procedure without further cardiovascular testing.   The patient was advised that if he develops new symptoms prior to surgery to contact our office to arrange for a follow-up visit, and he verbalized understanding.  I will route this recommendation to the requesting party via Epic fax function and remove from pre-op pool. Please call with questions.  Dorrien Grunder Ninfa Meeker, PA-C 11/16/2020, 4:42 PM

## 2020-11-19 ENCOUNTER — Ambulatory Visit (HOSPITAL_COMMUNITY)
Admission: RE | Admit: 2020-11-19 | Discharge: 2020-11-19 | Disposition: A | Discharge status: Home / Self Care | Payer: Medicare Other | Attending: Gastroenterology | Admitting: Gastroenterology

## 2020-11-19 ENCOUNTER — Ambulatory Visit (HOSPITAL_COMMUNITY): Payer: Medicare Other | Admitting: Anesthesiology

## 2020-11-19 ENCOUNTER — Encounter (HOSPITAL_COMMUNITY)
Admission: RE | Disposition: A | Discharge status: Home / Self Care | Payer: Self-pay | Source: Home / Self Care | Attending: Gastroenterology

## 2020-11-19 ENCOUNTER — Other Ambulatory Visit: Payer: Self-pay

## 2020-11-19 ENCOUNTER — Encounter (HOSPITAL_COMMUNITY): Payer: Self-pay | Admitting: Gastroenterology

## 2020-11-19 DIAGNOSIS — K644 Residual hemorrhoidal skin tags: Secondary | ICD-10-CM | POA: Insufficient documentation

## 2020-11-19 DIAGNOSIS — E785 Hyperlipidemia, unspecified: Secondary | ICD-10-CM | POA: Insufficient documentation

## 2020-11-19 DIAGNOSIS — K635 Polyp of colon: Secondary | ICD-10-CM | POA: Diagnosis not present

## 2020-11-19 DIAGNOSIS — K641 Second degree hemorrhoids: Secondary | ICD-10-CM | POA: Diagnosis not present

## 2020-11-19 DIAGNOSIS — K5730 Diverticulosis of large intestine without perforation or abscess without bleeding: Secondary | ICD-10-CM | POA: Diagnosis not present

## 2020-11-19 DIAGNOSIS — D122 Benign neoplasm of ascending colon: Secondary | ICD-10-CM | POA: Diagnosis not present

## 2020-11-19 DIAGNOSIS — I4891 Unspecified atrial fibrillation: Secondary | ICD-10-CM | POA: Diagnosis not present

## 2020-11-19 DIAGNOSIS — Z8249 Family history of ischemic heart disease and other diseases of the circulatory system: Secondary | ICD-10-CM | POA: Diagnosis not present

## 2020-11-19 DIAGNOSIS — I10 Essential (primary) hypertension: Secondary | ICD-10-CM | POA: Diagnosis not present

## 2020-11-19 DIAGNOSIS — Z86010 Personal history of colonic polyps: Secondary | ICD-10-CM | POA: Diagnosis not present

## 2020-11-19 DIAGNOSIS — K621 Rectal polyp: Secondary | ICD-10-CM

## 2020-11-19 DIAGNOSIS — Z96642 Presence of left artificial hip joint: Secondary | ICD-10-CM | POA: Diagnosis not present

## 2020-11-19 DIAGNOSIS — K648 Other hemorrhoids: Secondary | ICD-10-CM | POA: Diagnosis not present

## 2020-11-19 DIAGNOSIS — Z87891 Personal history of nicotine dependence: Secondary | ICD-10-CM | POA: Insufficient documentation

## 2020-11-19 DIAGNOSIS — Z1211 Encounter for screening for malignant neoplasm of colon: Secondary | ICD-10-CM | POA: Insufficient documentation

## 2020-11-19 HISTORY — PX: ENDOSCOPIC MUCOSAL RESECTION: SHX6839

## 2020-11-19 HISTORY — PX: COLONOSCOPY WITH PROPOFOL: SHX5780

## 2020-11-19 HISTORY — PX: POLYPECTOMY: SHX5525

## 2020-11-19 HISTORY — PX: SUBMUCOSAL LIFTING INJECTION: SHX6855

## 2020-11-19 HISTORY — PX: HEMOSTASIS CLIP PLACEMENT: SHX6857

## 2020-11-19 SURGERY — COLONOSCOPY WITH PROPOFOL
Anesthesia: Monitor Anesthesia Care

## 2020-11-19 MED ORDER — LACTATED RINGERS IV SOLN: INTRAVENOUS | Status: DC

## 2020-11-19 MED ORDER — PROPOFOL 10 MG/ML IV BOLUS
INTRAVENOUS | Status: DC | PRN
  Administered 2020-11-19 (×4): 20 mg via INTRAVENOUS

## 2020-11-19 MED ORDER — SODIUM CHLORIDE 0.9 % IV SOLN
INTRAVENOUS | Status: DC
Start: 2020-11-19 — End: 2020-11-19

## 2020-11-19 MED ORDER — PROPOFOL 10 MG/ML IV BOLUS
INTRAVENOUS | Status: AC
  Filled 2020-11-19: qty 20

## 2020-11-19 MED ORDER — PROPOFOL 500 MG/50ML IV EMUL
INTRAVENOUS | Status: DC | PRN
  Administered 2020-11-19: 125 ug/kg/min via INTRAVENOUS

## 2020-11-19 MED ORDER — RIVAROXABAN 20 MG PO TABS: 20.0000 mg | ORAL_TABLET | Freq: Every day | ORAL | 3 refills | Status: DC

## 2020-11-19 SURGICAL SUPPLY — 22 items

## 2020-11-19 NOTE — Anesthesia Postprocedure Evaluation (Signed)
Anesthesia Post Note  Patient: REMI LOPATA  Procedure(s) Performed: COLONOSCOPY WITH PROPOFOL (N/A ) ENDOSCOPIC MUCOSAL RESECTION (N/A ) SUBMUCOSAL LIFTING INJECTION HEMOSTASIS CLIP PLACEMENT POLYPECTOMY     Patient location during evaluation: Endoscopy Anesthesia Type: MAC Level of consciousness: awake and alert, patient cooperative and oriented Pain management: pain level controlled Vital Signs Assessment: post-procedure vital signs reviewed and stable Respiratory status: spontaneous breathing, nonlabored ventilation and respiratory function stable Cardiovascular status: blood pressure returned to baseline and stable Postop Assessment: no apparent nausea or vomiting and able to ambulate Anesthetic complications: no   No complications documented.  Last Vitals:  Vitals:   11/19/20 1550 11/19/20 1600  BP: 132/88 136/88  Pulse: (!) 59 (!) 55  Resp: 18 15  Temp:    SpO2: 99% 98%    Last Pain:  Vitals:   11/19/20 1600  TempSrc:   PainSc: 0-No pain                 Katera Rybka,E. Beauford Lando

## 2020-11-19 NOTE — Op Note (Addendum)
[facility] Patient Name: Garrett Vargas Procedure Date: 11/19/2020 MRN: [id number] Attending MD: Justice Britain , MD Date of Birth: [birth year] CSN: [id number] Age: 75 Admit Type: Outpatient Procedure:                Colonoscopy Indications:              High risk colon cancer surveillance: Personal                            history of sessile serrated colon polyp (10 mm or                            greater in size) Providers:                Justice Britain, MD, Jeanella Cara, RN,                            Cletis Athens, Technician, Caryl Pina CRNA Referring MD:             Clarene Essex, MD, Micheline Rough MD, MD Medicines:                Monitored Anesthesia Care Complications:            No immediate complications. Estimated Blood Loss:     Estimated blood loss was minimal. Procedure:                Pre-Anesthesia Assessment:                           - Prior to the procedure, a History and Physical                            was performed, and patient medications and                            allergies were reviewed. The patient's tolerance of                            previous anesthesia was also reviewed. The risks                            and benefits of the procedure and the sedation                            options and risks were discussed with the patient.                            All questions were answered, and informed consent                            was obtained. Prior Anticoagulants: The patient has                            taken Xarelto (rivaroxaban), last dose was 3 days  prior to procedure. ASA Grade Assessment: III - A                            patient with severe systemic disease. After                            reviewing the risks and benefits, the patient was                            deemed in satisfactory condition to undergo the                            procedure.                            After obtaining informed consent, the colonoscope                            was passed under direct vision. Throughout the                            procedure, the patient's blood pressure, pulse, and                            oxygen saturations were monitored continuously. The                            CF-HQ190L ([id number]) Olympus colonoscope was                            introduced through the anus and advanced to the the                            cecum, identified by appendiceal orifice and                            ileocecal valve. The colonoscopy was performed                            without difficulty. The patient tolerated the                            procedure. The quality of the bowel preparation was                            adequate. The ileocecal valve, appendiceal orifice,                            and rectum were photographed. Scope In: 2:27:40 PM Scope Out: 3:27:35 PM Scope Withdrawal Time: 0 hours 55 minutes 23 seconds  Total Procedure Duration: 0 hours 59 minutes 55 seconds  Findings:      The digital rectal exam findings include hemorrhoids. Pertinent       negatives include no palpable rectal lesions.      A 24  mm polyp was found in the proximal ascending colon within a       previous scar. The polyp was sessile. Preparations were made for mucosal       resection. NBI imaging and White-light endoscopy was done to demarcate       the borders of the lesion. Orise gel was injected to raise the lesion.       Piecemeal mucosal resection using a snare was performed. Resection and       retrieval were complete. Coagulation for tissue destruction using snare       tip with STSC was successful to the margin. To prevent bleeding after       mucosal resection, five hemostatic clips were successfully placed (MR       conditional). There was no bleeding at the end of the procedure.      Six sessile polyps were found in the rectum (4), descending colon (1)        and transverse colon (1). The polyps were 3 to 6 mm in size. These       polyps were removed with a cold snare. Resection and retrieval were       complete.      Multiple small-mouthed diverticula were found in the recto-sigmoid       colon, sigmoid colon and descending colon.      Normal mucosa was found in the entire colon otherwise.      Non-bleeding non-thrombosed external and internal hemorrhoids were found       during retroflexion, during perianal exam and during digital exam. The       hemorrhoids were Grade II (internal hemorrhoids that prolapse but reduce       spontaneously). Impression:               - Hemorrhoids found on digital rectal exam.                           - One, 24 mm polyp in the proximal ascending colon                            within a previous scar stie, removed with piecemeal                            mucosal resection. Resected and retrieved. Treated                            with STSC. Clips (MR conditional) were placed.                           - Six 3 to 6 mm polyps in the rectum, in the                            descending colon and in the transverse colon,                            removed with a cold snare. Resected and retrieved.                           - Diverticulosis in the recto-sigmoid colon, in the  sigmoid colon and in the descending colon.                           - Normal mucosa in the entire examined colon                            otherwise.                           - Non-bleeding non-thrombosed external and internal                            hemorrhoids. Moderate Sedation:      Not Applicable - Patient had care per Anesthesia. Recommendation:           - The patient will be observed post-procedure,                            until all discharge criteria are met.                           - Discharge patient to home.                           - Patient has a contact number available for                             emergencies. The signs and symptoms of potential                            delayed complications were discussed with the                            patient. Return to normal activities tomorrow.                            Written discharge instructions were provided to the                            patient.                           - High fiber diet.                           - May restart Xarelto in 72 hours to decrease risk                            of post-interventional bleeding.                           - Continue present medications.                           - Await pathology results.                           -  Repeat colonoscopy in 1 year for surveillance and                            have Endorotor available if necessary.                           - No aspirin, ibuprofen, naproxen, or other                            non-steroidal anti-inflammatory drugs for 2 weeks                            after polyp removal.                           - The findings and recommendations were discussed                            with the patient.                           - The findings and recommendations were discussed                            with the patient's family. Procedure Code(s):        --- Professional ---                           [id number], Colonoscopy, flexible; with endoscopic                            mucosal resection                           45385, 49, Colonoscopy, flexible; with removal of                            tumor(s), polyp(s), or other lesion(s) by snare                            technique Diagnosis Code(s):        --- Professional ---                           Z86.010, Personal history of colonic polyps                           K64.1, Second degree hemorrhoids                           K63.5, Polyp of colon                           K62.1, Rectal polyp                           K57.30, Diverticulosis of large intestine without  perforation or abscess without bleeding CPT copyright 2019 American Medical Association. All rights reserved. The codes documented in this report are preliminary and upon coder review may  be revised to meet current compliance requirements. Justice Britain, MD 11/19/2020 3:40:17 PM Number of Addenda: 0

## 2020-11-19 NOTE — Discharge Instructions (Signed)

## 2020-11-19 NOTE — Transfer of Care (Signed)
Immediate Anesthesia Transfer of Care Note  Patient: Garrett Vargas  Procedure(s) Performed: COLONOSCOPY WITH PROPOFOL (N/A ) ENDOSCOPIC MUCOSAL RESECTION (N/A ) SUBMUCOSAL LIFTING INJECTION HEMOSTASIS CLIP PLACEMENT POLYPECTOMY  Patient Location: PACU  Anesthesia Type:MAC  Level of Consciousness: awake, alert  and oriented  Airway & Oxygen Therapy: Patient Spontanous Breathing and Patient connected to face mask oxygen  Post-op Assessment: Report given to RN, Post -op Vital signs reviewed and stable and Patient moving all extremities X 4  Post vital signs: Reviewed and stable  Last Vitals:  Vitals Value Taken Time  BP    Temp    Pulse 58 11/19/20 1536  Resp 16 11/19/20 1536  SpO2 100 % 11/19/20 1536  Vitals shown include unvalidated device data.  Last Pain:  Vitals:   11/19/20 1146  TempSrc: Oral  PainSc: 0-No pain         Complications: No complications documented.

## 2020-11-19 NOTE — Anesthesia Preprocedure Evaluation (Addendum)
Anesthesia Evaluation  Patient identified by MRN, date of birth, ID band Patient awake    Reviewed: Allergy & Precautions, H&P , NPO status , Patient's Chart, lab work & pertinent test results, reviewed documented beta blocker date and time   Airway Mallampati: II  TM Distance: >3 FB Neck ROM: Full    Dental no notable dental hx. (+) Teeth Intact, Dental Advisory Given   Pulmonary neg pulmonary ROS, former smoker,    Pulmonary exam normal breath sounds clear to auscultation       Cardiovascular hypertension, Pt. on medications and Pt. on home beta blockers + dysrhythmias Atrial Fibrillation  Rhythm:Regular Rate:Normal     Neuro/Psych negative neurological ROS  negative psych ROS   GI/Hepatic negative GI ROS, Neg liver ROS,   Endo/Other  negative endocrine ROS  Renal/GU negative Renal ROS  negative genitourinary   Musculoskeletal  (+) Arthritis , Osteoarthritis,    Abdominal   Peds  Hematology negative hematology ROS (+)   Anesthesia Other Findings   Reproductive/Obstetrics negative OB ROS                            Anesthesia Physical Anesthesia Plan  ASA: III  Anesthesia Plan: MAC   Post-op Pain Management:    Induction: Intravenous  PONV Risk Score and Plan: 1 and Propofol infusion  Airway Management Planned: Simple Face Mask  Additional Equipment:   Intra-op Plan:   Post-operative Plan:   Informed Consent: I have reviewed the patients History and Physical, chart, labs and discussed the procedure including the risks, benefits and alternatives for the proposed anesthesia with the patient or authorized representative who has indicated his/her understanding and acceptance.     Dental advisory given  Plan Discussed with: CRNA  Anesthesia Plan Comments:         Anesthesia Quick Evaluation

## 2020-11-19 NOTE — H&P (Signed)
GASTROENTEROLOGY PROCEDURE H&P NOTE   Primary Care Physician: Caren Macadam, MD  HPI: Garrett Vargas is a 75 y.o. male who presents for Colonoscopy with possible EMR of follow up prior SSP removed in piecemeal by outside proceduralist here for first follow up.  Past Medical History:  Diagnosis Date  . A-fib ([place])   . ALLERGIC RHINITIS   . Arthritis   . Colon polyp   . Dysrhythmia   . Erectile dysfunction   . History of bronchitis as a child   . Hyperlipemia   . Hypertension   . S/P lateral meniscectomy of left knee    Past Surgical History:  Procedure Laterality Date  . CARDIOVERSION N/A 03/17/2014   Procedure: CARDIOVERSION;  Surgeon: Sanda Klein, MD;  Location: [facility];  Service: Cardiovascular;  Laterality: N/A;  . colonscopy     . ELBOW ARTHROSCOPY Bilateral   . LOOP RECORDER INSERTION N/A 04/12/2018   Procedure: LOOP RECORDER INSERTION;  Surgeon: Evans Lance, MD;  Location: [facility];  Service: Cardiovascular;  Laterality: N/A;  . Multiple Knee,elbow and foot surgeries    . neuroma left foot,2010, Dr. Anne Fu    . TEE WITHOUT CARDIOVERSION N/A 03/17/2014   Procedure: TRANSESOPHAGEAL ECHOCARDIOGRAM (TEE);  Surgeon: Sanda Klein, MD;  Location: [facility];  Service: Cardiovascular;  Laterality: N/A;  . TOTAL HIP ARTHROPLASTY Left 07/18/2015   Procedure: LEFT TOTAL HIP ARTHROPLASTY ANTERIOR APPROACH;  Surgeon: Gaynelle Arabian, MD;  Location: [facility];  Service: Orthopedics;  Laterality: Left;   Current Facility-Administered Medications  Medication Dose Route Frequency Provider Last Rate Last Admin  . lactated ringers infusion   Intravenous Continuous Mansouraty, Telford Nab., MD 20 mL/hr at 11/19/20 1156 New Bag at 11/19/20 1156   No Known Allergies Family History  Problem Relation Age of Onset  . Coronary artery disease Other   . Prostate cancer Other   . Stroke Other   . Heart disease Mother   . Stroke Mother 73  . Prostate  cancer Father 46  . Kidney disease Sister    Social History   Socioeconomic History  . Marital status: Married    Spouse name: Not on file  . Number of children: 3  . Years of education: Not on file  . Highest education level: Professional school degree (e.g., MD, DDS, DVM, JD)  Occupational History  . Occupation: retired Chief Financial Officer  . Occupation: former smoker  . Occupation: alocohol use-yes  . Occupation: Drug use-no  . Occupation: regular exercise-yes  Tobacco Use  . Smoking status: Former Smoker    Packs/day: 0.50    Years: 10.00    Pack years: 5.00    Types: Cigarettes    Quit date: 08/05/1987    Years since quitting: 33.3  . Smokeless tobacco: Never Used  Vaping Use  . Vaping Use: Never used  Substance and Sexual Activity  . Alcohol use: Yes    Alcohol/week: 0.0 standard drinks    Comment: occasional  . Drug use: No  . Sexual activity: Not on file  Other Topics Concern  . Not on file  Social History Narrative  . Not on file   Social Determinants of Health   Financial Resource Strain: Low Risk   . Difficulty of Paying Living Expenses: Not hard at all  Food Insecurity: No Food Insecurity  . Worried About Charity fundraiser in the Last Year: Never true  . Ran Out of Food in the Last Year: Never true  Transportation  Needs: No Transportation Needs  . Lack of Transportation (Medical): No  . Lack of Transportation (Non-Medical): No  Physical Activity: Sufficiently Active  . Days of Exercise per Week: 5 days  . Minutes of Exercise per Session: 30 min  Stress: No Stress Concern Present  . Feeling of Stress : Not at all  Social Connections: Moderately Integrated  . Frequency of Communication with Friends and Family: More than three times a week  . Frequency of Social Gatherings with Friends and Family: More than three times a week  . Attends Religious Services: Never  . Active Member of Clubs or Organizations: Yes  . Attends Archivist Meetings: More  than 4 times per year  . Marital Status: Married  Human resources officer Violence: Not At Risk  . Fear of Current or Ex-Partner: No  . Emotionally Abused: No  . Physically Abused: No  . Sexually Abused: No    Physical Exam: Vital signs in last 24 hours: Temp:  [97.6 F (36.4 C)] 97.6 F (36.4 C) (04/18 1146) Pulse Rate:  [62] 62 (04/18 1146) Resp:  [18] 18 (04/18 1146) BP: (147)/(81) 147/81 (04/18 1146) SpO2:  [98 %] 98 % (04/18 1146)   GEN: NAD EYE: Sclerae anicteric ENT: MMM CV: Non-tachycardic GI: Soft, NT/ND NEURO:  Alert & Oriented x 3  Lab Results: No results for input(s): WBC, HGB, HCT, PLT in the last 72 hours. BMET No results for input(s): NA, K, CL, CO2, GLUCOSE, BUN, CREATININE, CALCIUM in the last 72 hours. LFT No results for input(s): PROT, ALBUMIN, AST, ALT, ALKPHOS, BILITOT, BILIDIR, IBILI in the last 72 hours. PT/INR No results for input(s): LABPROT, INR in the last 72 hours.   Impression / Plan: This is a 75 y.o.male who presents for Colonoscopy with possible EMR of follow up prior SSP removed in piecemeal by outside proceduralist here for first follow up.  The risks and benefits of endoscopic evaluation were discussed with the patient; these include but are not limited to the risk of perforation, infection, bleeding, missed lesions, lack of diagnosis, severe illness requiring hospitalization, as well as anesthesia and sedation related illnesses.  The patient is agreeable to proceed.    Justice Britain, MD [facility] Office # [phone]

## 2020-11-20 LAB — SURGICAL PATHOLOGY

## 2020-11-21 ENCOUNTER — Encounter (HOSPITAL_COMMUNITY): Payer: Self-pay | Admitting: Gastroenterology

## 2020-11-23 ENCOUNTER — Ambulatory Visit (INDEPENDENT_AMBULATORY_CARE_PROVIDER_SITE_OTHER): Payer: Medicare Other

## 2020-11-23 ENCOUNTER — Encounter: Payer: Self-pay | Admitting: Gastroenterology

## 2020-11-23 DIAGNOSIS — R55 Syncope and collapse: Secondary | ICD-10-CM

## 2020-11-23 LAB — CUP PACEART REMOTE DEVICE CHECK
Date Time Interrogation Session: 20220421235830
Implantable Pulse Generator Implant Date: 20190909

## 2020-11-29 NOTE — Patient Instructions (Addendum)
Hi Garrett Vargas,  It was great to get to meet you in person! Below is a summary of some of the topics we discussed.   Please reach out to me if you have any questions or need anything before our follow up!  Best, Maddie  Jeni Salles, PharmD, [facility]  Visit Information  Goals Addressed   None    Patient Care Plan: CCM Pharmacy Care Plan    Problem Identified: Problem: Hypertension, Hyperlipidemia, Atrial Fibrillation, BPH and Allergic Rhinitis     Long-Range Goal: Patient-Specific Goal   Start Date: 11/14/2020  Expected End Date: 11/14/2021  This Visit's Progress: On track  Priority: High  Note:   Current Barriers:  . Unable to achieve control of cholesterol   Pharmacist Clinical Goal(s):  Marland Kitchen Patient will achieve control of cholesterol as evidenced by next lipid panel  through collaboration with PharmD and provider.   Interventions: . 1:1 collaboration with Caren Macadam, MD regarding development and update of comprehensive plan of care as evidenced by provider attestation and co-signature . Inter-disciplinary care team collaboration (see longitudinal plan of care) . Comprehensive medication review performed; medication list updated in electronic medical record  Hypertension (BP goal <130/80) -Controlled -Current treatment: . Atenolol 25 mg 1 tablet daily -Medications previously tried: none  -Current home readings: 120/70s -Current dietary habits: rarely adds salt and wife tries to find foods with lower salt content by looking at package labels -Current exercise habits: active almost every day -Denies hypotensive/hypertensive symptoms -Educated on Exercise goal of 150 minutes per week; Importance of home blood pressure monitoring; Proper BP monitoring technique; -Counseled to monitor BP at home weekly, document, and provide log at future appointments -Counseled on diet and exercise  extensively Recommended to continue current medication  Hyperlipidemia: (LDL goal < 100) -Uncontrolled -Current treatment: . Atorvastatin 20 mg 1 tablet daily . Fish oil 1000 mg 1 capsule daily . Niacin 500 mg 1 tablet at bedtime -Medications previously tried: none  -Current dietary patterns: limits fried foods -Current exercise habits: active almost daily -Educated on Cholesterol goals;  Benefits of statin for ASCVD risk reduction; Importance of limiting foods high in cholesterol; -Counseled on diet and exercise extensively Recommended to continue current medication Counseled on interaction with niacin and atorvastatin  Atrial Fibrillation (Goal: prevent stroke and major bleeding) -Controlled -CHADSVASC: 3 -Current treatment: . Rate/rhythm control: atenolol 25 mg 1 tablet daily; flecainide 50 mg 1 table twice daily . Anticoagulation: Xarelto 20 mg 1 tablet daily with supper -Medications previously tried: n/a -Home BP and HR readings: HR 70-80S -Counseled on bleeding risk associated with Xarelto and importance of self-monitoring for signs/symptoms of bleeding; avoidance of NSAIDs due to increased bleeding risk with anticoagulants; -Recommended to continue current medication  GERD (Goal: minimize symptoms) -Controlled -Current treatment  . Omeprazole 20 mg 1 capsule daily -Medications previously tried: none  -Counseled on non-pharmacologic management of symptoms such as elevating the head of your bed, avoiding eating 2-3 hours before bed, avoiding triggering foods such as acidic, spicy, or fatty foods, eating smaller meals, and wearing clothes that are loose around the waist  ED (Goal: minimize symptoms) -Controlled -Current treatment  . tadalafil 5 mg 1 tablet daily as needed  -Medications previously tried: none  -Recommended to continue current medication  Health Maintenance -Vaccine gaps: none -Current therapy:  . Ipratropium 0.03% nasal spray 1 spray in both  nostrils daily . Multivitamin 1 tablet daily -Educated on Cost vs benefit of each product  must be carefully weighed by individual consumer -Patient is satisfied with current therapy and denies issues -Recommended to continue current medication  Patient Goals/Self-Care Activities . Patient will:  - check blood pressure weekly, document, and provide at future appointments target a minimum of 150 minutes of moderate intensity exercise weekly engage in dietary modifications by increasing fiber intake  Follow Up Plan: Telephone follow up appointment with care management team member scheduled for: 6 months      Garrett Vargas was given information about Chronic Care Management services today including:  1. CCM service includes personalized support from designated clinical staff supervised by his physician, including individualized plan of care and coordination with other care providers 2. 24/7 contact phone numbers for assistance for urgent and routine care needs. 3. Standard insurance, coinsurance, copays and deductibles apply for chronic care management only during months in which we provide at least 20 minutes of these services. Most insurances cover these services at 100%, however patients may be responsible for any copay, coinsurance and/or deductible if applicable. This service may help you avoid the need for more expensive face-to-face services. 4. Only one practitioner may furnish and bill the service in a calendar month. 5. The patient may stop CCM services at any time (effective at the end of the month) by phone call to the office staff.  Patient agreed to services and verbal consent obtained.   The patient verbalized understanding of instructions, educational materials, and care plan provided today and agreed to receive a mailed copy of patient instructions, educational materials, and care plan.  Telephone follow up appointment with pharmacy team member scheduled for: 6  months  Viona Gilmore, [facility]  High Cholesterol  High cholesterol is a condition in which the blood has high levels of a white, waxy substance similar to fat (cholesterol). The liver makes all the cholesterol that the body needs. The human body needs small amounts of cholesterol to help build cells. A person gets extra or excess cholesterol from the food that he or she eats. The blood carries cholesterol from the liver to the rest of the body. If you have high cholesterol, deposits (plaques) may build up on the walls of your arteries. Arteries are the blood vessels that carry blood away from your heart. These plaques make the arteries narrow and stiff. Cholesterol plaques increase your risk for heart attack and stroke. Work with your health care provider to keep your cholesterol levels in a healthy range. What increases the risk? The following factors may make you more likely to develop this condition:  Eating foods that are high in animal fat (saturated fat) or cholesterol.  Being overweight.  Not getting enough exercise.  A family history of high cholesterol (familial hypercholesterolemia).  Use of tobacco products.  Having diabetes. What are the signs or symptoms? There are no symptoms of this condition. How is this diagnosed? This condition may be diagnosed based on the results of a blood test.  If you are older than 75 years of age, your health care provider may check your cholesterol levels every 4-6 years.  You may be checked more often if you have high cholesterol or other risk factors for heart disease. The blood test for cholesterol measures:  "Bad" cholesterol, or LDL cholesterol. This is the main type of cholesterol that causes heart disease. The desired level is less than 100 mg/dL.  "Good" cholesterol, or HDL cholesterol. HDL helps protect against heart disease by cleaning the arteries and carrying the LDL to  the liver for processing. The desired level for HDL is 60  mg/dL or higher.  Triglycerides. These are fats that your body can store or burn for energy. The desired level is less than 150 mg/dL.  Total cholesterol. This measures the total amount of cholesterol in your blood and includes LDL, HDL, and triglycerides. The desired level is less than 200 mg/dL. How is this treated? This condition may be treated with:  Diet changes. You may be asked to eat foods that have more fiber and less saturated fats or added sugar.  Lifestyle changes. These may include regular exercise, maintaining a healthy weight, and quitting use of tobacco products.  Medicines. These are given when diet and lifestyle changes have not worked. You may be prescribed a statin medicine to help lower your cholesterol levels. Follow these instructions at home: Eating and drinking  Eat a healthy, balanced diet. This diet includes: ? Daily servings of a variety of fresh, frozen, or canned fruits and vegetables. ? Daily servings of whole grain foods that are rich in fiber. ? Foods that are low in saturated fats and trans fats. These include poultry and fish without skin, lean cuts of meat, and low-fat dairy products. ? A variety of fish, especially oily fish that contain omega-3 fatty acids. Aim to eat fish at least 2 times a week.  Avoid foods and drinks that have added sugar.  Use healthy cooking methods, such as roasting, grilling, broiling, baking, poaching, steaming, and stir-frying. Do not fry your food except for stir-frying.   Lifestyle  Get regular exercise. Aim to exercise for a total of 150 minutes a week. Increase your activity level by doing activities such as gardening, walking, and taking the stairs.  Do not use any products that contain nicotine or tobacco, such as cigarettes, e-cigarettes, and chewing tobacco. If you need help quitting, ask your health care provider.   General instructions  Take over-the-counter and prescription medicines only as told by your  health care provider.  Keep all follow-up visits as told by your health care provider. This is important. Where to find more information  American Heart Association: www.heart.org  National Heart, Lung, and Blood Institute: https://wilson-eaton.com/ Contact a health care provider if:  You have trouble achieving or maintaining a healthy diet or weight.  You are starting an exercise program.  You are unable to stop smoking. Get help right away if:  You have chest pain.  You have trouble breathing.  You have any symptoms of a stroke. "BE FAST" is an easy way to remember the main warning signs of a stroke: ? B - Balance. Signs are dizziness, sudden trouble walking, or loss of balance. ? E - Eyes. Signs are trouble seeing or a sudden change in vision. ? F - Face. Signs are sudden weakness or numbness of the face, or the face or eyelid drooping on one side. ? A - Arms. Signs are weakness or numbness in an arm. This happens suddenly and usually on one side of the body. ? S - Speech. Signs are sudden trouble speaking, slurred speech, or trouble understanding what people say. ? T - Time. Time to call emergency services. Write down what time symptoms started.  You have other signs of a stroke, such as: ? A sudden, severe headache with no known cause. ? Nausea or vomiting. ? Seizure. These symptoms may represent a serious problem that is an emergency. Do not wait to see if the symptoms will go away. Get medical  help right away. Call your local emergency services (911 in the [place] Do not drive yourself to the hospital. Summary  Cholesterol plaques increase your risk for heart attack and stroke. Work with your health care provider to keep your cholesterol levels in a healthy range.  Eat a healthy, balanced diet, get regular exercise, and maintain a healthy weight.  Do not use any products that contain nicotine or tobacco, such as cigarettes, e-cigarettes, and chewing tobacco.  Get help right  away if you have any symptoms of a stroke. This information is not intended to replace advice given to you by your health care provider. Make sure you discuss any questions you have with your health care provider. Document Revised: 06/20/2019 Document Reviewed: 06/20/2019 Elsevier Patient Education  2021 [employer].

## 2020-12-07 NOTE — Progress Notes (Signed)
[facility] Summary Report / Loop Recorder 

## 2020-12-26 ENCOUNTER — Ambulatory Visit (INDEPENDENT_AMBULATORY_CARE_PROVIDER_SITE_OTHER): Payer: Medicare Other

## 2020-12-26 DIAGNOSIS — R55 Syncope and collapse: Secondary | ICD-10-CM

## 2020-12-26 LAB — CUP PACEART REMOTE DEVICE CHECK
Date Time Interrogation Session: 20220524235754
Implantable Pulse Generator Implant Date: 20190909

## 2020-12-28 DIAGNOSIS — M65341 Trigger finger, right ring finger: Secondary | ICD-10-CM | POA: Diagnosis not present

## 2021-01-16 NOTE — Progress Notes (Signed)
[facility] Summary Report / Loop Recorder 

## 2021-01-28 ENCOUNTER — Ambulatory Visit (INDEPENDENT_AMBULATORY_CARE_PROVIDER_SITE_OTHER): Payer: Medicare Other

## 2021-01-28 DIAGNOSIS — R55 Syncope and collapse: Secondary | ICD-10-CM

## 2021-01-28 LAB — CUP PACEART REMOTE DEVICE CHECK
Date Time Interrogation Session: 20220627003323
Implantable Pulse Generator Implant Date: 20190909

## 2021-02-04 ENCOUNTER — Other Ambulatory Visit: Payer: Self-pay | Admitting: Family Medicine

## 2021-02-04 DIAGNOSIS — E7800 Pure hypercholesterolemia, unspecified: Secondary | ICD-10-CM

## 2021-02-13 NOTE — Progress Notes (Signed)
[facility] Summary Report / Loop Recorder 

## 2021-03-02 LAB — CUP PACEART REMOTE DEVICE CHECK
Date Time Interrogation Session: 20220730003758
Implantable Pulse Generator Implant Date: 20190909

## 2021-03-04 ENCOUNTER — Ambulatory Visit (INDEPENDENT_AMBULATORY_CARE_PROVIDER_SITE_OTHER): Payer: Medicare Other

## 2021-03-04 DIAGNOSIS — I480 Paroxysmal atrial fibrillation: Secondary | ICD-10-CM

## 2021-03-18 DIAGNOSIS — L819 Disorder of pigmentation, unspecified: Secondary | ICD-10-CM | POA: Diagnosis not present

## 2021-03-18 DIAGNOSIS — L821 Other seborrheic keratosis: Secondary | ICD-10-CM | POA: Diagnosis not present

## 2021-03-18 DIAGNOSIS — L814 Other melanin hyperpigmentation: Secondary | ICD-10-CM | POA: Diagnosis not present

## 2021-03-18 DIAGNOSIS — L905 Scar conditions and fibrosis of skin: Secondary | ICD-10-CM | POA: Diagnosis not present

## 2021-03-18 DIAGNOSIS — D1801 Hemangioma of skin and subcutaneous tissue: Secondary | ICD-10-CM | POA: Diagnosis not present

## 2021-03-18 DIAGNOSIS — Z85828 Personal history of other malignant neoplasm of skin: Secondary | ICD-10-CM | POA: Diagnosis not present

## 2021-03-18 DIAGNOSIS — L570 Actinic keratosis: Secondary | ICD-10-CM | POA: Diagnosis not present

## 2021-03-25 DIAGNOSIS — M65341 Trigger finger, right ring finger: Secondary | ICD-10-CM | POA: Diagnosis not present

## 2021-03-28 NOTE — Progress Notes (Signed)
[facility] Summary Report / Loop Recorder 

## 2021-04-04 ENCOUNTER — Ambulatory Visit (INDEPENDENT_AMBULATORY_CARE_PROVIDER_SITE_OTHER): Payer: Medicare Other

## 2021-04-04 DIAGNOSIS — I480 Paroxysmal atrial fibrillation: Secondary | ICD-10-CM | POA: Diagnosis not present

## 2021-04-09 LAB — CUP PACEART REMOTE DEVICE CHECK
Date Time Interrogation Session: 20220901005205
Implantable Pulse Generator Implant Date: 20190909

## 2021-04-16 NOTE — Progress Notes (Signed)
[facility] Summary Report / Loop Recorder 

## 2021-04-19 ENCOUNTER — Ambulatory Visit (INDEPENDENT_AMBULATORY_CARE_PROVIDER_SITE_OTHER): Payer: Medicare Other | Admitting: Family Medicine

## 2021-04-19 ENCOUNTER — Other Ambulatory Visit: Payer: Self-pay

## 2021-04-19 ENCOUNTER — Encounter: Payer: Self-pay | Admitting: Family Medicine

## 2021-04-19 VITALS — BP 108/70 | HR 62 | Temp 98.5°F | Ht 73.75 in | Wt 178.9 lb

## 2021-04-19 DIAGNOSIS — I10 Essential (primary) hypertension: Secondary | ICD-10-CM

## 2021-04-19 DIAGNOSIS — Z23 Encounter for immunization: Secondary | ICD-10-CM | POA: Diagnosis not present

## 2021-04-19 DIAGNOSIS — Z85828 Personal history of other malignant neoplasm of skin: Secondary | ICD-10-CM | POA: Diagnosis not present

## 2021-04-19 DIAGNOSIS — R413 Other amnesia: Secondary | ICD-10-CM

## 2021-04-19 DIAGNOSIS — I700 Atherosclerosis of aorta: Secondary | ICD-10-CM

## 2021-04-19 DIAGNOSIS — I480 Paroxysmal atrial fibrillation: Secondary | ICD-10-CM

## 2021-04-19 DIAGNOSIS — R17 Unspecified jaundice: Secondary | ICD-10-CM

## 2021-04-19 DIAGNOSIS — R351 Nocturia: Secondary | ICD-10-CM

## 2021-04-19 DIAGNOSIS — N401 Enlarged prostate with lower urinary tract symptoms: Secondary | ICD-10-CM

## 2021-04-19 DIAGNOSIS — E7800 Pure hypercholesterolemia, unspecified: Secondary | ICD-10-CM

## 2021-04-19 DIAGNOSIS — Z0000 Encounter for general adult medical examination without abnormal findings: Secondary | ICD-10-CM

## 2021-04-19 LAB — COMPREHENSIVE METABOLIC PANEL
ALT: 18 U/L (ref 0–53)
AST: 22 U/L (ref 0–37)
Albumin: 4.2 g/dL (ref 3.5–5.2)
Alkaline Phosphatase: 50 U/L (ref 39–117)
BUN: 16 mg/dL (ref 6–23)
CO2: 31 mEq/L (ref 19–32)
Calcium: 9.5 mg/dL (ref 8.4–10.5)
Chloride: 100 mEq/L (ref 96–112)
Creatinine, Ser: 0.91 mg/dL (ref 0.40–1.50)
GFR: 82.36 mL/min (ref >60.00)
Glucose, Bld: 87 mg/dL (ref 70–99)
Potassium: 4.4 mEq/L (ref 3.5–5.1)
Sodium: 140 mEq/L (ref 135–145)
Total Bilirubin: 1.7 mg/dL — ABNORMAL HIGH (ref 0.2–1.2)
Total Protein: 6.5 g/dL (ref 6.0–8.3)

## 2021-04-19 LAB — CBC WITH DIFFERENTIAL/PLATELET
Basophils Absolute: 0 10*3/uL (ref 0.0–0.1)
Basophils Relative: 0.5 % (ref 0.0–3.0)
Eosinophils Absolute: 0.1 10*3/uL (ref 0.0–0.7)
Eosinophils Relative: 2.3 % (ref 0.0–5.0)
HCT: 46.9 % (ref 39.0–52.0)
Hemoglobin: 15.6 g/dL (ref 13.0–17.0)
Lymphocytes Relative: 28.7 % (ref 12.0–46.0)
Lymphs Abs: 1.7 10*3/uL (ref 0.7–4.0)
MCHC: 33.2 g/dL (ref 30.0–36.0)
MCV: 93.9 fl (ref 78.0–100.0)
Monocytes Absolute: 0.5 10*3/uL (ref 0.1–1.0)
Monocytes Relative: 8.3 % (ref 3.0–12.0)
Neutro Abs: 3.6 10*3/uL (ref 1.4–7.7)
Neutrophils Relative %: 60.2 % (ref 43.0–77.0)
Platelets: 214 10*3/uL (ref 150.0–400.0)
RBC: 4.99 Mil/uL (ref 4.22–5.81)
RDW: 13.7 % (ref 11.5–15.5)
WBC: 6 10*3/uL (ref 4.0–10.5)

## 2021-04-19 LAB — LIPID PANEL
Cholesterol: 203 mg/dL — ABNORMAL HIGH (ref 0–200)
HDL: 62.1 mg/dL (ref >39.00)
LDL Cholesterol: 124 mg/dL — ABNORMAL HIGH (ref 0–99)
NonHDL: 140.58
Total CHOL/HDL Ratio: 3
Triglycerides: 82 mg/dL (ref 0.0–149.0)
VLDL: 16.4 mg/dL (ref 0.0–40.0)

## 2021-04-19 LAB — TSH: TSH: 2.08 u[IU]/mL (ref 0.35–5.50)

## 2021-04-19 LAB — VITAMIN B12: Vitamin B-12: 440 pg/mL (ref 211–911)

## 2021-04-19 NOTE — Progress Notes (Signed)
Garrett Vargas DOB: 1945/10/06 Encounter date: 04/19/2021  This is a 75 y.o. male who presents for complete physical   History of present illness/Additional concerns:  Memory doesn't feel as great as it did in the past - sometimes forgets route of getting somewhere. Thinks just not paying attention. No one else commenting on memory except wife. Forgets why going into room. No mood issues. No problems with checkbook/bills. No issues with headaches, visual changes. Has been very busy with cleaning out house for full kitchen renovation.   Heart rate feels fine. Has loop recorder and follows with cardiology regularly. On xarelto. No abnormalities in over a year.   Playing golf, staying busy. Was going to health club but has been closed for renovations and plans to start back next week.   Dr. Watt Climes told him 3 years based on report from colonoscopy with mansouraty although mansouraty suggested repeat in 1 year (11/2021)  Atrovent was started for clear nasal discharge with eating. Has slowed it down.     Past Medical History:  Diagnosis Date   A-fib ([place])    ALLERGIC RHINITIS    Arthritis    Colon polyp    Dysrhythmia    Erectile dysfunction    History of bronchitis as a child    Hyperlipemia    Hypertension    S/P lateral meniscectomy of left knee    Past Surgical History:  Procedure Laterality Date   CARDIOVERSION N/A 03/17/2014   Procedure: CARDIOVERSION;  Surgeon: Sanda Klein, MD;  Location: [facility];  Service: Cardiovascular;  Laterality: N/A;   COLONOSCOPY WITH PROPOFOL N/A 11/19/2020   Procedure: COLONOSCOPY WITH PROPOFOL;  Surgeon: Irving Copas., MD;  Location: [facility];  Service: Gastroenterology;  Laterality: N/A;   colonscopy      ELBOW ARTHROSCOPY Bilateral    ENDOSCOPIC MUCOSAL RESECTION N/A 11/19/2020   Procedure: ENDOSCOPIC MUCOSAL RESECTION;  Surgeon: [name] Telford Nab., MD;  Location: [facility];  Service: Gastroenterology;  Laterality: N/A;    HEMOSTASIS CLIP PLACEMENT  11/19/2020   Procedure: HEMOSTASIS CLIP PLACEMENT;  Surgeon: Irving Copas., MD;  Location: [facility];  Service: Gastroenterology;;   LOOP RECORDER INSERTION N/A 04/12/2018   Procedure: LOOP RECORDER INSERTION;  Surgeon: Evans Lance, MD;  Location: [facility];  Service: Cardiovascular;  Laterality: N/A;   Multiple Knee,elbow and foot surgeries     neuroma left foot,2010, Dr. Anne Fu     POLYPECTOMY  11/19/2020   Procedure: POLYPECTOMY;  Surgeon: [name] Telford Nab., MD;  Location: [facility];  Service: Gastroenterology;;   Maryagnes Amos INJECTION  11/19/2020   Procedure: SUBMUCOSAL LIFTING INJECTION;  Surgeon: Irving Copas., MD;  Location: [facility];  Service: Gastroenterology;;   TEE WITHOUT CARDIOVERSION N/A 03/17/2014   Procedure: TRANSESOPHAGEAL ECHOCARDIOGRAM (TEE);  Surgeon: Sanda Klein, MD;  Location: [facility];  Service: Cardiovascular;  Laterality: N/A;   TOTAL HIP ARTHROPLASTY Left 07/18/2015   Procedure: LEFT TOTAL HIP ARTHROPLASTY ANTERIOR APPROACH;  Surgeon: Gaynelle Arabian, MD;  Location: [facility];  Service: Orthopedics;  Laterality: Left;   No Known Allergies Current Meds  Medication Sig   atenolol (TENORMIN) 25 MG tablet TAKE ONE-HALF TABLET BY  MOUTH DAILY. MAY TAKE AN  EXTRA ONE-HALF TABLET DAILY FOR PALPITATIONS OR HEART  RACING. (Patient taking differently: Take 25 mg by mouth daily.)   atorvastatin (LIPITOR) 20 MG tablet TAKE 1 TABLET BY MOUTH  DAILY   flecainide (TAMBOCOR) 50 MG tablet TAKE 1 TABLET BY MOUTH  TWICE DAILY (Patient taking  differently: Take 50 mg by mouth 2 (two) times daily.)   ipratropium (ATROVENT) 0.03 % nasal spray Place 1 spray into both nostrils daily.   Multiple Vitamins-Minerals (CENTRUM SILVER PO) Take 1 capsule by mouth daily.   niacin 500 MG tablet Take 500 mg by mouth at bedtime. SR   Omega-3 Fatty Acids (FISH OIL PO) Take 1,040 mg by mouth daily.   omeprazole  (PRILOSEC) 20 MG capsule Take 20 mg by mouth daily.   rivaroxaban (XARELTO) 20 MG TABS tablet Take 1 tablet (20 mg total) by mouth daily with supper.   tadalafil (CIALIS) 5 MG tablet Take 5 mg by mouth daily as needed for erectile dysfunction.   Social History   Tobacco Use   Smoking status: Former    Packs/day: 0.50    Years: 10.00    Pack years: 5.00    Types: Cigarettes    Quit date: 08/05/1987    Years since quitting: 33.7   Smokeless tobacco: Never  Substance Use Topics   Alcohol use: Yes    Alcohol/week: 0.0 standard drinks    Comment: occasional   Family History  Problem Relation Age of Onset   Coronary artery disease Other    Prostate cancer Other    Stroke Other    Heart disease Mother    Stroke Mother 2   Prostate cancer Father 41   Kidney disease Sister      Review of Systems  Constitutional:  Negative for activity change, appetite change, chills, fatigue, fever and unexpected weight change.  HENT:  Negative for congestion, ear pain, hearing loss, sinus pressure, sinus pain, sore throat and trouble swallowing.   Eyes:  Negative for pain and visual disturbance.  Respiratory:  Negative for cough, chest tightness, shortness of breath and wheezing.   Cardiovascular:  Negative for chest pain, palpitations and leg swelling.  Gastrointestinal:  Negative for abdominal distention, abdominal pain, blood in stool, constipation, diarrhea, nausea and vomiting.  Genitourinary:  Negative for decreased urine volume, difficulty urinating, dysuria, penile pain and testicular pain.  Musculoskeletal:  Negative for arthralgias, back pain and joint swelling.  Skin:  Negative for rash.  Neurological:  Negative for dizziness, weakness, numbness and headaches.  Hematological:  Negative for adenopathy. Does not bruise/bleed easily.  Psychiatric/Behavioral:  Negative for agitation, sleep disturbance and suicidal ideas. The patient is not nervous/anxious.        See HPI   CBC:  Lab  Results  Component Value Date   WBC 6.0 04/19/2021   HGB 15.6 04/19/2021   HCT 46.9 04/19/2021   MCH 31.8 03/14/2020   MCHC 33.2 04/19/2021   RDW 13.7 04/19/2021   PLT 214.0 04/19/2021   MPV 10.3 03/14/2020   CMP: Lab Results  Component Value Date   NA 140 04/19/2021   K 4.4 04/19/2021   CL 100 04/19/2021   CO2 31 04/19/2021   ANIONGAP 8 10/11/2018   GLUCOSE 87 04/19/2021   BUN 16 04/19/2021   CREATININE 0.91 04/19/2021   CREATININE 0.90 03/14/2020   GFRAA >60 10/11/2018   CALCIUM 9.5 04/19/2021   PROT 6.5 04/19/2021   BILITOT 1.7 (H) 04/19/2021   ALKPHOS 50 04/19/2021   ALT 18 04/19/2021   AST 22 04/19/2021   LIPID: Lab Results  Component Value Date   CHOL 203 (H) 04/19/2021   TRIG 82.0 04/19/2021   HDL 62.10 04/19/2021   LDLCALC 124 (H) 04/19/2021   LDLCALC 117 (H) 03/14/2020    Objective:  BP 108/70 (BP Location:  Left Arm, Patient Position: Sitting, Cuff Size: Normal)   Pulse 62   Temp 98.5 F (36.9 C) (Oral)   Ht 6' 1.75" (1.873 m)   Wt 178 lb 14.4 oz (81.1 kg)   SpO2 95%   BMI 23.13 kg/m   Weight: 178 lb 14.4 oz (81.1 kg)   BP Readings from Last 3 Encounters:  04/19/21 108/70  11/19/20 136/88  11/12/20 122/72   Wt Readings from Last 3 Encounters:  04/19/21 178 lb 14.4 oz (81.1 kg)  11/14/20 182 lb (82.6 kg)  11/12/20 186 lb (84.4 kg)    Physical Exam Constitutional:      General: He is not in acute distress.    Appearance: He is well-developed.  HENT:     Head: Normocephalic and atraumatic.     Right Ear: External ear normal.     Left Ear: External ear normal.     Nose: Nose normal.     Mouth/Throat:     Pharynx: No oropharyngeal exudate.  Eyes:     Conjunctiva/sclera: Conjunctivae normal.     Pupils: Pupils are equal, round, and reactive to light.  Neck:     Thyroid: No thyromegaly.  Cardiovascular:     Rate and Rhythm: Normal rate and regular rhythm.     Heart sounds: Normal heart sounds. No murmur heard.   No friction rub. No  gallop.  Pulmonary:     Effort: Pulmonary effort is normal. No respiratory distress.     Breath sounds: Normal breath sounds. No stridor. No wheezing or rales.  Abdominal:     General: Bowel sounds are normal.     Palpations: Abdomen is soft.  Musculoskeletal:        General: Normal range of motion.     Cervical back: Neck supple.  Skin:    General: Skin is warm and dry.  Neurological:     Mental Status: He is alert and oriented to person, place, and time.     Comments: [place] cognitive exam is scanned in.  I am not going add to the screening template, because patient did not hear me when I said that he would have to recall the words he was remembering, so score is lower than I believe it would be otherwise.  He did mark the time incorrectly on his clock drawing, but otherwise proportionate alignment are in check for his clock. Additionally missed a line on his cube drawing. He as able to recall all 5 words correctly with multiple choice cue.   Psychiatric:        Behavior: Behavior normal.        Thought Content: Thought content normal.        Judgment: Judgment normal.    Assessment/Plan: Health Maintenance Due  Topic Date Due   COVID-19 Vaccine (5 - Booster for Pfizer series) 03/15/2021   Health Maintenance reviewed.  1. Preventative health care Can consider COVID booster next month.  He has had 4 shots already.  Flu shot given.  2. Essential hypertension, benign Blood pressures been well controlled.  Follows with cardiology.  Continue with current medications. - CBC with Differential/Platelet; Future - Comprehensive metabolic panel; Future - Comprehensive metabolic panel - CBC with Differential/Platelet  3. Paroxysmal atrial fibrillation (HCC) Has been rate controlled.  Follows with cardiology.  4. Aortic atherosclerosis (HCC) On Lipitor 20 mg daily, niacin 500 mg daily.  Recheck blood work today.  5. Pure hypercholesterolemia See above. - Lipid panel; Future -  Lipid panel  6.  History of skin cancer Follows regularly with dermatology.  7. BPH associated with nocturia Symptoms well controlled.  8. Memory change [place] cognitive exam today was stable.  See note.  We will start with blood work.  If memory concerns continue, would suggest sending for neurocognitive testing, but we did discuss mindful activities and focusing on conversations and being present. - Vitamin B12; Future - TSH; Future - ANA; Future - ANA - TSH - Vitamin B12  9. Need for immunization against influenza - Flu Vaccine QUAD High Dose(Fluad)   Return in about 6 months (around 10/17/2021).  Micheline Rough, MD

## 2021-04-22 LAB — ANA: Anti Nuclear Antibody (ANA): NEGATIVE

## 2021-04-22 MED ORDER — ATORVASTATIN CALCIUM 40 MG PO TABS: 40.0000 mg | ORAL_TABLET | Freq: Every day | ORAL | 2 refills | Status: DC

## 2021-04-22 NOTE — Addendum Note (Signed)
Addended by: Agnes Lawrence on: 04/22/2021 02:34 PM   Modules accepted: Orders

## 2021-05-02 DIAGNOSIS — H527 Unspecified disorder of refraction: Secondary | ICD-10-CM | POA: Diagnosis not present

## 2021-05-02 DIAGNOSIS — H2513 Age-related nuclear cataract, bilateral: Secondary | ICD-10-CM | POA: Diagnosis not present

## 2021-05-09 LAB — CUP PACEART REMOTE DEVICE CHECK
Date Time Interrogation Session: 20221004005237
Implantable Pulse Generator Implant Date: 20190909

## 2021-05-13 ENCOUNTER — Ambulatory Visit (INDEPENDENT_AMBULATORY_CARE_PROVIDER_SITE_OTHER): Payer: Medicare Other

## 2021-05-13 ENCOUNTER — Telehealth: Payer: Self-pay | Admitting: Pharmacist

## 2021-05-13 DIAGNOSIS — I480 Paroxysmal atrial fibrillation: Secondary | ICD-10-CM

## 2021-05-13 NOTE — Chronic Care Management (AMB) (Signed)
    Chronic Care Management Pharmacy Assistant   Name: Garrett Vargas  MRN: [id number] DOB: Jun 10, 1946  05/14/21 APPOINTMENT [name], No answer, left message of appointment on 05/14/21 at 11am via telephone visit with Jeni Salles, Pharm D. Notified to have all medications, supplements, blood pressure and/or blood sugar logs available during appointment and to return call if need to reschedule.   Care Gaps:  AWV - completed on 11/12/20 Covid-19 vaccine booster 5 - overdue since 03/15/21  Star Rating Drug:  Atorvastatin 40mg  - last filled on 02/28/21 [facility] at [facility]   Any gaps in medications fill history? No.  [place]  Clinical Pharmacist Assistant [phone]

## 2021-05-14 ENCOUNTER — Ambulatory Visit (INDEPENDENT_AMBULATORY_CARE_PROVIDER_SITE_OTHER): Payer: Medicare Other | Admitting: Pharmacist

## 2021-05-14 DIAGNOSIS — I10 Essential (primary) hypertension: Secondary | ICD-10-CM

## 2021-05-14 DIAGNOSIS — E7800 Pure hypercholesterolemia, unspecified: Secondary | ICD-10-CM

## 2021-05-14 NOTE — Progress Notes (Signed)
Chronic Care Management Pharmacy Note  06/03/2021 Name:  Garrett Vargas MRN:  [id number] DOB:  09-27-1945  Summary: LDL not at goal < 100   Recommendations/Changes made from today's visit: -Recommended stopping niacin due to drug interaction with statins -Consider stopping fish oil depending on results of lipid panel   Plan: Repeat lipid panel in 2-4 months HLD assessment after repeat lipid panel  Subjective: Garrett Vargas is an 75 y.o. year old male who is a primary patient of Koberlein, Steele Berg, MD.  The CCM team was consulted for assistance with disease management and care coordination needs.    Engaged with patient by telephone for follow up visit in response to provider referral for pharmacy case management and/or care coordination services.   Consent to Services:  The patient was given information about Chronic Care Management services, agreed to services, and gave verbal consent prior to initiation of services.  Please see initial visit note for detailed documentation.   Patient Care Team: Caren Macadam, MD as PCP - General (Family Medicine) Macario Carls, MD as Referring Physician (Dermatology) Viona Gilmore, [facility] as Pharmacist (Pharmacist)  Recent office visits: 04/19/21 Micheline Rough, MD: Patient presented for annual exam. LDL increased. Recommended alternating atorvastatin 40 mg with 20 mg. Influenza vaccine administered. Patient will schedule lab appt in 3-6 months for repeat lipid panel.  11/12/20 [name], LPN: Patient presented for AWV.  10/05/20 Micheline Rough, MD: Patient presented for chronic conditions follow up.   Recent consult visits: 03/25/21 Daryll Brod, MD (ortho): Patient presented for follow up for trigger finger.   12/28/20 Daryll Brod, MD (ortho): Patient presented for follow up for trigger finger.   11/02/20 Daryll Brod, MD (ortho): Patient presented for follow up for trigger finger.   07/09/20 [name], MD (cardiology):  Patient presented for Afib follow up.   Hospital visits: 11/19/20 Patient presented to [facility] for colonoscopy.  Objective:  Lab Results  Component Value Date   CREATININE 0.91 04/19/2021   BUN 16 04/19/2021   GFR 82.36 04/19/2021   GFRNONAA >60 10/11/2018   GFRAA >60 10/11/2018   NA 140 04/19/2021   K 4.4 04/19/2021   CALCIUM 9.5 04/19/2021   CO2 31 04/19/2021   GLUCOSE 87 04/19/2021    Lab Results  Component Value Date/Time   HGBA1C 5.5 01/17/2019 10:02 AM   HGBA1C 5.5 03/02/2013 08:48 AM   GFR 82.36 04/19/2021 10:51 AM   GFR 89.47 01/17/2019 10:02 AM    Last diabetic Eye exam: No results found for: HMDIABEYEEXA  Last diabetic Foot exam: No results found for: HMDIABFOOTEX   Lab Results  Component Value Date   CHOL 203 (H) 04/19/2021   HDL 62.10 04/19/2021   LDLCALC 124 (H) 04/19/2021   TRIG 82.0 04/19/2021   CHOLHDL 3 04/19/2021    Hepatic Function Latest Ref Rng & Units 04/19/2021 03/14/2020 01/17/2019  Total Protein 6.0 - 8.3 g/dL 6.5 6.5 6.4  Albumin 3.5 - 5.2 g/dL 4.2 - 4.2  AST 0 - 37 U/L 22 22 22   ALT 0 - 53 U/L 18 19 23   Alk Phosphatase 39 - 117 U/L 50 - 50  Total Bilirubin 0.2 - 1.2 mg/dL 1.7(H) 1.3(H) 1.2  Bilirubin, Direct 0.0 - 0.3 mg/dL - - -    Lab Results  Component Value Date/Time   TSH 2.08 04/19/2021 10:51 AM   TSH 2.47 07/15/2018 07:49 AM    CBC Latest Ref Rng & Units 04/19/2021 03/14/2020 01/17/2019  WBC 4.0 - 10.5 K/uL 6.0 5.9 7.0  Hemoglobin 13.0 - 17.0 g/dL 15.6 16.0 16.0  Hematocrit 39.0 - 52.0 % 46.9 48.1 48.6  Platelets 150.0 - 400.0 K/uL 214.0 223 224.0    No results found for: VD25OH  Clinical ASCVD: Yes  The 10-year ASCVD risk score (Arnett DK, et al., 2019) is: 20.5%   Values used to calculate the score:     Age: 35 years     Sex: Male     Is Non-Hispanic African American: No     Diabetic: No     Tobacco smoker: No     Systolic Blood Pressure: 035 mmHg     Is BP treated: Yes     HDL Cholesterol:  62.1 mg/dL     Total Cholesterol: 203 mg/dL    Depression screen [facility] 2/9 04/19/2021 11/12/2020 03/14/2020  Decreased Interest 0 0 0  Down, Depressed, Hopeless 0 0 0  PHQ - 2 Score 0 0 0  Altered sleeping 1 - -  Tired, decreased energy 0 - -  Change in appetite 0 - -  Feeling bad or failure about yourself  0 - -  Trouble concentrating 0 - -  Moving slowly or fidgety/restless 0 - -  Suicidal thoughts 0 - -  PHQ-9 Score 1 - -     CHA2DS2/VAS Stroke Risk Points  Current as of 11 minutes ago     3 >= 2 Points: High Risk  1 - 1.99 Points: Medium Risk  0 Points: Low Risk    Last Change:       Details    This score determines the patient's risk of having a stroke if the  patient has atrial fibrillation.       Points Metrics  0 Has Congestive Heart Failure:  No    Current as of 11 minutes ago  0 Has Vascular Disease:  No    Current as of 11 minutes ago  1 Has Hypertension:  Yes    Current as of 11 minutes ago  2 Age:  58    Current as of 11 minutes ago  0 Has Diabetes:  No    Current as of 11 minutes ago  0 Had Stroke:  No  Had TIA:  No  Had Thromboembolism:  No    Current as of 11 minutes ago  0 Male:  No    Current as of 11 minutes ago      Social History   Tobacco Use  Smoking Status Former   Packs/day: 0.50   Years: 10.00   Pack years: 5.00   Types: Cigarettes   Quit date: 08/05/1987   Years since quitting: 33.8  Smokeless Tobacco Never   BP Readings from Last 3 Encounters:  04/19/21 108/70  11/19/20 136/88  11/12/20 122/72   Pulse Readings from Last 3 Encounters:  04/19/21 62  11/19/20 (!) 55  11/12/20 (!) 59   Wt Readings from Last 3 Encounters:  04/19/21 178 lb 14.4 oz (81.1 kg)  11/14/20 182 lb (82.6 kg)  11/12/20 186 lb (84.4 kg)   BMI Readings from Last 3 Encounters:  04/19/21 23.13 kg/m  11/14/20 23.37 kg/m  11/12/20 23.88 kg/m    Assessment/Interventions: Review of patient past medical history, allergies, medications, health status,  including review of consultants reports, laboratory and other test data, was performed as part of comprehensive evaluation and provision of chronic care management services.   SDOH:  (Social Determinants of Health) assessments and interventions performed:  Yes   SDOH Screenings   Alcohol Screen: Low Risk    Last Alcohol Screening Score (AUDIT): 4  Depression (PHQ2-9): Low Risk    PHQ-2 Score: 1  Financial Resource Strain: Low Risk    Difficulty of Paying Living Expenses: Not hard at all  Food Insecurity: No Food Insecurity   Worried About Charity fundraiser in the Last Year: Never true   Ran Out of Food in the Last Year: Never true  Housing: Not on file  Physical Activity: Sufficiently Active   Days of Exercise per Week: 5 days   Minutes of Exercise per Session: 30 min  Social Connections: Moderately Integrated   Frequency of Communication with Friends and Family: More than three times a week   Frequency of Social Gatherings with Friends and Family: More than three times a week   Attends Religious Services: Never   Marine scientist or Organizations: Yes   Attends Music therapist: More than 4 times per year   Marital Status: Married  Stress: No Stress Concern Present   Feeling of Stress : Not at all  Tobacco Use: Medium Risk   Smoking Tobacco Use: Former   Smokeless Tobacco Use: Never   Passive Exposure: Not on Pensions consultant Needs: No Transportation Needs   Lack of Transportation (Medical): No   Lack of Transportation (Non-Medical): No   CCM Care Plan  No Known Allergies  Medications Reviewed Today     Reviewed by Agnes Lawrence, CMA (Certified Medical Assistant) on 04/19/21 at [place] List Status: <None>   Medication Order Taking? Sig Documenting Provider Last Dose Status Informant  atenolol (TENORMIN) 25 MG tablet [id number] Yes TAKE ONE-HALF TABLET BY  MOUTH DAILY. MAY TAKE AN  EXTRA ONE-HALF TABLET DAILY FOR PALPITATIONS OR HEART  RACING.   Patient taking differently: Take 25 mg by mouth daily.   Evans Lance, MD Taking Active Self  atorvastatin (LIPITOR) 20 MG tablet [id number] Yes TAKE 1 TABLET BY MOUTH  DAILY Koberlein, Junell C, MD Taking Active   flecainide (TAMBOCOR) 50 MG tablet [id number] Yes TAKE 1 TABLET BY MOUTH  TWICE DAILY  Patient taking differently: Take 50 mg by mouth 2 (two) times daily.   Evans Lance, MD Taking Active Self  ipratropium (ATROVENT) 0.03 % nasal spray [id number] Yes Place 1 spray into both nostrils daily. [provider] Taking Active Self  Multiple Vitamins-Minerals (CENTRUM SILVER PO) [id number] Yes Take 1 capsule by mouth daily. [provider] Taking Active Self  niacin 500 MG tablet [id number] Yes Take 500 mg by mouth at bedtime. SR [provider] Taking Active Self  Omega-3 Fatty Acids (FISH OIL PO) [id number] Yes Take 1,040 mg by mouth daily. [provider] Taking Active Self  omeprazole (PRILOSEC) 20 MG capsule [id number] Yes Take 20 mg by mouth daily. [provider] Taking Active Self  rivaroxaban (XARELTO) 20 MG TABS tablet [id number] Yes Take 1 tablet (20 mg total) by mouth daily with supper. Mansouraty, Telford Nab., MD Taking Active   tadalafil (CIALIS) 5 MG tablet [id number] Yes Take 5 mg by mouth daily as needed for erectile dysfunction. 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 07/17/2019   Basal cell carcinoma (BCC) of neck 07/17/2019   Syncope  and collapse 04/12/2018   BPH associated with nocturia 04/06/2014   HEMATURIA, HX OF 01/04/2009   ESSENTIAL HYPERTENSION, BENIGN 12/04/2008   Hyperlipemia 01/03/2008   ERECTILE DYSFUNCTION, MILD 01/03/2008   Allergic rhinitis 01/03/2008   Paroxysmal atrial fibrillation ([place]) 04/20/2007    Immunization History   Administered Date(s) Administered   Fluad Quad(high Dose 65+) 04/19/2021   Hepatitis A 07/02/2012, 01/10/2013   Influenza Nasal 05/04/2017   Influenza Split 04/27/2012   Influenza Whole 05/04/2009   Influenza, High Dose Seasonal PF 06/04/2018   Influenza,inj,Quad PF,6+ Mos 05/05/2019   Influenza-Unspecified 05/04/2014, 05/07/2015, 04/28/2016, 05/04/2020   PFIZER(Purple Top)SARS-COV-2 Vaccination 08/17/2019, 09/05/2019, 04/12/2020, 11/13/2020   PPD Test 04/16/2011, 04/27/2012, 04/27/2013, 04/11/2014   Pneumococcal Conjugate-13 04/06/2014   Pneumococcal Polysaccharide-23 12/10/2010   Td 01/08/2005   Tdap 05/09/2015   Zoster Recombinat (Shingrix) 09/02/2018, 02/23/2019   Zoster, Live 08/13/2009   Patient has cut back on wine and eating out. He has noticed some muscle pain but no change in it recently. He has been eating some read meat and usually salad and more fish but not fried. He is also eating more fruits and vegetables, especially strawberries and blueberries. He does not eat many eggs.   Conditions to be addressed/monitored:  Hypertension, Hyperlipidemia, Atrial Fibrillation, BPH and Allergic Rhinitis  Conditions addressed this visit: Hypertension, Hyperlipidemia, Atrial fibrillation  Care Plan : CCM Pharmacy Care Plan  Updates made by Viona Gilmore, [place] since 06/03/2021 12:00 AM     Problem: Problem: Hypertension, Hyperlipidemia, Atrial Fibrillation, BPH and Allergic Rhinitis      Long-Range Goal: Patient-Specific Goal   Start Date: 11/14/2020  Expected End Date: 11/14/2021  Recent Progress: On track  Priority: High  Note:   Current Barriers:  Unable to achieve control of cholesterol   Pharmacist Clinical Goal(s):  Patient will achieve control of cholesterol as evidenced by next lipid panel  through collaboration with PharmD and provider.   Interventions: 1:1 collaboration with Caren Macadam, MD regarding development and update of comprehensive plan of  care as evidenced by provider attestation and co-signature Inter-disciplinary care team collaboration (see longitudinal plan of care) Comprehensive medication review performed; medication list updated in electronic medical record  Hypertension (BP goal <130/80) -Controlled -Current treatment: Atenolol 25 mg 1 tablet daily -Medications previously tried: none  -Current home readings: 110-120/70s (1-2 times a week) -Current dietary habits: rarely adds salt and wife tries to find foods with lower salt content by looking at package labels -Current exercise habits: active almost every day; playing golf and going to [employer] (not been in normal routine) -Denies hypotensive/hypertensive symptoms -Educated on Exercise goal of 150 minutes per week; Importance of home blood pressure monitoring; Proper BP monitoring technique; -Counseled to monitor BP at home weekly, document, and provide log at future appointments -Counseled on diet and exercise extensively Recommended to continue current medication  Hyperlipidemia: (LDL goal < 100) -Uncontrolled -Current treatment: Atorvastatin 20 mg 1 tablet daily Fish oil 1000 mg 1 capsule daily Niacin 500 mg 1 tablet at bedtime -Medications previously tried: none  -Current dietary patterns: limits fried foods -Current exercise habits: active almost daily -Educated on Cholesterol goals;  Benefits of statin for ASCVD risk reduction; Importance of limiting foods high in cholesterol; -Counseled on diet and exercise extensively Recommended to continue current medication Counseled on interaction with niacin and atorvastatin Recommended stopping niacin due to interaction.  Atrial Fibrillation (Goal: prevent stroke and major bleeding) -Controlled -CHADSVASC: 3 -Current treatment: Rate/rhythm control: atenolol 25 mg  1 tablet daily; flecainide 50 mg 1 table twice daily Anticoagulation: Xarelto 20 mg 1 tablet daily with supper -Medications  previously tried: n/a -Home BP and HR readings: HR 70-80s  -Counseled on bleeding risk associated with Xarelto and importance of self-monitoring for signs/symptoms of bleeding; avoidance of NSAIDs due to increased bleeding risk with anticoagulants; -Recommended to continue current medication  GERD (Goal: minimize symptoms) -Controlled -Current treatment  Omeprazole 20 mg 1 capsule daily -Medications previously tried: none  -Counseled on non-pharmacologic management of symptoms such as elevating the head of your bed, avoiding eating 2-3 hours before bed, avoiding triggering foods such as acidic, spicy, or fatty foods, eating smaller meals, and wearing clothes that are loose around the waist  ED (Goal: minimize symptoms) -Controlled -Current treatment  tadalafil 5 mg 1 tablet daily as needed  -Medications previously tried: none  -Recommended to continue current medication  Health Maintenance -Vaccine gaps: none -Current therapy:  Ipratropium 0.03% nasal spray 1 spray in both nostrils daily Multivitamin 1 tablet daily -Educated on Cost vs benefit of each product must be carefully weighed by individual consumer -Patient is satisfied with current therapy and denies issues -Recommended to continue current medication  Patient Goals/Self-Care Activities Patient will:  - check blood pressure weekly, document, and provide at future appointments target a minimum of 150 minutes of moderate intensity exercise weekly engage in dietary modifications by increasing fiber intake  Follow Up Plan: Telephone follow up appointment with care management team member scheduled for: 6 months        Medication Assistance: None required.  Patient affirms current coverage meets needs.  Compliance/Adherence/Medication fill history: Care Gaps: COVID booster Last office BP: 108/70  Star-Rating Drugs: Atorvastatin 73m - last filled on 02/28/21 90DS [facility]  [facility]  [facility] [place]Phone: [phone]: [phone] Uses pill box? Yes - in AM/PM Pt endorses 99% compliance - every 3-4 months  We discussed: Current pharmacy is preferred with insurance plan and patient is satisfied with pharmacy services Patient decided to: Continue current medication management strategy  Care Plan and Follow Up Patient Decision:  Patient agrees to Care Plan and Follow-up.  Plan: Telephone follow up appointment with care management team member scheduled for:  6 months  MJeni Salles PharmD BGilliamPharmacist LLeavenworthat [phone]

## 2021-05-21 NOTE — Progress Notes (Signed)
[facility] Summary Report / Loop Recorder 

## 2021-05-27 ENCOUNTER — Other Ambulatory Visit: Payer: Medicare Other

## 2021-06-03 DIAGNOSIS — I10 Essential (primary) hypertension: Secondary | ICD-10-CM

## 2021-06-03 DIAGNOSIS — E7800 Pure hypercholesterolemia, unspecified: Secondary | ICD-10-CM | POA: Diagnosis not present

## 2021-06-03 NOTE — Patient Instructions (Signed)
Hi Mitul,   It was great to get to catch up with you again! Don't forget to hold off on the niacin for right now.  Please reach out to me if you have any questions or need anything before our follow up!  Best, Maddie  Jeni Salles, PharmD, [place] [facility] [place]    Visit Information   Goals Addressed   None    Patient Care Plan: CCM Pharmacy Care Plan     Problem Identified: Problem: Hypertension, Hyperlipidemia, Atrial Fibrillation, BPH and Allergic Rhinitis      Long-Range Goal: Patient-Specific Goal   Start Date: 11/14/2020  Expected End Date: 11/14/2021  Recent Progress: On track  Priority: High  Note:   Current Barriers:  Unable to achieve control of cholesterol   Pharmacist Clinical Goal(s):  Patient will achieve control of cholesterol as evidenced by next lipid panel  through collaboration with PharmD and provider.   Interventions: 1:1 collaboration with Caren Macadam, MD regarding development and update of comprehensive plan of care as evidenced by provider attestation and co-signature Inter-disciplinary care team collaboration (see longitudinal plan of care) Comprehensive medication review performed; medication list updated in electronic medical record  Hypertension (BP goal <130/80) -Controlled -Current treatment: Atenolol 25 mg 1 tablet daily -Medications previously tried: none  -Current home readings: 110-120/70s (1-2 times a week) -Current dietary habits: rarely adds salt and wife tries to find foods with lower salt content by looking at package labels -Current exercise habits: active almost every day; playing golf and going to [employer] (not been in normal routine) -Denies hypotensive/hypertensive symptoms -Educated on Exercise goal of 150 minutes per week; Importance of home blood pressure monitoring; Proper BP monitoring technique; -Counseled to monitor BP at home weekly,  document, and provide log at future appointments -Counseled on diet and exercise extensively Recommended to continue current medication  Hyperlipidemia: (LDL goal < 100) -Uncontrolled -Current treatment: Atorvastatin 20 mg 1 tablet daily Fish oil 1000 mg 1 capsule daily Niacin 500 mg 1 tablet at bedtime -Medications previously tried: none  -Current dietary patterns: limits fried foods -Current exercise habits: active almost daily -Educated on Cholesterol goals;  Benefits of statin for ASCVD risk reduction; Importance of limiting foods high in cholesterol; -Counseled on diet and exercise extensively Recommended to continue current medication Counseled on interaction with niacin and atorvastatin Recommended stopping niacin due to interaction.  Atrial Fibrillation (Goal: prevent stroke and major bleeding) -Controlled -CHADSVASC: 3 -Current treatment: Rate/rhythm control: atenolol 25 mg 1 tablet daily; flecainide 50 mg 1 table twice daily Anticoagulation: Xarelto 20 mg 1 tablet daily with supper -Medications previously tried: n/a -Home BP and HR readings: HR 70-80s  -Counseled on bleeding risk associated with Xarelto and importance of self-monitoring for signs/symptoms of bleeding; avoidance of NSAIDs due to increased bleeding risk with anticoagulants; -Recommended to continue current medication  GERD (Goal: minimize symptoms) -Controlled -Current treatment  Omeprazole 20 mg 1 capsule daily -Medications previously tried: none  -Counseled on non-pharmacologic management of symptoms such as elevating the head of your bed, avoiding eating 2-3 hours before bed, avoiding triggering foods such as acidic, spicy, or fatty foods, eating smaller meals, and wearing clothes that are loose around the waist  ED (Goal: minimize symptoms) -Controlled -Current treatment  tadalafil 5 mg 1 tablet daily as needed  -Medications previously tried: none  -Recommended to continue current  medication  Health Maintenance -Vaccine gaps: none -Current therapy:  Ipratropium 0.03% nasal spray 1 spray in both  nostrils daily Multivitamin 1 tablet daily -Educated on Cost vs benefit of each product must be carefully weighed by individual consumer -Patient is satisfied with current therapy and denies issues -Recommended to continue current medication  Patient Goals/Self-Care Activities Patient will:  - check blood pressure weekly, document, and provide at future appointments target a minimum of 150 minutes of moderate intensity exercise weekly engage in dietary modifications by increasing fiber intake  Follow Up Plan: Telephone follow up appointment with care management team member scheduled for: 6 months       Patient verbalizes understanding of instructions provided today and agrees to view in [place].  Telephone follow up appointment with pharmacy team member scheduled for: 6 months  Viona Gilmore, [facility], [facility]

## 2021-06-10 ENCOUNTER — Other Ambulatory Visit: Payer: Self-pay

## 2021-06-10 ENCOUNTER — Other Ambulatory Visit (INDEPENDENT_AMBULATORY_CARE_PROVIDER_SITE_OTHER): Payer: Medicare Other

## 2021-06-10 DIAGNOSIS — E7800 Pure hypercholesterolemia, unspecified: Secondary | ICD-10-CM

## 2021-06-10 DIAGNOSIS — R17 Unspecified jaundice: Secondary | ICD-10-CM | POA: Diagnosis not present

## 2021-06-10 LAB — HEPATIC FUNCTION PANEL
ALT: 20 U/L (ref 0–53)
AST: 22 U/L (ref 0–37)
Albumin: 4.2 g/dL (ref 3.5–5.2)
Alkaline Phosphatase: 50 U/L (ref 39–117)
Bilirubin, Direct: 0.2 mg/dL (ref 0.0–0.3)
Total Bilirubin: 1.1 mg/dL (ref 0.2–1.2)
Total Protein: 6.5 g/dL (ref 6.0–8.3)

## 2021-06-10 LAB — LIPID PANEL
Cholesterol: 166 mg/dL (ref 0–200)
HDL: 42 mg/dL (ref >39.00)
LDL Cholesterol: 101 mg/dL — ABNORMAL HIGH (ref 0–99)
NonHDL: 124.19
Total CHOL/HDL Ratio: 4
Triglycerides: 116 mg/dL (ref 0.0–149.0)
VLDL: 23.2 mg/dL (ref 0.0–40.0)

## 2021-06-11 LAB — CUP PACEART REMOTE DEVICE CHECK
Date Time Interrogation Session: 20221106005553
Implantable Pulse Generator Implant Date: 20190909

## 2021-06-13 ENCOUNTER — Telehealth: Payer: Self-pay | Admitting: Family Medicine

## 2021-06-13 NOTE — Telephone Encounter (Signed)
Patient called to have Dr.Koberlein give him a call back to discuss recent blood study results.    Good callback number is [phone]    Please advise

## 2021-06-13 NOTE — Telephone Encounter (Signed)
Spoke with the patient and he questioned the cause of elevated triglycerides, HDL has decreased and he questioned if he should restart Niacin, VLDL increased and he questioned the reason for this?  Also noticed the LDL decreased and questioned if this was due to increasing the Atorvastatin dosage and if the HDL decreasing would change the ratio from 3-4? Message sent to PCP.

## 2021-06-17 ENCOUNTER — Ambulatory Visit (INDEPENDENT_AMBULATORY_CARE_PROVIDER_SITE_OTHER): Payer: Medicare Other

## 2021-06-17 DIAGNOSIS — I480 Paroxysmal atrial fibrillation: Secondary | ICD-10-CM | POA: Diagnosis not present

## 2021-06-17 NOTE — Telephone Encounter (Signed)
Sending to you to help weigh in and discuss recommendations. What I would consider:  *triglycerides can fluctuate a lot pending what you had to eat in couple days prior to labs; they still are in great range; not worried about fluctuation.  *hdl has decreased, I do not feel he needs to add back in niacin. I think that diet rich in omega 3's is good, regular daily exercise. Would consider tighter LDL control given documented atherosclerosis, either lipitor adjustment or change to crestor.

## 2021-06-19 ENCOUNTER — Other Ambulatory Visit: Payer: Self-pay | Admitting: Family Medicine

## 2021-06-19 NOTE — Telephone Encounter (Signed)
Called patient and left a voicemail requesting a call back.

## 2021-06-24 NOTE — Progress Notes (Signed)
[facility] Summary Report / Loop Recorder 

## 2021-06-24 NOTE — Telephone Encounter (Signed)
Patient called back. Patient is agreeable to take atorvastatin 40 mg daily and does not wish to try a higher dose at this time. He is concerned about side effects with a higher dose and wishes to try this for now and will reconsider after next cholesterol check.

## 2021-06-25 ENCOUNTER — Other Ambulatory Visit: Payer: Self-pay | Admitting: Internal Medicine

## 2021-06-25 NOTE — Telephone Encounter (Signed)
Pt last saw Dr Lovena Le 07/09/20, last labs 04/19/21 Creat 0.91, age 75, weight 81.1kg, CrCl 80.46, based on CrCl pt is on appropriate dosage of Xarelto 20mg  QD for afib.  Will refill rx.

## 2021-06-26 ENCOUNTER — Encounter: Payer: Self-pay | Admitting: Internal Medicine

## 2021-07-05 DIAGNOSIS — H2512 Age-related nuclear cataract, left eye: Secondary | ICD-10-CM | POA: Diagnosis not present

## 2021-07-22 ENCOUNTER — Ambulatory Visit (INDEPENDENT_AMBULATORY_CARE_PROVIDER_SITE_OTHER): Payer: Medicare Other

## 2021-07-22 DIAGNOSIS — I480 Paroxysmal atrial fibrillation: Secondary | ICD-10-CM

## 2021-07-22 LAB — CUP PACEART REMOTE DEVICE CHECK
Date Time Interrogation Session: 20221217231241
Implantable Pulse Generator Implant Date: 20190909

## 2021-07-31 NOTE — Progress Notes (Signed)
[facility] Summary Report / Loop Recorder 

## 2021-08-06 ENCOUNTER — Telehealth: Payer: Self-pay | Admitting: *Deleted

## 2021-08-06 NOTE — Telephone Encounter (Signed)
Will route to [facility] EP Scheduler for an appointment for surgical clearance.

## 2021-08-06 NOTE — Telephone Encounter (Signed)
Clinical pharmacist to review Xarelto 

## 2021-08-06 NOTE — Telephone Encounter (Signed)
Patient with diagnosis of afib on Xarelto for anticoagulation.    Procedure: right ring finger trigger release Date of procedure: 08/30/21  CHA2DS2-VASc Score = 4  This indicates a 4.8% annual risk of stroke. The patient's score is based upon: CHF History: 0 HTN History: 1 Diabetes History: 0 Stroke History: 0 Vascular Disease History: 1 Age Score: 2 Gender Score: 0  CrCl 59mL/min Platelet count 214K  Per office protocol, patient can hold Xarelto for 1-2 days prior to procedure.

## 2021-08-06 NOTE — Telephone Encounter (Signed)
° °  Name: Garrett Vargas  DOB: 05-09-1946  MRN: [id number]  Primary Cardiologist: Dr. Lovena Le  Chart reviewed as part of pre-operative protocol coverage. Because of [name]'s past medical history and time since last visit, he will require a follow-up visit in order to better assess preoperative cardiovascular risk.  Pre-op covering staff: - Please schedule appointment and call patient to inform them. If patient already had an upcoming appointment within acceptable timeframe, please add "pre-op clearance" to the appointment notes so provider is aware. - Please contact requesting surgeon's office via preferred method (i.e, phone, fax) to inform them of need for appointment prior to surgery.  If applicable, this message will also be routed to pharmacy pool and/or primary cardiologist for input on holding anticoagulant/antiplatelet agent as requested below so that this information is available to the clearing provider at time of patient's appointment.    Although procedure itself is very low risk, however patient has not been seen by Dr. Lovena Le in over 1 year (last visit 07/09/2020). If medical clearance is needed, then he will need another follow up.   [place], [place]  08/06/2021, 1:46 PM

## 2021-08-06 NOTE — Telephone Encounter (Signed)
° °  Pre-operative Risk Assessment    Patient Name: Garrett Vargas  DOB: 1945/10/29 MRN: [id number]      Request for Surgical Clearance    Procedure:   RIGHT RING FINGER TRIGGER RELEASE  Date of Surgery:  Clearance 08/30/21                                 Surgeon:  DR. Daryll Brod Surgeon's Group or Practice Name:   [place] Phone number:   [phone] Fax number:   [phone]   Type of Clearance Requested:   - Pharmacy:  Hold Rivaroxaban (Xarelto) NOT INDICATED   Type of Anesthesia:   IV REGIONAL FOREARM BLOCK   Additional requests/questions:      Astrid Divine   08/06/2021, 1:01 PM

## 2021-08-07 ENCOUNTER — Other Ambulatory Visit: Payer: Self-pay | Admitting: Orthopedic Surgery

## 2021-08-07 NOTE — Telephone Encounter (Signed)
LVM for patient to call and schedule appt with Dr. Lovena Le or EP APP for surgical clearance

## 2021-08-09 NOTE — Telephone Encounter (Signed)
Pt has appt 08/13/21 with Tommye Standard, PAC. Will forward notes to [facility] for upcoming appt. Will send FYI to requesting office the pt has appt 08/13/21.

## 2021-08-11 NOTE — Progress Notes (Signed)
Cardiology Office Note Date:  08/13/2021  Patient ID:  Garrett, Vargas 1946/03/07, MRN [id number] PCP:  Caren Macadam, MD  Electrophysiologist: Dr. Lovena Le    Chief Complaint: pre-op clearance  History of Present Illness: Garrett Vargas is a 76 y.o. 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  2015: LVEF 30's has wobbled since then 2016 60-65%, 2018 45-50%  He comes today to be seen for Dr. Lovena Le, last seen by him in Dec 2021.  Discussed hx of  PAF, tachy induced CM that resolved, remote tobacco abuse and syncope, s/p ILR insertion. Several months ago his atrial fib/flutter was increasing in frequency and he was started on low dose flecainide. He has done well in the interim. He has not had palpitations or syncope.  Was maintaining SR, stable intervals, no changes were made.   Planned for trigger finger surgery/release RPH has addressed Xarelto, hold for 1-2 days RCRI score is one, 0.9% risk DUKE score is 8.97METS  TODAY He is doing quite well Exercising with some stretching/exercising classes, palys gold. No exertional incapacities. No CP, palpitations or cardiac awareness, no SOB No syncope since his loop implant No bleeding or signs of bleeding  Device information MDT LINQ implanted 04/12/2018   Past Medical History:  Diagnosis Date   A-fib ([place])    ALLERGIC RHINITIS    Arthritis    Colon polyp    Dysrhythmia    Erectile dysfunction    History of bronchitis as a child    Hyperlipemia    Hypertension    S/P lateral meniscectomy of left knee     Past Surgical History:  Procedure Laterality Date   CARDIOVERSION N/A 03/17/2014   Procedure: CARDIOVERSION;  Surgeon: Sanda Klein, MD;  Location: [place];  Service: Cardiovascular;  Laterality: N/A;   COLONOSCOPY WITH PROPOFOL N/A 11/19/2020   Procedure: COLONOSCOPY WITH PROPOFOL;  Surgeon: Irving Copas., MD;  Location: [facility];  Service:  Gastroenterology;  Laterality: N/A;   colonscopy      ELBOW ARTHROSCOPY Bilateral    ENDOSCOPIC MUCOSAL RESECTION N/A 11/19/2020   Procedure: ENDOSCOPIC MUCOSAL RESECTION;  Surgeon: [name] Telford Nab., MD;  Location: [facility];  Service: Gastroenterology;  Laterality: N/A;   HEMOSTASIS CLIP PLACEMENT  11/19/2020   Procedure: HEMOSTASIS CLIP PLACEMENT;  Surgeon: Irving Copas., MD;  Location: [facility];  Service: Gastroenterology;;   LOOP RECORDER INSERTION N/A 04/12/2018   Procedure: LOOP RECORDER INSERTION;  Surgeon: Evans Lance, MD;  Location: [place] [facility];  Service: Cardiovascular;  Laterality: N/A;   Multiple Knee,elbow and foot surgeries     neuroma left foot,2010, Dr. Anne Fu     POLYPECTOMY  11/19/2020   Procedure: POLYPECTOMY;  Surgeon: [name] Telford Nab., MD;  Location: [facility];  Service: Gastroenterology;;   Maryagnes Amos INJECTION  11/19/2020   Procedure: SUBMUCOSAL LIFTING INJECTION;  Surgeon: Irving Copas., MD;  Location: [facility];  Service: Gastroenterology;;   TEE WITHOUT CARDIOVERSION N/A 03/17/2014   Procedure: TRANSESOPHAGEAL ECHOCARDIOGRAM (TEE);  Surgeon: Sanda Klein, MD;  Location: [place];  Service: Cardiovascular;  Laterality: N/A;   TOTAL HIP ARTHROPLASTY Left 07/18/2015   Procedure: LEFT TOTAL HIP ARTHROPLASTY ANTERIOR APPROACH;  Surgeon: Gaynelle Arabian, MD;  Location: [facility];  Service: Orthopedics;  Laterality: Left;    Current Outpatient Medications  Medication Sig Dispense Refill   atenolol (TENORMIN) 25 MG tablet TAKE ONE-HALF TABLET BY  MOUTH DAILY. MAY TAKE AN  EXTRA ONE-HALF TABLET  DAILY FOR PALPITATIONS OR HEART  RACING. (Patient taking differently: Take 25 mg by mouth daily.) 90 tablet 3   atorvastatin (LIPITOR) 40 MG tablet Take 1 tablet (40 mg total) by mouth daily. 90 tablet 2   flecainide (TAMBOCOR) 50 MG tablet TAKE 1 TABLET BY MOUTH  TWICE DAILY (Patient taking differently: Take 50 mg  by mouth 2 (two) times daily.) 180 tablet 3   ipratropium (ATROVENT) 0.03 % nasal spray Place 1 spray into both nostrils as needed.     Multiple Vitamins-Minerals (CENTRUM SILVER PO) Take 1 capsule by mouth daily.     Omega-3 Fatty Acids (FISH OIL PO) Take 1,040 mg by mouth daily.     omeprazole (PRILOSEC) 20 MG capsule Take 20 mg by mouth daily.     rivaroxaban (XARELTO) 20 MG TABS tablet TAKE 1 TABLET BY MOUTH  DAILY WITH SUPPER 90 tablet 1   tadalafil (CIALIS) 5 MG tablet Take 5 mg by mouth daily as needed for erectile dysfunction.     niacin 500 MG tablet Take 500 mg by mouth at bedtime. SR (Patient not taking: Reported on 08/13/2021)     No current facility-administered medications for this visit.    Allergies:   Patient has no known allergies.   Social History:  The patient  reports that he quit smoking about 34 years ago. His smoking use included cigarettes. He has a 5.00 pack-year smoking history. He has never used smokeless tobacco. He reports current alcohol use. He reports that he does not use drugs.   Family History:  The patient's family history includes Coronary artery disease in an other family member; Heart disease in his mother; Kidney disease in his sister; Prostate cancer in an other family member; Prostate cancer (age of onset: 77) in his father; Stroke in an other family member; Stroke (age of onset: 10) in his mother.  ROS:  Please see the history of present illness.    All other systems are reviewed and otherwise negative.   PHYSICAL EXAM:  VS:  BP 120/80    Pulse 68    Ht 6\' 2"  (1.88 m)    Wt 185 lb 6.4 oz (84.1 kg)    SpO2 97%    BMI 23.80 kg/m  BMI: Body mass index is 23.8 kg/m. Well nourished, well developed, in no acute distress HEENT: normocephalic, atraumatic Neck: no JVD, carotid bruits or masses Cardiac:  RRR; a couple extrasystoles noted, no significant murmurs, no rubs, or gallops Lungs:  CTA b/l, no wheezing, rhonchi or rales Abd: soft, nontender MS:  no deformity or atrophy Ext: no edema Skin: warm and dry, no rash Neuro:  No gross deficits appreciated Psych: euthymic mood, full affect  ILR site is stable, no tethering or discomfort   EKG:  Done today and reviewed by myself shows  SR 68bpm, RBBB, stable intervals from last  Device interrogation done today and reviewed by myself:  Battery is good R waves 0.12mV No true AFib episodes  03/30/2018: TTE Study Conclusions  - Left ventricle: The cavity size was at the upper limits of    normal. Wall thickness was normal. Systolic function was mildly    reduced. The estimated ejection fraction was in the range of 45%    to 50%. Mild diffuse hypokinesis with no identifiable regional    variations.  - Left atrium: The atrium was mildly dilated.  - Right ventricle: The cavity size was mildly dilated. Wall    thickness was normal. Systolic function was  mildly reduced.  - Right atrium: The atrium was mildly dilated.  - Atrial septum: No defect or patent foramen ovale was identified.    Recent Labs: 04/19/2021: BUN 16; Creatinine, Ser 0.91; Hemoglobin 15.6; Platelets 214.0; Potassium 4.4; Sodium 140; TSH 2.08 06/10/2021: ALT 20  06/10/2021: Cholesterol 166; HDL 42.00; LDL Cholesterol 101; Total CHOL/HDL Ratio 4; Triglycerides 116.0; VLDL 23.2   CrCl cannot be calculated (Patient's most recent lab result is older than the maximum 21 days allowed.).   Wt Readings from Last 3 Encounters:  08/13/21 185 lb 6.4 oz (84.1 kg)  04/19/21 178 lb 14.4 oz (81.1 kg)  11/14/20 182 lb (82.6 kg)     Other studies reviewed: Additional studies/records reviewed today include: summarized above  ASSESSMENT AND PLAN:  Paroxysmal Afib CHA2DS2Vasc is 4, on Xarelto, appropriately dosed Stable intervals on flecainde/atenolol 0 & burden  CM Last LVEF 45-50% No symptoms or exam findings to suggest volume OL or clinical change  HTN Looks good  4.   Pre-op Low risk surgery Low cardiac risk for the  surgery planned    Disposition: F/u with remotes as usual and in clinic with EP in 64mo, sooner if needed  Current medicines are reviewed at length with the patient today.  The patient did not have any concerns regarding medicines.  Venetia Night, PA-C 08/13/2021 2:54 PM     [place] [address] [phone] (office)  [phone] (fax)

## 2021-08-13 ENCOUNTER — Ambulatory Visit: Payer: Medicare Other | Admitting: Physician Assistant

## 2021-08-13 ENCOUNTER — Encounter: Payer: Self-pay | Admitting: Physician Assistant

## 2021-08-13 ENCOUNTER — Other Ambulatory Visit: Payer: Self-pay

## 2021-08-13 VITALS — BP 120/80 | HR 68 | Ht 74.0 in | Wt 185.4 lb

## 2021-08-13 DIAGNOSIS — I480 Paroxysmal atrial fibrillation: Secondary | ICD-10-CM

## 2021-08-13 DIAGNOSIS — R55 Syncope and collapse: Secondary | ICD-10-CM

## 2021-08-13 DIAGNOSIS — Z5181 Encounter for therapeutic drug level monitoring: Secondary | ICD-10-CM

## 2021-08-13 DIAGNOSIS — Z79899 Other long term (current) drug therapy: Secondary | ICD-10-CM | POA: Diagnosis not present

## 2021-08-13 LAB — CUP PACEART INCLINIC DEVICE CHECK
Date Time Interrogation Session: 20230110171612
Implantable Pulse Generator Implant Date: 20190909

## 2021-08-13 NOTE — Patient Instructions (Signed)
Medication Instructions:   Your physician recommends that you continue on your current medications as directed. Please refer to the Current Medication list given to you today.'  *If you need a refill on your cardiac medications before your next appointment, please call your pharmacy*   Lab Work: NONE ORDERED  TODAY   If you have labs (blood work) drawn today and your tests are completely normal, you will receive your results only by: . MyChart Message (if you have MyChart) OR . A paper copy in the mail If you have any lab test that is abnormal or we need to change your treatment, we will call you to review the results.   Testing/Procedures: NONE ORDERED  TODAY    Follow-Up: At [facility], you and your health needs are our priority.  As part of our continuing mission to provide you with exceptional heart care, we have created designated Provider Care Teams.  These Care Teams include your primary Cardiologist (physician) and Advanced Practice Providers (APPs -  Physician Assistants and Nurse Practitioners) who all work together to provide you with the care you need, when you need it.  We recommend signing up for the patient portal called "MyChart".  Sign up information is provided on this After Visit Summary.  MyChart is used to connect with patients for Virtual Visits (Telemedicine).  Patients are able to view lab/test results, encounter notes, upcoming appointments, etc.  Non-urgent messages can be sent to your provider as well.   To learn more about what you can do with MyChart, go to [email or web address].    Your next appointment:   6 month(s)  The format for your next appointment:   In Person  Provider:   Gregg Taylor, MD   Other Instructions  

## 2021-08-15 ENCOUNTER — Other Ambulatory Visit: Payer: Self-pay | Admitting: Internal Medicine

## 2021-08-20 ENCOUNTER — Telehealth: Payer: Self-pay | Admitting: Pharmacist

## 2021-08-20 NOTE — Chronic Care Management (AMB) (Signed)
Chronic Care Management Pharmacy Assistant   Name: Garrett Vargas  MRN: [id number] DOB: 07-May-1946  Reason for Encounter: Disease State / Hyperlipidemia Assessment Call   Conditions to be addressed/monitored: HLD  Recent office visits:  None  Recent consult visits:  08/13/2021 Tommye Standard PA-C(cardiology) - Patient was seen for paroxysmal atrial fibrillation and additional issues. No medication changes. Follow up in 6 months.   Hospital visits:  None  Medications: Outpatient Encounter Medications as of 08/20/2021  Medication Sig   atenolol (TENORMIN) 25 MG tablet TAKE ONE-HALF TABLET BY  MOUTH DAILY - MAY TAKE AN  EXTRA ONE-HALF TAB DAILY  FOR PALPITATIONS OR HEART  RACING   atorvastatin (LIPITOR) 40 MG tablet Take 1 tablet (40 mg total) by mouth daily.   flecainide (TAMBOCOR) 50 MG tablet TAKE 1 TABLET BY MOUTH  TWICE DAILY   ipratropium (ATROVENT) 0.03 % nasal spray Place 1 spray into both nostrils as needed.   Multiple Vitamins-Minerals (CENTRUM SILVER PO) Take 1 capsule by mouth daily.   niacin 500 MG tablet Take 500 mg by mouth at bedtime. SR (Patient not taking: Reported on 08/13/2021)   Omega-3 Fatty Acids (FISH OIL PO) Take 1,040 mg by mouth daily.   omeprazole (PRILOSEC) 20 MG capsule Take 20 mg by mouth daily.   rivaroxaban (XARELTO) 20 MG TABS tablet TAKE 1 TABLET BY MOUTH  DAILY WITH SUPPER   tadalafil (CIALIS) 5 MG tablet Take 5 mg by mouth daily as needed for erectile dysfunction.   No facility-administered encounter medications on file as of 08/20/2021.  Fill History: ATENOLOL  25 MG TABS 02/28/2021 90   ATORVASTATIN CALCIUM  40 MG TABS 08/05/2021 90   FLECAINIDE ACETATE  50 MG TABS 02/28/2021 90   IPRATROPIUM BROMIDE  0.03 % SOLN 04/07/2021 30   XARELTO  20 MG TABS 08/05/2021 90   08/20/2021 Name: Garrett Vargas MRN: [id number] DOB: 1945/11/04 Garrett Vargas is a 76 y.o. year old male who is a primary care patient of Caren Macadam, MD.   Comprehensive medication review performed; Spoke to patient regarding cholesterol  Lipid Panel    Component Value Date/Time   CHOL 166 06/10/2021 0934   TRIG 116.0 06/10/2021 0934   HDL 42.00 06/10/2021 0934   LDLCALC 101 (H) 06/10/2021 0934   LDLCALC 117 (H) 03/14/2020 0840    10-year ASCVD risk score: The 10-year ASCVD risk score (Arnett DK, et al., 2019) is: 25.6%   Values used to calculate the score:     Age: 76 years     Sex: Male     Is Non-Hispanic African American: No     Diabetic: No     Tobacco smoker: No     Systolic Blood Pressure: 329 mmHg     Is BP treated: Yes     HDL Cholesterol: 42 mg/dL     Total Cholesterol: 166 mg/dL   Current antihyperlipidemic regimen:  Atorvastatin 40 mg daily  Previous antihyperlipidemic medications tried: No other antihyperlipidemic medications tried.  ASCVD risk enhancing conditions: age >45 and HTN  What recent interventions/DTPs have been made by any provider to improve Cholesterol control since last CPP Visit: No recent interventions.   Any recent hospitalizations or ED visits since last visit with CPP? No recent hospital visits.  What diet changes have been made to improve Cholesterol?    What exercise is being done to improve Cholesterol?     Unable to reach patient after several attempts.   Adherence Review:  Does the patient have >5 day gap between last estimated fill dates? No  Care Gaps: AWV - completed on 11/12/20 Last BP - 120/80 on 08/13/2021  Star Rating Drugs: Atorvastatin 40mg  - last filled 08/05/2021 90 DS at [facility] Pharmacist Assistant [phone]

## 2021-08-21 ENCOUNTER — Encounter (HOSPITAL_BASED_OUTPATIENT_CLINIC_OR_DEPARTMENT_OTHER): Payer: Self-pay | Admitting: Orthopedic Surgery

## 2021-08-21 ENCOUNTER — Other Ambulatory Visit: Payer: Self-pay

## 2021-08-23 LAB — CUP PACEART REMOTE DEVICE CHECK
Date Time Interrogation Session: 20230119231524
Implantable Pulse Generator Implant Date: 20190909

## 2021-08-26 ENCOUNTER — Ambulatory Visit (INDEPENDENT_AMBULATORY_CARE_PROVIDER_SITE_OTHER): Payer: Medicare Other

## 2021-08-26 DIAGNOSIS — R55 Syncope and collapse: Secondary | ICD-10-CM | POA: Diagnosis not present

## 2021-08-30 ENCOUNTER — Ambulatory Visit (HOSPITAL_BASED_OUTPATIENT_CLINIC_OR_DEPARTMENT_OTHER): Payer: Medicare Other | Admitting: Certified Registered"

## 2021-08-30 ENCOUNTER — Encounter (HOSPITAL_BASED_OUTPATIENT_CLINIC_OR_DEPARTMENT_OTHER)
Admission: RE | Disposition: A | Discharge status: Home / Self Care | Payer: Self-pay | Source: Home / Self Care | Attending: Orthopedic Surgery

## 2021-08-30 ENCOUNTER — Ambulatory Visit (HOSPITAL_BASED_OUTPATIENT_CLINIC_OR_DEPARTMENT_OTHER)
Admission: RE | Admit: 2021-08-30 | Discharge: 2021-08-30 | Disposition: A | Discharge status: Home / Self Care | Payer: Medicare Other | Attending: Orthopedic Surgery | Admitting: Orthopedic Surgery

## 2021-08-30 ENCOUNTER — Other Ambulatory Visit: Payer: Self-pay

## 2021-08-30 ENCOUNTER — Encounter (HOSPITAL_BASED_OUTPATIENT_CLINIC_OR_DEPARTMENT_OTHER): Payer: Self-pay | Admitting: Orthopedic Surgery

## 2021-08-30 DIAGNOSIS — M65341 Trigger finger, right ring finger: Secondary | ICD-10-CM | POA: Insufficient documentation

## 2021-08-30 DIAGNOSIS — I4891 Unspecified atrial fibrillation: Secondary | ICD-10-CM | POA: Diagnosis not present

## 2021-08-30 DIAGNOSIS — Z87891 Personal history of nicotine dependence: Secondary | ICD-10-CM | POA: Diagnosis not present

## 2021-08-30 DIAGNOSIS — M720 Palmar fascial fibromatosis [Dupuytren]: Secondary | ICD-10-CM | POA: Insufficient documentation

## 2021-08-30 DIAGNOSIS — M65841 Other synovitis and tenosynovitis, right hand: Secondary | ICD-10-CM | POA: Diagnosis not present

## 2021-08-30 DIAGNOSIS — I10 Essential (primary) hypertension: Secondary | ICD-10-CM | POA: Insufficient documentation

## 2021-08-30 DIAGNOSIS — M1990 Unspecified osteoarthritis, unspecified site: Secondary | ICD-10-CM | POA: Diagnosis not present

## 2021-08-30 HISTORY — PX: TRIGGER FINGER RELEASE: SHX641

## 2021-08-30 HISTORY — DX: Gastro-esophageal reflux disease without esophagitis: K21.9

## 2021-08-30 SURGERY — RELEASE, A1 PULLEY, FOR TRIGGER FINGER
Anesthesia: Monitor Anesthesia Care | Site: Hand | Laterality: Right

## 2021-08-30 MED ORDER — LIDOCAINE HCL (PF) 0.5 % IJ SOLN
INTRAMUSCULAR | Status: DC | PRN
Start: 2021-08-30 — End: 2021-08-30
  Administered 2021-08-30: 50 mL via INTRAVENOUS

## 2021-08-30 MED ORDER — LACTATED RINGERS IV SOLN: INTRAVENOUS | Status: DC

## 2021-08-30 MED ORDER — FENTANYL CITRATE (PF) 100 MCG/2ML IJ SOLN
INTRAMUSCULAR | Status: AC
  Filled 2021-08-30: qty 2

## 2021-08-30 MED ORDER — TRAMADOL HCL 50 MG PO TABS
50.0000 mg | ORAL_TABLET | Freq: Four times a day (QID) | ORAL | 0 refills | Status: DC | PRN
Start: 2021-08-30 — End: 2022-02-13

## 2021-08-30 MED ORDER — FENTANYL CITRATE (PF) 100 MCG/2ML IJ SOLN: 25.0000 ug | INTRAMUSCULAR | Status: DC | PRN

## 2021-08-30 MED ORDER — PROPOFOL 500 MG/50ML IV EMUL
INTRAVENOUS | Status: DC | PRN
  Administered 2021-08-30: 200 ug/kg/min via INTRAVENOUS

## 2021-08-30 MED ORDER — FENTANYL CITRATE (PF) 100 MCG/2ML IJ SOLN
INTRAMUSCULAR | Status: DC | PRN
Start: 2021-08-30 — End: 2021-08-30
  Administered 2021-08-30: 50 ug via INTRAVENOUS
  Administered 2021-08-30 (×2): 25 ug via INTRAVENOUS

## 2021-08-30 MED ORDER — BUPIVACAINE HCL (PF) 0.25 % IJ SOLN
INTRAMUSCULAR | Status: DC | PRN
  Administered 2021-08-30: 9 mL

## 2021-08-30 MED ORDER — CEFAZOLIN SODIUM-DEXTROSE 2-4 GM/100ML-% IV SOLN
2.0000 g | INTRAVENOUS | Status: AC
  Administered 2021-08-30: 2 g via INTRAVENOUS

## 2021-08-30 MED ORDER — CEFAZOLIN SODIUM-DEXTROSE 2-4 GM/100ML-% IV SOLN
INTRAVENOUS | Status: AC
  Filled 2021-08-30: qty 100

## 2021-08-30 MED ORDER — 0.9 % SODIUM CHLORIDE (POUR BTL) OPTIME
TOPICAL | Status: DC | PRN
  Administered 2021-08-30: 200 mL

## 2021-08-30 MED ORDER — BUPIVACAINE HCL (PF) 0.25 % IJ SOLN
INTRAMUSCULAR | Status: AC
  Filled 2021-08-30: qty 30

## 2021-08-30 MED ORDER — ONDANSETRON HCL 4 MG/2ML IJ SOLN
INTRAMUSCULAR | Status: DC | PRN
  Administered 2021-08-30: 4 mg via INTRAVENOUS

## 2021-08-30 SURGICAL SUPPLY — 32 items
APL PRP STRL LF DISP 70% ISPRP (MISCELLANEOUS) ×1
BLADE SURG 15 STRL LF DISP TIS (BLADE) ×2 IMPLANT
BLADE SURG 15 STRL SS (BLADE) ×2
BNDG CMPR 5X2 CHSV 1 LYR STRL (GAUZE/BANDAGES/DRESSINGS) ×1
BNDG CMPR 9X4 STRL LF SNTH (GAUZE/BANDAGES/DRESSINGS)
BNDG COHESIVE 2X5 TAN ST LF (GAUZE/BANDAGES/DRESSINGS) ×3 IMPLANT
BNDG ESMARK 4X9 LF (GAUZE/BANDAGES/DRESSINGS) IMPLANT
CHLORAPREP W/TINT 26 (MISCELLANEOUS) ×3 IMPLANT
CORD BIPOLAR FORCEPS 12FT (ELECTRODE) ×1 IMPLANT
COVER BACK TABLE 60X90IN (DRAPES) ×3 IMPLANT
COVER MAYO STAND STRL (DRAPES) ×3 IMPLANT
CUFF TOURN SGL QUICK 18X4 (TOURNIQUET CUFF) ×1 IMPLANT
DECANTER SPIKE VIAL GLASS SM (MISCELLANEOUS) IMPLANT
DRAPE EXTREMITY T 121X128X90 (DISPOSABLE) ×3 IMPLANT
DRAPE SURG 17X23 STRL (DRAPES) ×3 IMPLANT
GAUZE SPONGE 4X4 12PLY STRL (GAUZE/BANDAGES/DRESSINGS) ×3 IMPLANT
GAUZE XEROFORM 1X8 LF (GAUZE/BANDAGES/DRESSINGS) ×3 IMPLANT
GLOVE SURG ORTHO LTX SZ8 (GLOVE) ×3 IMPLANT
GLOVE SURG UNDER POLY LF SZ8.5 (GLOVE) ×3 IMPLANT
GOWN STRL REUS W/ TWL LRG LVL3 (GOWN DISPOSABLE) ×2 IMPLANT
GOWN STRL REUS W/TWL LRG LVL3 (GOWN DISPOSABLE) ×2
GOWN STRL REUS W/TWL XL LVL3 (GOWN DISPOSABLE) ×3 IMPLANT
NDL HYPO 27GX1-1/4 (NEEDLE) ×2 IMPLANT
NEEDLE HYPO 27GX1-1/4 (NEEDLE) ×2 IMPLANT
NS IRRIG 1000ML POUR BTL (IV SOLUTION) ×3 IMPLANT
PACK BASIN DAY SURGERY FS (CUSTOM PROCEDURE TRAY) ×3 IMPLANT
STOCKINETTE 4X48 STRL (DRAPES) ×3 IMPLANT
SUT ETHILON 4 0 PS 2 18 (SUTURE) ×3 IMPLANT
SYR BULB EAR ULCER 3OZ GRN STR (SYRINGE) ×3 IMPLANT
SYR CONTROL 10ML LL (SYRINGE) ×3 IMPLANT
TOWEL GREEN STERILE FF (TOWEL DISPOSABLE) ×6 IMPLANT
UNDERPAD 30X36 HEAVY ABSORB (UNDERPADS AND DIAPERS) ×3 IMPLANT

## 2021-08-30 NOTE — Transfer of Care (Signed)
Immediate Anesthesia Transfer of Care Note  Patient: Garrett Vargas  Procedure(s) Performed: RELEASE TRIGGER FINGER/A-1 PULLEY RIGHT RING FINGER (Right: Hand)  Patient Location: PACU  Anesthesia Type:MAC and Bier block  Level of Consciousness: drowsy  Airway & Oxygen Therapy: Patient Spontanous Breathing and Patient connected to face mask oxygen  Post-op Assessment: Report given to RN and Post -op Vital signs reviewed and stable  Post vital signs: Reviewed and stable  Last Vitals:  Vitals Value Taken Time  BP 139/62 08/30/21 1350  Temp    Pulse    Resp 18 08/30/21 1351  SpO2    Vitals shown include unvalidated device data.  Last Pain:  Vitals:   08/30/21 1210  TempSrc: Oral  PainSc: 0-No pain         Complications: No notable events documented.

## 2021-08-30 NOTE — H&P (Signed)
Garrett Vargas is an 76 y.o. male.   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 Has no history of injury. He has not tried taking anything for this. He states it is worse in the morning. He occasionally has to use his opposite hand to help straighten it in the morning. He has no history of diabetes thyroid problems or gout. He does have history of arthritis. Family history is negative free to these. He has had 2 injections to the A1 pulley.  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 Past Medical History:  Diagnosis Date   A-fib ([place])    ALLERGIC RHINITIS    Arthritis    Colon polyp    Dysrhythmia    Erectile dysfunction    GERD (gastroesophageal reflux disease)    History of bronchitis as a child    Hyperlipemia    Hypertension    S/P lateral meniscectomy of left knee     Past Surgical History:  Procedure Laterality Date   CARDIOVERSION N/A 03/17/2014   Procedure: CARDIOVERSION;  Surgeon: Sanda Klein, MD;  Location: [facility];  Service: Cardiovascular;  Laterality: N/A;   COLONOSCOPY WITH PROPOFOL N/A 11/19/2020   Procedure: COLONOSCOPY WITH PROPOFOL;  Surgeon: Irving Copas., MD;  Location: [facility];  Service: Gastroenterology;  Laterality: N/A;   colonscopy      ELBOW ARTHROSCOPY Bilateral    ENDOSCOPIC MUCOSAL RESECTION N/A 11/19/2020   Procedure: ENDOSCOPIC MUCOSAL RESECTION;  Surgeon: [name] Telford Nab., MD;  Location: [facility];  Service: Gastroenterology;  Laterality: N/A;   HEMOSTASIS CLIP PLACEMENT  11/19/2020   Procedure: HEMOSTASIS CLIP PLACEMENT;  Surgeon: Irving Copas., MD;  Location: [facility];  Service: Gastroenterology;;   LOOP RECORDER INSERTION N/A 04/12/2018   Procedure: LOOP RECORDER INSERTION;  Surgeon: Evans Lance, MD;  Location: [place] [facility];  Service: Cardiovascular;  Laterality:  N/A;   Multiple Knee,elbow and foot surgeries     neuroma left foot,2010, Dr. Anne Fu     POLYPECTOMY  11/19/2020   Procedure: POLYPECTOMY;  Surgeon: [name] Telford Nab., MD;  Location: [facility];  Service: Gastroenterology;;   Maryagnes Amos INJECTION  11/19/2020   Procedure: SUBMUCOSAL LIFTING INJECTION;  Surgeon: Irving Copas., MD;  Location: [facility];  Service: Gastroenterology;;   TEE WITHOUT CARDIOVERSION N/A 03/17/2014   Procedure: TRANSESOPHAGEAL ECHOCARDIOGRAM (TEE);  Surgeon: Sanda Klein, MD;  Location: [place];  Service: Cardiovascular;  Laterality: N/A;   TOTAL HIP ARTHROPLASTY Left 07/18/2015   Procedure: LEFT TOTAL HIP ARTHROPLASTY ANTERIOR APPROACH;  Surgeon: Gaynelle Arabian, MD;  Location: [facility];  Service: Orthopedics;  Laterality: Left;    Family History  Problem Relation Age of Onset   Coronary artery disease Other    Prostate cancer Other    Stroke Other    Heart disease Mother    Stroke Mother 56   Prostate cancer Father 57   Kidney disease Sister    Social History:  reports that he quit smoking about 34 years ago. His smoking use included cigarettes. He has a 5.00 pack-year smoking history. He has never used smokeless tobacco. He reports current alcohol use. He reports that he does not use drugs.  Allergies: No Known Allergies  No medications prior to admission.    No results found for this or any previous visit (from the past 48 hour(s)).  No  results found.   Pertinent items are noted in HPI.  Height 6\' 2"  (1.88 m), weight 83.9 kg.  General appearance: alert, cooperative, and appears stated age Head: Normocephalic, without obvious abnormality Neck: no JVD Resp: clear to auscultation bilaterally Cardio: regular rate and rhythm, S1, S2 normal, no murmur, click, rub or gallop GI: soft, non-tender; bowel sounds normal; no masses,  no organomegaly Extremities:  Trigger right ring finger with Dupuytren's cord Pulses:  2+ and symmetric Skin: Skin color, texture, turgor normal. No rashes or lesions Neurologic: Grossly normal Incision/Wound: na  Assessment/Plan 1. Trigger ring finger of right hand Dupuytren's cord traction Plan we have discussed with him excision of the Dupuytren's cord and release of the A1 pulley right ring finger.  Preperi-and postoperative course were discussed along with risk and complications.  He is aware there is no guarantee to the surgery the possibility of infection recurrence injury to arteries nerves tendons incomplete relief symptoms dystrophy.  Schedule as an outpatient under regional anesthesia. Daryll Brod 08/30/2021, 5:52 AM

## 2021-08-30 NOTE — Discharge Instructions (Addendum)

## 2021-08-30 NOTE — Anesthesia Procedure Notes (Signed)
Anesthesia Regional Block: Bier block (IV Regional)   Pre-Anesthetic Checklist: , timeout performed,  Correct Patient, Correct Site, Correct Laterality,  Correct Procedure, Correct Position, site marked,  Risks and benefits discussed,  Surgical consent,  Pre-op evaluation,  At surgeon's request and post-op pain management  Laterality: Right  Prep: alcohol swabs       Needles:  Injection technique: Single-shot      Additional Needles:   Procedures:,,,,, intact distal pulses, Esmarch exsanguination,  Single tourniquet utilized,  #20gu IV placed    Narrative:  Start time: 08/30/2021 1:13 PM End time: 08/30/2021 1:15 PM  Performed by: Personally

## 2021-08-30 NOTE — Op Note (Signed)
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   ASSISTANT: none   ANESTHESIA:  Bier block with sedation and Local   INTRAVENOUS FLUIDS:  Per anesthesia flow sheet.   ESTIMATED BLOOD LOSS:  Minimal.   COMPLICATIONS:  None.   SPECIMENS: Palmar cord   TOURNIQUET TIME:    Total Tourniquet Time Documented: Upper Arm (Right) - 29 minutes Total: Upper Arm (Right) - 29 minutes    DISPOSITION:  Stable to PACU.   INDICATIONS: Patient is a 76 year old male with a long history of trigger of his right ring finger.  He has not responded to conservative treatment.  He is admitted for release of the A1 pulley along with excision of the palmar cord ring finger.  Prepare postoperative course been discussed along with risks and complications.  He is aware that there is no guarantee to the surgery the possibility of infection recurrence injury to arteries nerves tendons incomplete relief symptoms and dystrophy.  Preoperative area the patient is seen extremity marked by both patient and surgeon antibiotic given  OPERATIVE COURSE: Patient is brought to the operating room placed in the supine position with right arm free.  An upper arm IV regional anesthetic was carried out without difficulty under direction anesthesia department.  Prep was done with ChloraPrep.  A 3-minute dry time was allowed timeout taken confirm patient procedure.  A volar Bruner incision was made over the proximal to distal cord.  This carried down through subcutaneous tissue.  The cord was immediately identified and was released  proximally.  Neurovascular structures were identified and protected.  The cord was then followed distally as it inserted around the A1 pulley.  The cord was isolated and resected and sent to pathology.  The A1 pulley was then noted to be thickened along with thickening of the transverse fibers more proximally.  These were released.  The A1 pulley was then released on its radial aspect a small incision made centrally and A2.  2 tendons were then separated using retractors breaking the adhesions proximally.  The finger was placed through a full passive range of motion no further triggering was noted.  The wound was copiously irrigated with saline.  Skin was closed interrupted 4 nylon sutures.  Local infiltration with quarter percent bupivacaine without epinephrine was given 9 cc was used.  Sterile compressive dressing with the fingers free was applied.  Deflation of the tourniquet all fingers immediately pink.  He was taken to the recovery room for observation in satisfactory condition.  He will be discharged home return to the hand center [facility] in 1 week and Tylenol for pain with Ultram for breakthrough.   Daryll Brod, MD Electronically signed, 08/30/21

## 2021-08-30 NOTE — Brief Op Note (Signed)
08/30/2021  1:46 PM  PATIENT:  Garrett Vargas  76 y.o. male  PRE-OPERATIVE DIAGNOSIS:  TRIGGER DIGIT RIGHT RING FINGER  POST-OPERATIVE DIAGNOSIS:  TRIGGER DIGIT RIGHT RING FINGER  PROCEDURE:  Procedure(s) with comments: RELEASE TRIGGER FINGER/A-1 PULLEY RIGHT RING FINGER (Right) - 45 MIN  SURGEON:  Surgeon(s) and Role:    * Daryll Brod, MD - Primary  PHYSICIAN ASSISTANT:   ASSISTANTS: none   ANESTHESIA:   local, regional, and IV sedation  EBL:  1 mL   BLOOD ADMINISTERED:none  DRAINS: none   LOCAL MEDICATIONS USED:  BUPIVICAINE   SPECIMEN:  Excision  DISPOSITION OF SPECIMEN:  PATHOLOGY  COUNTS:  YES  TOURNIQUET:   Total Tourniquet Time Documented: Upper Arm (Right) - 29 minutes Total: Upper Arm (Right) - 29 minutes   DICTATION: .Viviann Spare Dictation  PLAN OF CARE: Discharge to home after PACU  PATIENT DISPOSITION:  PACU - hemodynamically stable.   D

## 2021-08-30 NOTE — Anesthesia Preprocedure Evaluation (Addendum)
Anesthesia Evaluation  Patient identified by MRN, date of birth, ID band Patient awake    Reviewed: Allergy & Precautions, H&P , NPO status , Patient's Chart, lab work & pertinent test results, reviewed documented beta blocker date and time   Airway Mallampati: II  TM Distance: >3 FB Neck ROM: Full    Dental no notable dental hx. (+) Teeth Intact, Dental Advisory Given   Pulmonary former smoker,    Pulmonary exam normal breath sounds clear to auscultation       Cardiovascular hypertension, Pt. on medications and Pt. on home beta blockers + dysrhythmias Atrial Fibrillation  Rhythm:Regular Rate:Normal     Neuro/Psych negative neurological ROS  negative psych ROS   GI/Hepatic Neg liver ROS, GERD  ,  Endo/Other  negative endocrine ROS  Renal/GU negative Renal ROS     Musculoskeletal  (+) Arthritis , Osteoarthritis,    Abdominal   Peds  Hematology negative hematology ROS (+)   Anesthesia Other Findings   Reproductive/Obstetrics                            Anesthesia Physical  Anesthesia Plan  ASA: 3  Anesthesia Plan: MAC and Bier Block and Bier Block-LIDOCAINE ONLY   Post-op Pain Management:    Induction: Intravenous  PONV Risk Score and Plan: 1 and Propofol infusion, TIVA and Treatment may vary due to age or medical condition  Airway Management Planned: Simple Face Mask  Additional Equipment:   Intra-op Plan:   Post-operative Plan:   Informed Consent: I have reviewed the patients History and Physical, chart, labs and discussed the procedure including the risks, benefits and alternatives for the proposed anesthesia with the patient or authorized representative who has indicated his/her understanding and acceptance.     Dental advisory given  Plan Discussed with: CRNA  Anesthesia Plan Comments:         Anesthesia Quick Evaluation

## 2021-08-31 NOTE — Anesthesia Postprocedure Evaluation (Signed)
Anesthesia Post Note  Patient: Garrett Vargas  Procedure(s) Performed: RELEASE TRIGGER FINGER/A-1 PULLEY RIGHT RING FINGER (Right: Hand)     Patient location during evaluation: PACU Anesthesia Type: MAC Level of consciousness: awake and alert Pain management: pain level controlled Vital Signs Assessment: post-procedure vital signs reviewed and stable Respiratory status: spontaneous breathing Cardiovascular status: stable Anesthetic complications: no   No notable events documented.  Last Vitals:  Vitals:   08/30/21 1415 08/30/21 1427  BP: (!) 152/75 (!) 155/89  Pulse: (!) 58 63  Resp: 14 16  Temp:  (!) 36.2 C  SpO2: 97% 97%    Last Pain:  Vitals:   08/30/21 1427  TempSrc:   PainSc: 0-No pain                 Nolon Nations

## 2021-09-02 ENCOUNTER — Encounter (HOSPITAL_BASED_OUTPATIENT_CLINIC_OR_DEPARTMENT_OTHER): Payer: Self-pay | Admitting: Orthopedic Surgery

## 2021-09-02 LAB — SURGICAL PATHOLOGY

## 2021-09-05 NOTE — Progress Notes (Signed)
[facility] Summary Report / Loop Recorder 

## 2021-09-26 LAB — CUP PACEART REMOTE DEVICE CHECK
Date Time Interrogation Session: 20230221233726
Implantable Pulse Generator Implant Date: 20190909

## 2021-09-30 ENCOUNTER — Ambulatory Visit (INDEPENDENT_AMBULATORY_CARE_PROVIDER_SITE_OTHER): Payer: Medicare Other

## 2021-09-30 DIAGNOSIS — R55 Syncope and collapse: Secondary | ICD-10-CM | POA: Diagnosis not present

## 2021-10-03 NOTE — Progress Notes (Signed)
[facility] Summary Report / Loop Recorder 

## 2021-10-18 ENCOUNTER — Encounter: Payer: Self-pay | Admitting: Family Medicine

## 2021-10-18 ENCOUNTER — Ambulatory Visit (INDEPENDENT_AMBULATORY_CARE_PROVIDER_SITE_OTHER): Payer: Medicare Other | Admitting: Family Medicine

## 2021-10-18 VITALS — BP 116/68 | HR 65 | Temp 98.7°F | Ht 74.0 in | Wt 185.7 lb

## 2021-10-18 DIAGNOSIS — M7711 Lateral epicondylitis, right elbow: Secondary | ICD-10-CM

## 2021-10-18 DIAGNOSIS — E7800 Pure hypercholesterolemia, unspecified: Secondary | ICD-10-CM | POA: Diagnosis not present

## 2021-10-18 DIAGNOSIS — R351 Nocturia: Secondary | ICD-10-CM | POA: Diagnosis not present

## 2021-10-18 DIAGNOSIS — I10 Essential (primary) hypertension: Secondary | ICD-10-CM | POA: Diagnosis not present

## 2021-10-18 DIAGNOSIS — I700 Atherosclerosis of aorta: Secondary | ICD-10-CM

## 2021-10-18 DIAGNOSIS — N401 Enlarged prostate with lower urinary tract symptoms: Secondary | ICD-10-CM | POA: Diagnosis not present

## 2021-10-18 DIAGNOSIS — I480 Paroxysmal atrial fibrillation: Secondary | ICD-10-CM

## 2021-10-18 LAB — COMPREHENSIVE METABOLIC PANEL
ALT: 24 U/L (ref 0–53)
AST: 26 U/L (ref 0–37)
Albumin: 4.2 g/dL (ref 3.5–5.2)
Alkaline Phosphatase: 47 U/L (ref 39–117)
BUN: 15 mg/dL (ref 6–23)
CO2: 32 mEq/L (ref 19–32)
Calcium: 9.5 mg/dL (ref 8.4–10.5)
Chloride: 102 mEq/L (ref 96–112)
Creatinine, Ser: 0.92 mg/dL (ref 0.40–1.50)
GFR: 81 mL/min (ref >60.00)
Glucose, Bld: 88 mg/dL (ref 70–99)
Potassium: 4.3 mEq/L (ref 3.5–5.1)
Sodium: 140 mEq/L (ref 135–145)
Total Bilirubin: 1.1 mg/dL (ref 0.2–1.2)
Total Protein: 6.5 g/dL (ref 6.0–8.3)

## 2021-10-18 LAB — CBC WITH DIFFERENTIAL/PLATELET
Basophils Absolute: 0 10*3/uL (ref 0.0–0.1)
Basophils Relative: 0.5 % (ref 0.0–3.0)
Eosinophils Absolute: 0.2 10*3/uL (ref 0.0–0.7)
Eosinophils Relative: 3.6 % (ref 0.0–5.0)
HCT: 46.6 % (ref 39.0–52.0)
Hemoglobin: 15.5 g/dL (ref 13.0–17.0)
Lymphocytes Relative: 33.7 % (ref 12.0–46.0)
Lymphs Abs: 2.1 10*3/uL (ref 0.7–4.0)
MCHC: 33.2 g/dL (ref 30.0–36.0)
MCV: 91.5 fl (ref 78.0–100.0)
Monocytes Absolute: 0.5 10*3/uL (ref 0.1–1.0)
Monocytes Relative: 8.9 % (ref 3.0–12.0)
Neutro Abs: 3.3 10*3/uL (ref 1.4–7.7)
Neutrophils Relative %: 53.3 % (ref 43.0–77.0)
Platelets: 212 10*3/uL (ref 150.0–400.0)
RBC: 5.09 Mil/uL (ref 4.22–5.81)
RDW: 13.6 % (ref 11.5–15.5)
WBC: 6.2 10*3/uL (ref 4.0–10.5)

## 2021-10-18 LAB — LIPID PANEL
Cholesterol: 170 mg/dL (ref 0–200)
HDL: 52.9 mg/dL (ref >39.00)
LDL Cholesterol: 98 mg/dL (ref 0–99)
NonHDL: 117.32
Total CHOL/HDL Ratio: 3
Triglycerides: 98 mg/dL (ref 0.0–149.0)
VLDL: 19.6 mg/dL (ref 0.0–40.0)

## 2021-10-18 LAB — PSA: PSA: 1.31 ng/mL (ref 0.10–4.00)

## 2021-10-18 NOTE — Patient Instructions (Signed)
Call in August for appointment with Dr. Marcell Anger.  ? ? ?

## 2021-10-18 NOTE — Progress Notes (Signed)
?Garrett Vargas ?DOB: 1946/05/28 ?Encounter date: 10/18/2021 ? ?This is a 76 y.o. male who presents with ?Chief Complaint  ?Patient presents with  ? Follow-up  ? ? ?History of present illness: ?Last visit with me was 04/19/2021 for complete physical. ? ?About 2 weeks ago pulled lawnmower cord and had immediate pain lateral epicondyle. Starting to pick up something gets twinge of pain. Certain motions bother him. Not weak, no bruising, no swelling. No icing or braces. Very slowly getting better. Other day hit a few golf balls; worse the following day.  ? ? ?Hypertension: Atenolol 25 mg daily; doing well at home. Checks on occasion.  ? ?Hyperlipidemia/aortic atherosclerosis: Atorvastatin 40 mg daily, fish oil over-the-counter.  Atorvastatin was increased after last blood work since LDL is elevated. Done well with this. It was suggested to increase lipitor to '80mg'$ , but he was hesitant to make such a big jump. Worried about side effects with large jump.  ? ?Paroxysmal A-fib: Flecainide 50 mg twice daily, Xarelto 20 mg daily.  Does follow with cardiology. Has loop recorder and follows regularly with this.  ? ?Allergic rhinitis: Doing pretty well overall with allergies - not having nasal congestion anymore. Hasn't used the atrovent for last month. Did remodel kitchen- wonders if something in old kitchen was triggering nasal congestion.  ? ?BPH: doing well with urination. Stream ok.  ? ?History of skin cancer: Follows with Dr. Pearline Cables regularly.  ? ?Acid reflux: Prilosec 20 mg daily; will have sx on and off if not taking this.  ? ?No Known Allergies ?Current Meds  ?Medication Sig  ? atenolol (TENORMIN) 25 MG tablet TAKE ONE-HALF TABLET BY  MOUTH DAILY - MAY TAKE AN  EXTRA ONE-HALF TAB DAILY  FOR PALPITATIONS OR HEART  RACING  ? atorvastatin (LIPITOR) 40 MG tablet Take 1 tablet (40 mg total) by mouth daily.  ? flecainide (TAMBOCOR) 50 MG tablet TAKE 1 TABLET BY MOUTH  TWICE DAILY  ? ipratropium (ATROVENT) 0.03 % nasal spray  Place 1 spray into both nostrils as needed.  ? Multiple Vitamins-Minerals (CENTRUM SILVER PO) Take 1 capsule by mouth daily.  ? Omega-3 Fatty Acids (FISH OIL PO) Take 1,040 mg by mouth daily.  ? omeprazole (PRILOSEC) 20 MG capsule Take 20 mg by mouth daily.  ? rivaroxaban (XARELTO) 20 MG TABS tablet TAKE 1 TABLET BY MOUTH  DAILY WITH SUPPER  ? tadalafil (CIALIS) 5 MG tablet Take 5 mg by mouth daily as needed for erectile dysfunction.  ? traMADol (ULTRAM) 50 MG tablet Take 1 tablet (50 mg total) by mouth every 6 (six) hours as needed.  ? ? ?Review of Systems  ?Constitutional:  Negative for chills, fatigue and fever.  ?Respiratory:  Negative for cough, chest tightness, shortness of breath and wheezing.   ?Cardiovascular:  Negative for chest pain, palpitations and leg swelling.  ?Musculoskeletal:  Positive for arthralgias (right elbow, see HPI).  ? ?Objective: ? ?BP 116/68 (BP Location: Left Arm, Patient Position: Sitting, Cuff Size: Normal)   Pulse 65   Temp 98.7 ?F (37.1 ?C) (Oral)   Ht '6\' 2"'$  (1.88 m)   Wt 185 lb 11.2 oz (84.2 kg)   SpO2 98%   BMI 23.84 kg/m?   Weight: 185 lb 11.2 oz (84.2 kg)  ? ?BP Readings from Last 3 Encounters:  ?10/18/21 116/68  ?08/30/21 (!) 155/89  ?08/13/21 120/80  ? ?Wt Readings from Last 3 Encounters:  ?10/18/21 185 lb 11.2 oz (84.2 kg)  ?08/30/21 181 lb 14.1 oz (82.5 kg)  ?  08/13/21 185 lb 6.4 oz (84.1 kg)  ? ? ?Physical Exam ?Constitutional:   ?   General: He is not in acute distress. ?   Appearance: He is well-developed.  ?Cardiovascular:  ?   Rate and Rhythm: Normal rate and regular rhythm.  ?   Heart sounds: Normal heart sounds. No murmur heard. ?  No friction rub.  ?Pulmonary:  ?   Effort: Pulmonary effort is normal. No respiratory distress.  ?   Breath sounds: Normal breath sounds. No wheezing or rales.  ?Musculoskeletal:  ?   Right lower leg: No edema.  ?   Left lower leg: No edema.  ?   Comments: Tenderness lateral epicondyle. Mild discomfort with suppination/pronation. No  pain with flexion of elbow. No edema appreciated.   ?Neurological:  ?   Mental Status: He is alert and oriented to person, place, and time.  ?Psychiatric:     ?   Behavior: Behavior normal.  ? ? ?Assessment/Plan ? ?1. Essential hypertension, benign ?Well controlled. Continue with current medication.  ?- CBC with Differential/Platelet; Future ?- Comprehensive metabolic panel; Future ? ?2. BPH associated with nocturia ?Stable.  ?- PSA; Future ? ?3. Paroxysmal atrial fibrillation (HCC) ?Rate controlled, has remained in sinus rhythm.  ? ?4. Aortic atherosclerosis ([place]) ?Will recheck lipids. Continue with statin.  ? ?5. Pure hypercholesterolemia ?- Lipid panel; Future ? ?6. Lateral epicondylitis of right elbow ?Wear tennis elbow band, stretches, icing, avoid use when possible. If not better in 2 weeks let us know.  ? ?Return in about 6 months (around 04/20/2022) for physical exam. ? ? ? ? ? ? ?Micheline Rough, MD ?

## 2021-11-04 ENCOUNTER — Ambulatory Visit (INDEPENDENT_AMBULATORY_CARE_PROVIDER_SITE_OTHER): Payer: Medicare Other

## 2021-11-04 DIAGNOSIS — R55 Syncope and collapse: Secondary | ICD-10-CM

## 2021-11-04 LAB — CUP PACEART REMOTE DEVICE CHECK
Date Time Interrogation Session: 20230401001711
Implantable Pulse Generator Implant Date: 20190909

## 2021-11-13 ENCOUNTER — Telehealth: Payer: Self-pay

## 2021-11-13 NOTE — Telephone Encounter (Signed)
ILR reached RRT 11/12/21. Attempted to notify patient, no answer, lmtcb. ? ? ?

## 2021-11-13 NOTE — Telephone Encounter (Signed)
Patient returned call. Patient advised of RRT and no longer working. Discussed options to leave device in or remove.  ? ?Would like to leave device in.  ?

## 2021-11-14 ENCOUNTER — Ambulatory Visit (INDEPENDENT_AMBULATORY_CARE_PROVIDER_SITE_OTHER): Payer: Medicare Other

## 2021-11-14 VITALS — Ht 74.0 in | Wt 185.0 lb

## 2021-11-14 DIAGNOSIS — Z0000 Encounter for general adult medical examination without abnormal findings: Secondary | ICD-10-CM | POA: Diagnosis not present

## 2021-11-14 NOTE — Patient Instructions (Addendum)
?Garrett Vargas , ?Thank you for taking time to come for your Medicare Wellness Visit. I appreciate your ongoing commitment to your health goals. Please review the following plan we discussed and let me know if I can assist you in the future.  ? ?These are the goals we discussed: ? Goals   ? ?   Exercise 3x per week (30 min per time) (pt-stated)   ?   Be able to walk 18 holes for golf.  ?  ?   patient   ?   To maintain your health and exercise  ?Take time to travel and enjoy your time when pandemic settles  ? ?  ? ?  ?  ?This is a list of the screening recommended for you and due dates:  ?Health Maintenance  ?Topic Date Due  ? Flu Shot  03/04/2022  ? Tetanus Vaccine  05/08/2025  ? Colon Cancer Screening  11/19/2025  ? Pneumonia Vaccine  Completed  ? COVID-19 Vaccine  Completed  ? Hepatitis C Screening: USPSTF Recommendation to screen - Ages 64-79 yo.  Completed  ? Zoster (Shingles) Vaccine  Completed  ? HPV Vaccine  Aged Out  ? ?Opioid Pain Medicine Management ?Opioids are powerful medicines that are used to treat moderate to severe pain. When used for short periods of time, they can help you to: ?Sleep better. ?Do better in physical or occupational therapy. ?Feel better in the first few days after an injury. ?Recover from surgery. ?Opioids should be taken with the supervision of a trained health care provider. They should be taken for the shortest period of time possible. This is because opioids can be addictive, and the longer you take opioids, the greater your risk of addiction. This addiction can also be called opioid use disorder. ?What are the risks? ?Using opioid pain medicines for longer than 3 days increases your risk of side effects. Side effects include: ?Constipation. ?Nausea and vomiting. ?Breathing difficulties (respiratory depression). ?Drowsiness. ?Confusion. ?Opioid use disorder. ?Itching. ?Taking opioid pain medicine for a long period of time can affect your ability to do daily tasks. It also puts  you at risk for: ?Motor vehicle crashes. ?Depression. ?Suicide. ?Heart attack. ?Overdose, which can be life-threatening. ?What is a pain treatment plan? ?A pain treatment plan is an agreement between you and your health care provider. Pain is unique to each person, and treatments vary depending on your condition. To manage your pain, you and your health care provider need to work together. To help you do this: ?Discuss the goals of your treatment, including how much pain you might expect to have and how you will manage the pain. ?Review the risks and benefits of taking opioid medicines. ?Remember that a good treatment plan uses more than one approach and minimizes the chance of side effects. ?Be honest about the amount of medicines you take and about any drug or alcohol use. ?Get pain medicine prescriptions from only one health care provider. ?Pain can be managed with many types of alternative treatments. Ask your health care provider to refer you to one or more specialists who can help you manage pain through: ?Physical or occupational therapy. ?Counseling (cognitive behavioral therapy). ?Good nutrition. ?Biofeedback. ?Massage. ?Meditation. ?Non-opioid medicine. ?Following a gentle exercise program. ?How to use opioid pain medicine ?Taking medicine ?Take your pain medicine exactly as told by your health care provider. Take it only when you need it. ?If your pain gets less severe, you may take less than your prescribed dose if your health  care provider approves. ?If you are not having pain, do nottake pain medicine unless your health care provider tells you to take it. ?If your pain is severe, do nottry to treat it yourself by taking more pills than instructed on your prescription. Contact your health care provider for help. ?Write down the times when you take your pain medicine. It is easy to become confused while on pain medicine. Writing the time can help you avoid overdose. ?Take other over-the-counter or  prescription medicines only as told by your health care provider. ?Keeping yourself and others safe ? ?While you are taking opioid pain medicine: ?Do not drive, use machinery, or power tools. ?Do not sign legal documents. ?Do not drink alcohol. ?Do not take sleeping pills. ?Do not supervise children by yourself. ?Do not do activities that require climbing or being in high places. ?Do not go to a lake, river, ocean, spa, or swimming pool. ?Do not share your pain medicine with anyone. ?Keep pain medicine in a locked cabinet or in a secure area where pets and children cannot reach it. ?Stopping your use of opioids ?If you have been taking opioid medicine for more than a few weeks, you may need to slowly decrease (taper) how much you take until you stop completely. Tapering your use of opioids can decrease your risk of symptoms of withdrawal, such as: ?Pain and cramping in the abdomen. ?Nausea. ?Sweating. ?Sleepiness. ?Restlessness. ?Uncontrollable shaking (tremors). ?Cravings for the medicine. ?Do not attempt to taper your use of opioids on your own. Talk with your health care provider about how to do this. Your health care provider may prescribe a step-down schedule based on how much medicine you are taking and how long you have been taking it. ?Getting rid of leftover pills ?Do not save any leftover pills. Get rid of leftover pills safely by: ?Taking the medicine to a prescription take-back program. This is usually offered by the county or law enforcement. ?Bringing them to a pharmacy that has a drug disposal container. ?Flushing them down the toilet. Check the label or package insert of your medicine to see whether this is safe to do. ?Throwing them out in the trash. Check the label or package insert of your medicine to see whether this is safe to do. ?If it is safe to throw it out, remove the medicine from the original container, put it into a sealable bag or container, and mix it with used coffee grounds, food  scraps, dirt, or cat litter before putting it in the trash. ?Follow these instructions at home: ?Activity ?Do exercises as told by your health care provider. ?Avoid activities that make your pain worse. ?Return to your normal activities as told by your health care provider. Ask your health care provider what activities are safe for you. ?General instructions ?You may need to take these actions to prevent or treat constipation: ?Drink enough fluid to keep your urine pale yellow. ?Take over-the-counter or prescription medicines. ?Eat foods that are high in fiber, such as beans, whole grains, and fresh fruits and vegetables. ?Limit foods that are high in fat and processed sugars, such as fried or sweet foods. ?Keep all follow-up visits. This is important. ?Where to find support ?If you have been taking opioids for a long time, you may benefit from receiving support for quitting from a local support group or counselor. Ask your health care provider for a referral to these resources in your area. ?Where to find more information ?Centers for Disease Control and Prevention (  CDC): http://www.wolf.info/ ?U.[place] Food and Drug Administration (FDA): [employer].ch ?Get help right away if: ?You may have taken too much of an opioid (overdosed). Common symptoms of an overdose: ?Your breathing is slower or more shallow than normal. ?You have a very slow heartbeat (pulse). ?You have slurred speech. ?You have nausea and vomiting. ?Your pupils become very small. ?You have other potential symptoms: ?You are very confused. ?You faint or feel like you will faint. ?You have cold, clammy skin. ?You have blue lips or fingernails. ?You have thoughts of harming yourself or harming others. ?These symptoms may represent a serious problem that is an emergency. Do not wait to see if the symptoms will go away. Get medical help right away. Call your local emergency services (911 in the [place] Do not drive yourself to the hospital.  ?If you ever feel like you may  hurt yourself or others, or have thoughts about taking your own life, get help right away. Go to your nearest emergency department or: ?Call your local emergency services (911 in the [place] ?Call the [employer]

## 2021-11-14 NOTE — Progress Notes (Signed)
[facility] Summary Report / Loop Recorder 

## 2021-11-14 NOTE — Progress Notes (Signed)
? ?Subjective:  ? Garrett Vargas is a 76 y.o. male who presents for Medicare Annual/Subsequent preventive examination. ? ?Review of Systems    ?Virtual Visit via Telephone Note ? ?I connected with  Garrett Vargas on 11/14/21 at  9:15 AM EDT by telephone and verified that I am speaking with the correct person using two identifiers. ? ?Location: ?Patient: Home ?Provider: Office ?Persons participating in the virtual visit: patient/Nurse Health Advisor ?  ?I discussed the limitations, risks, security and privacy concerns of performing an evaluation and management service by telephone and the availability of in person appointments. The patient expressed understanding and agreed to proceed. ? ?Interactive audio and video telecommunications were attempted between this nurse and patient, however failed, due to patient having technical difficulties OR patient did not have access to video capability.  We continued and completed visit with audio only. ? ?Some vital signs may be absent or patient reported.  ? ?[name], LPN  ?Cardiac Risk Factors include: advanced age (>66mn, >>37women);hypertension;male gender ? ?   ?Objective:  ?  ?Today's Vitals  ? 11/14/21 0918  ?Weight: 185 lb (83.9 kg)  ?Height: '6\' 2"'$  (1.88 m)  ? ?Body mass index is 23.75 kg/m?. ? ? ?  11/14/2021  ?  9:26 AM 08/30/2021  ? 12:07 PM 08/21/2021  ? 11:00 AM 11/19/2020  ? 11:44 AM 11/12/2020  ?  9:17 AM 09/05/2019  ?  1:33 PM 10/11/2018  ? 10:27 AM  ?Advanced Directives  ?Does Patient Have a Medical Advance Directive? Yes Yes Yes Yes Yes Yes No  ?Type of AParamed[place]of [place] will  [place] will [place] will  [place] will   ?Does patient want to make changes to medical advance directive? No - Patient declined No - Patient declined   No - Patient declined No - Patient declined   ?Copy of HHarmonin Chart? No - copy requested    No - copy requested  No - copy requested   ?Would patient like information on creating a medical advance directive?       No - Patient declined  ? ? ?Current Medications (verified) ?Outpatient Encounter Medications as of 11/14/2021  ?Medication Sig  ? atenolol (TENORMIN) 25 MG tablet TAKE ONE-HALF TABLET BY  MOUTH DAILY - MAY TAKE AN  EXTRA ONE-HALF TAB DAILY  FOR PALPITATIONS OR HEART  RACING  ? atorvastatin (LIPITOR) 40 MG tablet Take 1 tablet (40 mg total) by mouth daily.  ? flecainide (TAMBOCOR) 50 MG tablet TAKE 1 TABLET BY MOUTH  TWICE DAILY  ? ipratropium (ATROVENT) 0.03 % nasal spray Place 1 spray into both nostrils as needed.  ? Multiple Vitamins-Minerals (CENTRUM SILVER PO) Take 1 capsule by mouth daily.  ? Omega-3 Fatty Acids (FISH OIL PO) Take 1,040 mg by mouth daily.  ? omeprazole (PRILOSEC) 20 MG capsule Take 20 mg by mouth daily.  ? rivaroxaban (XARELTO) 20 MG TABS tablet TAKE 1 TABLET BY MOUTH  DAILY WITH SUPPER  ? tadalafil (CIALIS) 5 MG tablet Take 5 mg by mouth daily as needed for erectile dysfunction.  ? traMADol (ULTRAM) 50 MG tablet Take 1 tablet (50 mg total) by mouth every 6 (six) hours as needed.  ? ?No facility-administered encounter medications on file as of 11/14/2021.  ? ? ?Allergies (verified) ?Patient has no known allergies.  ? ?History: ?Past Medical History:  ?Diagnosis Date  ? [place]   ?  ALLERGIC RHINITIS   ? Arthritis   ? Colon polyp   ? Dysrhythmia   ? Erectile dysfunction   ? GERD (gastroesophageal reflux disease)   ? History of bronchitis as a child   ? Hyperlipemia   ? Hypertension   ? S/P lateral meniscectomy of left knee   ? ?Past Surgical History:  ?Procedure Laterality Date  ? CARDIOVERSION N/A 03/17/2014  ? Procedure: CARDIOVERSION;  Surgeon: Sanda Klein, MD;  [facility] ENDOSCOPY;  Service: Cardiovascular;  Laterality: N/A;  ? COLONOSCOPY WITH PROPOFOL N/A 11/19/2020  ? Procedure: COLONOSCOPY WITH PROPOFOL;  Surgeon: Mansouraty, Telford Nab., MD;  Location: [facility]  ENDOSCOPY;  Service: Gastroenterology;  Laterality: N/A;  ? colonscopy     ? ELBOW ARTHROSCOPY Bilateral   ? ENDOSCOPIC MUCOSAL RESECTION N/A 11/19/2020  ? Procedure: ENDOSCOPIC MUCOSAL RESECTION;  Surgeon: [name] Telford Nab., MD;  Location: [facility] ENDOSCOPY;  Service: Gastroenterology;  Laterality: N/A;  ? HEMOSTASIS CLIP PLACEMENT  11/19/2020  ? Procedure: HEMOSTASIS CLIP PLACEMENT;  Surgeon: Irving Copas., MD;  Location: [facility] ENDOSCOPY;  Service: Gastroenterology;;  ? LOOP RECORDER INSERTION N/A 04/12/2018  ? Procedure: LOOP RECORDER INSERTION;  Surgeon: Evans Lance, MD;  Location: [place] CV LAB;  Service: Cardiovascular;  Laterality: N/A;  ? Multiple Knee,elbow and foot surgeries    ? neuroma left foot,2010, Dr. Anne Fu    ? POLYPECTOMY  11/19/2020  ? Procedure: POLYPECTOMY;  Surgeon: [name] Telford Nab., MD;  Location: [facility] ENDOSCOPY;  Service: Gastroenterology;;  ? SUBMUCOSAL LIFTING INJECTION  11/19/2020  ? Procedure: SUBMUCOSAL LIFTING INJECTION;  Surgeon: Irving Copas., MD;  Location: [facility] ENDOSCOPY;  Service: Gastroenterology;;  ? TEE WITHOUT CARDIOVERSION N/A 03/17/2014  ? Procedure: TRANSESOPHAGEAL ECHOCARDIOGRAM (TEE);  Surgeon: Sanda Klein, MD;  Location: [place];  Service: Cardiovascular;  Laterality: N/A;  ? TOTAL HIP ARTHROPLASTY Left 07/18/2015  ? Procedure: LEFT TOTAL HIP ARTHROPLASTY ANTERIOR APPROACH;  Surgeon: Gaynelle Arabian, MD;  Location: [facility];  Service: Orthopedics;  Laterality: Left;  ? TRIGGER FINGER RELEASE Right 08/30/2021  ? Procedure: RELEASE TRIGGER FINGER/A-1 PULLEY RIGHT RING FINGER;  Surgeon: Daryll Brod, MD;  Location: [place];  Service: Orthopedics;  Laterality: Right;  45 MIN  ? ?Family History  ?Problem Relation Age of Onset  ? Coronary artery disease Other   ? Prostate cancer Other   ? Stroke Other   ? Heart disease Mother   ? Stroke Mother 47  ? Prostate cancer Father 37  ? Kidney disease Sister   ? ?Social History   ? ?Socioeconomic History  ? Marital status: Married  ?  Spouse name: Not on file  ? Number of children: 3  ? Years of education: Not on file  ? Highest education level: Professional school degree (e.g., MD, DDS, DVM, JD)  ?Occupational History  ? Occupation: retired Chief Financial Officer  ? Occupation: former smoker  ? Occupation: alocohol use-yes  ? Occupation: Drug use-no  ? Occupation: regular exercise-yes  ?Tobacco Use  ? Smoking status: Former  ?  Packs/day: 0.50  ?  Years: 10.00  ?  Pack years: 5.00  ?  Types: Cigarettes  ?  Quit date: 08/05/1987  ?  Years since quitting: 34.3  ? Smokeless tobacco: Never  ?Vaping Use  ? Vaping Use: Never used  ?Substance and Sexual Activity  ? Alcohol use: Yes  ?  Alcohol/week: 0.0 standard drinks  ?  Comment: occasional  ? Drug use: No  ? Sexual activity: Not on file  ?Other Topics Concern  ?  Not on file  ?Social History Narrative  ? Not on file  ? ?Social Determinants of Health  ? ?Financial Resource Strain: Low Risk   ? Difficulty of Paying Living Expenses: Not hard at all  ?Food Insecurity: No Food Insecurity  ? Worried About Charity fundraiser in the Last Year: Never true  ? Ran Out of Food in the Last Year: Never true  ?Transportation Needs: No Transportation Needs  ? Lack of Transportation (Medical): No  ? Lack of Transportation (Non-Medical): No  ?Physical Activity: Insufficiently Active  ? Days of Exercise per Week: 3 days  ? Minutes of Exercise per Session: 30 min  ?Stress: No Stress Concern Present  ? Feeling of Stress : Only a little  ?Social Connections: Socially Integrated  ? Frequency of Communication with Friends and Family: More than three times a week  ? Frequency of Social Gatherings with Friends and Family: More than three times a week  ? Attends Religious Services: More than 4 times per year  ? Active Member of Clubs or Organizations: Yes  ? Attends Archivist Meetings: More than 4 times per year  ? Marital Status: Married  ? ? ?Tobacco  Counseling ?Counseling given: Not Answered ? ? ?Clinical Intake: ? ?Pre-visit preparation completed: Yes ? ?Pain : No/denies pain ? ?  ? ?BMI - recorded: 23.83 ?Nutritional Status: BMI of 19-24  Normal ?Nutritional Risks: Non

## 2021-11-18 DIAGNOSIS — H2513 Age-related nuclear cataract, bilateral: Secondary | ICD-10-CM | POA: Diagnosis not present

## 2021-11-18 DIAGNOSIS — H10413 Chronic giant papillary conjunctivitis, bilateral: Secondary | ICD-10-CM | POA: Diagnosis not present

## 2021-11-18 DIAGNOSIS — H0102A Squamous blepharitis right eye, upper and lower eyelids: Secondary | ICD-10-CM | POA: Diagnosis not present

## 2021-11-18 DIAGNOSIS — T7840XA Allergy, unspecified, initial encounter: Secondary | ICD-10-CM | POA: Diagnosis not present

## 2021-11-18 DIAGNOSIS — H0102B Squamous blepharitis left eye, upper and lower eyelids: Secondary | ICD-10-CM | POA: Diagnosis not present

## 2021-11-25 ENCOUNTER — Telehealth: Payer: Self-pay | Admitting: Pharmacist

## 2021-11-25 NOTE — Chronic Care Management (AMB) (Signed)
? ? ?Chronic Care Management ?Pharmacy Assistant  ? ?Name: Garrett Vargas  MRN: [id number] DOB: 05-30-1946 ? ?Reason for Encounter: Disease State / Hypertension Assessment Call ?  ?Conditions to be addressed/monitored: ?HTN ? ? ?Recent office visits:  ?11/14/2021 Rolene Arbour LPN - Encounter for Medicare annual wellness exam ? ?10/18/2021 Micheline Rough MD - Patient was seen for essential hypertension and additional issues. No medication changes. Follow up in 6 months.  ? ?Recent consult visits:  ?11/04/2021 Daryll Brod MD (ortho) - Patient was seen for trigger right finger of right hand and an additional issue. Discontinued Atorvastatin. Follow up in 3 weeks.  ? ?10/07/2021 Daryll Brod MD (ortho) - Patient was seen for trigger right finger of right hand and an additional issue. No medication changes. Follow up in 1 months.  ? ?09/13/2021  Daryll Brod MD (ortho) - Patient was seen for trigger right finger of right hand and an additional issue. No medication changes. Follow up in 3 weeks.  ? ?09/06/2020 Daryll Brod MD (ortho) - Patient was seen for trigger right finger of right hand. No other chart notes.  ? ?Hospital visits:  ?None ? ?Medications: ?Outpatient Encounter Medications as of 11/25/2021  ?Medication Sig  ? atenolol (TENORMIN) 25 MG tablet TAKE ONE-HALF TABLET BY  MOUTH DAILY - MAY TAKE AN  EXTRA ONE-HALF TAB DAILY  FOR PALPITATIONS OR HEART  RACING  ? atorvastatin (LIPITOR) 40 MG tablet Take 1 tablet (40 mg total) by mouth daily.  ? flecainide (TAMBOCOR) 50 MG tablet TAKE 1 TABLET BY MOUTH  TWICE DAILY  ? ipratropium (ATROVENT) 0.03 % nasal spray Place 1 spray into both nostrils as needed.  ? Multiple Vitamins-Minerals (CENTRUM SILVER PO) Take 1 capsule by mouth daily.  ? Omega-3 Fatty Acids (FISH OIL PO) Take 1,040 mg by mouth daily.  ? omeprazole (PRILOSEC) 20 MG capsule Take 20 mg by mouth daily.  ? rivaroxaban (XARELTO) 20 MG TABS tablet TAKE 1 TABLET BY MOUTH  DAILY WITH SUPPER  ? tadalafil (CIALIS) 5  MG tablet Take 5 mg by mouth daily as needed for erectile dysfunction.  ? traMADol (ULTRAM) 50 MG tablet Take 1 tablet (50 mg total) by mouth every 6 (six) hours as needed.  ? ?No facility-administered encounter medications on file as of 11/25/2021.  ?Fill History: ?ATENOLOL  25 MG TABS 09/10/2021 90  ? ?ATORVASTATIN CALCIUM  40 MG TABS 10/29/2021 90  ? ?FLECAINIDE ACETATE  50 MG TABS 09/10/2021 90  ? ?XARELTO  20 MG TABS 10/29/2021 90  ? ?TRAMADOL HCL 50 MG TABLET 08/30/2021 5  ? ?Reviewed chart prior to disease state call. Spoke with patient regarding BP ? ?Recent Office Vitals: ?BP Readings from Last 3 Encounters:  ?10/18/21 116/68  ?08/30/21 (!) 155/89  ?08/13/21 120/80  ? ?Pulse Readings from Last 3 Encounters:  ?10/18/21 65  ?08/30/21 63  ?08/13/21 68  ?  ?Wt Readings from Last 3 Encounters:  ?11/14/21 185 lb (83.9 kg)  ?10/18/21 185 lb 11.2 oz (84.2 kg)  ?08/30/21 181 lb 14.1 oz (82.5 kg)  ?  ? ?Kidney Function ?Lab Results  ?Component Value Date/Time  ? CREATININE 0.92 10/18/2021 09:44 AM  ? CREATININE 0.91 04/19/2021 10:51 AM  ? CREATININE 0.90 03/14/2020 08:40 AM  ? GFR 81.00 10/18/2021 09:44 AM  ? GFRNONAA >60 10/11/2018 10:41 AM  ? GFRAA >60 10/11/2018 10:41 AM  ? ? ? ?  Latest Ref Rng & Units 10/18/2021  ?  9:44 AM 04/19/2021  ? 10:51 AM 03/14/2020  ?  8:40 AM  ?BMP  ?Glucose 70 - 99 mg/dL 88   87   94    ?BUN 6 - 23 mg/dL '15   16   13    '$ ?Creatinine 0.40 - 1.50 mg/dL 0.92   0.91   0.90    ?BUN/Creat Ratio 6 - 22 (calc)   NOT APPLICABLE    ?Sodium 135 - 145 mEq/L 140   140   141    ?Potassium 3.5 - 5.1 mEq/L 4.3   4.4   4.4    ?Chloride 96 - 112 mEq/L 102   100   103    ?CO2 19 - 32 mEq/L 32   31   30    ?Calcium 8.4 - 10.5 mg/dL 9.5   9.5   9.6    ? ? ?Current antihypertensive regimen:  ?Atenolol 25 mg take 1 daily (patient states he has been taking 1 daily for a while) ? ?How often are you checking your Blood Pressure? Patient states he is checking his blood pressure weekly.  ? ?Current home BP readings:  Patient doesn't have a log but states they are always between 120/70 - 130/76. ? ?What recent interventions/DTPs have been made by any provider to improve Blood Pressure control since last CPP Visit: No recent interventions ? ?Any recent hospitalizations or ED visits since last visit with CPP? No recent hospitalizations or ED visits.  ? ?What diet changes have been made to improve Blood Pressure Control?  ?Patient doesn't add salt ?Breakfast - patient will have eggos, berries, sausage and orange juice. ?Lunch - patient will eat out and have anything from fast food, BBQ or a salad.  ?Dinner - patient will have a meal with a meat (chick, fish or beef) at salad, vegetable and potato.  ? ?What exercise is being done to improve your Blood Pressure Control?  ?Patient plays golf 2-3 times per week, he goes to the health club 2 times per week and does all his yard work. ? ?Adherence Review: ?Is the patient currently on ACE/ARB medication? No ?Does the patient have >5 day gap between last estimated fill dates? No ? ? ?Care Gaps: ?AWV - scheduled 11/17/2021 ?Last BP - 116/68 on 10/18/2021 ?  ?Star Rating Drugs: ?Atorvastatin '40mg'$  - last filled 10/29/2021 90 DS at [facility] ? ?Gennie Alma CMA  ?Clinical Pharmacist Assistant ?[phone] ? ?

## 2021-12-30 ENCOUNTER — Other Ambulatory Visit: Payer: Self-pay | Admitting: Family Medicine

## 2021-12-30 ENCOUNTER — Other Ambulatory Visit: Payer: Self-pay | Admitting: Internal Medicine

## 2021-12-31 NOTE — Telephone Encounter (Signed)
Age 76, weight 185 lbs, SCr 0.92 10/2021, CrCl 81 Last OV 08/2021, afib indication

## 2022-02-13 ENCOUNTER — Ambulatory Visit: Payer: Medicare Other | Admitting: Internal Medicine

## 2022-02-13 ENCOUNTER — Encounter: Payer: Self-pay | Admitting: Internal Medicine

## 2022-02-13 VITALS — BP 116/78 | HR 73 | Ht 74.0 in | Wt 181.0 lb

## 2022-02-13 DIAGNOSIS — R55 Syncope and collapse: Secondary | ICD-10-CM | POA: Diagnosis not present

## 2022-02-13 DIAGNOSIS — I10 Essential (primary) hypertension: Secondary | ICD-10-CM

## 2022-02-13 DIAGNOSIS — I480 Paroxysmal atrial fibrillation: Secondary | ICD-10-CM | POA: Diagnosis not present

## 2022-02-13 NOTE — Progress Notes (Signed)
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 He is a pleasant 76 yo man with a h/o PAF, tachy induced CM that resolved, remote tobacco abuse and syncope, s/p ILR insertion. Several months ago his atrial fib/flutter was increasing in frequency and he was started on low dose flecainide. He has done well in the interim. He has not had palpitations or syncope. No chest pain or sob. No edema No Known Allergies   Current Outpatient Medications  Medication Sig Dispense Refill   atenolol (TENORMIN) 25 MG tablet TAKE ONE-HALF TABLET BY  MOUTH DAILY - MAY TAKE AN  EXTRA ONE-HALF TAB DAILY  FOR PALPITATIONS OR HEART  RACING 90 tablet 3   atorvastatin (LIPITOR) 40 MG tablet TAKE 1 TABLET BY MOUTH  DAILY 90 tablet 3   flecainide (TAMBOCOR) 50 MG tablet TAKE 1 TABLET BY MOUTH  TWICE DAILY 180 tablet 3   ipratropium (ATROVENT) 0.03 % nasal spray Place 1 spray into both nostrils as needed.     Multiple Vitamins-Minerals (CENTRUM SILVER PO) Take 1 capsule by mouth daily.     Omega-3 Fatty Acids (FISH OIL PO) Take 1,040 mg by mouth daily.     omeprazole (PRILOSEC) 20 MG capsule Take 20 mg by mouth daily.     rivaroxaban (XARELTO) 20 MG TABS tablet TAKE 1 TABLET BY MOUTH DAILY  WITH SUPPER 90 tablet 1   tadalafil (CIALIS) 5 MG tablet Take 5 mg by mouth daily as needed for erectile dysfunction.     No current facility-administered medications for this visit.     Past Medical History:  Diagnosis Date   A-fib ([place])    ALLERGIC RHINITIS    Arthritis    Colon polyp    Dysrhythmia    Erectile dysfunction    GERD (gastroesophageal reflux disease)    History of bronchitis as a child    Hyperlipemia    Hypertension    S/P lateral meniscectomy of left knee     ROS:   All systems reviewed and negative except as noted in the HPI.   Past Surgical History:  Procedure Laterality Date   CARDIOVERSION N/A 03/17/2014   Procedure: CARDIOVERSION;  Surgeon: Sanda Klein, MD;  Location: [place];   Service: Cardiovascular;  Laterality: N/A;   COLONOSCOPY WITH PROPOFOL N/A 11/19/2020   Procedure: COLONOSCOPY WITH PROPOFOL;  Surgeon: Irving Copas., MD;  Location: [facility] ENDOSCOPY;  Service: Gastroenterology;  Laterality: N/A;   colonscopy      ELBOW ARTHROSCOPY Bilateral    ENDOSCOPIC MUCOSAL RESECTION N/A 11/19/2020   Procedure: ENDOSCOPIC MUCOSAL RESECTION;  Surgeon: [name] Telford Nab., MD;  Location: [facility] ENDOSCOPY;  Service: Gastroenterology;  Laterality: N/A;   HEMOSTASIS CLIP PLACEMENT  11/19/2020   Procedure: HEMOSTASIS CLIP PLACEMENT;  Surgeon: Irving Copas., MD;  Location: [facility]OS[facility];  Service: Gastroenterology;;   LOOP RECORDER INSERTION N/A 04/12/2018   Procedure: LOOP RECORDER INSERTION;  Surgeon: Evans Lance, MD;  Location: [place] [facility];  Service: Cardiovascular;  Laterality: N/A;   Multiple Knee,elbow and foot surgeries     neuroma left foot,2010, Dr. Anne Fu     POLYPECTOMY  11/19/2020   Procedure: POLYPECTOMY;  Surgeon: [name] Telford Nab., MD;  Location: [facility]OS[facility];  Service: Gastroenterology;;   Maryagnes Amos INJECTION  11/19/2020   Procedure: SUBMUCOSAL LIFTING INJECTION;  Surgeon: Irving Copas., MD;  Location: [facility]OS[facility];  Service: Gastroenterology;;   TEE WITHOUT CARDIOVERSION N/A 03/17/2014   Procedure: TRANSESOPHAGEAL ECHOCARDIOGRAM (TEE);  Surgeon: Sanda Klein,  MD;  Location: [place];  Service: Cardiovascular;  Laterality: N/A;   TOTAL HIP ARTHROPLASTY Left 07/18/2015   Procedure: LEFT TOTAL HIP ARTHROPLASTY ANTERIOR APPROACH;  Surgeon: Gaynelle Arabian, MD;  Location: [facility];  Service: Orthopedics;  Laterality: Left;   TRIGGER FINGER RELEASE Right 08/30/2021   Procedure: RELEASE TRIGGER FINGER/A-1 PULLEY RIGHT RING FINGER;  Surgeon: Daryll Brod, MD;  Location: [place];  Service: Orthopedics;  Laterality: Right;  38 MIN     Family History  Problem Relation Age of Onset   Coronary  artery disease Other    Prostate cancer Other    Stroke Other    Heart disease Mother    Stroke Mother 42   Prostate cancer Father 69   Kidney disease Sister      Social History   Socioeconomic History   Marital status: Married    Spouse name: Not on file   Number of children: 3   Years of education: Not on file   Highest education level: Professional school degree (e.g., MD, DDS, DVM, JD)  Occupational History   Occupation: retired Chief Financial Officer   Occupation: former smoker   Occupation: Chief Executive Officer use-yes   Occupation: Drug use-no   Occupation: regular exercise-yes  Tobacco Use   Smoking status: Former    Packs/day: 0.50    Years: 10.00    Total pack years: 5.00    Types: Cigarettes    Quit date: 08/05/1987    Years since quitting: 34.5   Smokeless tobacco: Never  Vaping Use   Vaping Use: Never used  Substance and Sexual Activity   Alcohol use: Yes    Alcohol/week: 0.0 standard drinks of alcohol    Comment: occasional   Drug use: No   Sexual activity: Not on file  Other Topics Concern   Not on file  Social History Narrative   Not on file   Social Determinants of Health   Financial Resource Strain: Low Risk  (11/14/2021)   Overall Financial Resource Strain (CARDIA)    Difficulty of Paying Living Expenses: Not hard at all  Food Insecurity: No Food Insecurity (11/14/2021)   Hunger Vital Sign    Worried About Running Out of Food in the Last Year: Never true    Ran Out of Food in the Last Year: Never true  Transportation Needs: No Transportation Needs (11/14/2021)   PRAPARE - Hydrologist (Medical): No    Lack of Transportation (Non-Medical): No  Physical Activity: Insufficiently Active (11/14/2021)   Exercise Vital Sign    Days of Exercise per Week: 3 days    Minutes of Exercise per Session: 30 min  Stress: No Stress Concern Present (11/14/2021)   R[place]o    Feeling of  Stress : Only a little  Social Connections: Socially Integrated (11/14/2021)   Social Connection and Isolation Panel [NHANES]    Frequency of Communication with Friends and Family: More than three times a week    Frequency of Social Gatherings with Friends and Family: More than three times a week    Attends Religious Services: More than 4 times per year    Active Member of [employer] or Organizations: Yes    Attends Archivist Meetings: More than 4 times per year    Marital Status: Married  Human resources officer Violence: Not At Risk (11/14/2021)   Humiliation, Afraid, Rape, and Kick questionnaire    Fear of Current or Ex-Partner: No  Emotionally Abused: No    Physically Abused: No    Sexually Abused: No     BP 116/78   Pulse 73   Ht '6\' 2"'$  (1.88 m)   Wt 181 lb (82.1 kg)   SpO2 95%   BMI 23.24 kg/m   Physical Exam:  Well appearing NAD HEENT: Unremarkable Neck:  No JVD, no thyromegally Lymphatics:  No adenopathy Back:  No CVA tenderness Lungs:  Clear HEART:  Regular rate rhythm, no murmurs, no rubs, no clicks Abd:  soft, positive bowel sounds, no organomegally, no rebound, no guarding Ext:  2 plus pulses, no edema, no cyanosis, no clubbing Skin:  No rashes no nodules Neuro:  CN II through XII intact, motor grossly intact  EKG - nsr with RBBB  DEVICE  ILR at RRT  Assess/Plan:  1. PAF - he is maintaining NSR. He will continue low dose flecainide. No QRS prolongation.  2. Syncope - he has had no recurrent episodes and his ILR demonstrates no prolonged pauses. 3. HTN - his bp is well controlled. We will follow. Continue atenolol 25 mg daily 4. 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 No change in his meds.   Carleene Overlie Evagelia Knack,MD

## 2022-02-13 NOTE — Patient Instructions (Addendum)

## 2022-02-18 ENCOUNTER — Telehealth: Payer: Self-pay | Admitting: Pharmacist

## 2022-02-18 NOTE — Chronic Care Management (AMB) (Signed)
    Chronic Care Management Pharmacy Assistant   Name: TRENNON TORBECK  MRN: [id number] DOB: [birth year]  Reason for Encounter: Reschedule August appointment   Rescheduled appointment with patient for 05/20/2022.  [place] Pharmacist Assistant [phone]

## 2022-03-18 ENCOUNTER — Telehealth: Payer: Medicare Other

## 2022-03-20 DIAGNOSIS — D225 Melanocytic nevi of trunk: Secondary | ICD-10-CM | POA: Diagnosis not present

## 2022-03-20 DIAGNOSIS — L814 Other melanin hyperpigmentation: Secondary | ICD-10-CM | POA: Diagnosis not present

## 2022-03-20 DIAGNOSIS — Z85828 Personal history of other malignant neoplasm of skin: Secondary | ICD-10-CM | POA: Diagnosis not present

## 2022-03-20 DIAGNOSIS — L570 Actinic keratosis: Secondary | ICD-10-CM | POA: Diagnosis not present

## 2022-03-20 DIAGNOSIS — Z08 Encounter for follow-up examination after completed treatment for malignant neoplasm: Secondary | ICD-10-CM | POA: Diagnosis not present

## 2022-03-20 DIAGNOSIS — M71372 Other bursal cyst, left ankle and foot: Secondary | ICD-10-CM | POA: Diagnosis not present

## 2022-03-20 DIAGNOSIS — L821 Other seborrheic keratosis: Secondary | ICD-10-CM | POA: Diagnosis not present

## 2022-04-10 ENCOUNTER — Ambulatory Visit (INDEPENDENT_AMBULATORY_CARE_PROVIDER_SITE_OTHER): Payer: Medicare Other | Admitting: Family Medicine

## 2022-04-10 ENCOUNTER — Encounter: Payer: Self-pay | Admitting: Family Medicine

## 2022-04-10 VITALS — BP 94/60 | HR 80 | Temp 98.2°F | Ht 74.0 in | Wt 179.5 lb

## 2022-04-10 DIAGNOSIS — I10 Essential (primary) hypertension: Secondary | ICD-10-CM

## 2022-04-10 DIAGNOSIS — Z23 Encounter for immunization: Secondary | ICD-10-CM | POA: Diagnosis not present

## 2022-04-10 DIAGNOSIS — E782 Mixed hyperlipidemia: Secondary | ICD-10-CM | POA: Diagnosis not present

## 2022-04-10 NOTE — Patient Instructions (Signed)
COVID booster coming soon  Also consider getting the RSV vaccine. You may go to any pharmacy in the area to have these done.

## 2022-04-10 NOTE — Progress Notes (Signed)
Established Patient Office Visit  Subjective   Patient ID: Garrett Vargas, male    DOB: [birth year]  Age: 76 y.o. MRN: [id number]  Chief Complaint  Patient presents with   Establish Care    Pt is here for transition of care visit.   HLD-- pt reports he was increased to 40 mg daily of the atorvastatin, states that they had talked about increasing to 80 mg however his LDL is 98 which is well controlled, he is not a smoker and does not have diabetes. No muscle cramps or aches.   HTN -- BP in office performed and is well controlled. She reports no side effects to the medications, no chest pain, SOB, dizziness or headaches. She has a BP cuff at home and is checking her BP regularly, reports they are in the normal range.     Current Outpatient Medications  Medication Instructions   atenolol (TENORMIN) 25 MG tablet TAKE ONE-HALF TABLET BY  MOUTH DAILY - MAY TAKE AN  EXTRA ONE-HALF TAB DAILY  FOR PALPITATIONS OR HEART  RACING   atorvastatin (LIPITOR) 40 MG tablet TAKE 1 TABLET BY MOUTH  DAILY   flecainide (TAMBOCOR) 50 MG tablet TAKE 1 TABLET BY MOUTH  TWICE DAILY   ipratropium (ATROVENT) 0.03 % nasal spray 1 spray, Each Nare, As needed   Multiple Vitamins-Minerals (CENTRUM SILVER PO) 1 capsule, Oral, Daily   Omega-3 Fatty Acids (FISH OIL PO) 1,040 mg, Oral, Daily   omeprazole (PRILOSEC) 20 mg, Oral, Daily   rivaroxaban (XARELTO) 20 MG TABS tablet TAKE 1 TABLET BY MOUTH DAILY  WITH SUPPER   tadalafil (CIALIS) 5 mg, Oral, Daily PRN    Patient Active Problem List   Diagnosis Date Noted   Aortic atherosclerosis ([place]) 11/12/2020   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     Objective:     BP 94/60 (BP Location: Left Arm, Patient Position: Sitting, Cuff Size: Normal)   Pulse 80   Temp 98.2 F (36.8 C) (Oral)   Ht '6\' 2"'$  (1.88 m)   Wt 179 lb 8 oz (81.4 kg)   SpO2 98%   BMI 23.05 kg/m  BP Readings from Last 3 Encounters:  04/10/22 94/60  02/13/22 116/78  10/18/21 116/68      Physical Exam Vitals reviewed.  Constitutional:      Appearance: Normal appearance. He is well-groomed and normal weight.  HENT:     Head: Normocephalic and atraumatic.  Eyes:     Extraocular Movements: Extraocular movements intact.     Conjunctiva/sclera: Conjunctivae normal.     Pupils: Pupils are equal, round, and reactive to light.  Cardiovascular:     Rate and Rhythm: Normal rate and regular rhythm.     Pulses: Normal pulses.     Heart sounds: S1 normal and S2 normal. No murmur heard. Pulmonary:     Effort: Pulmonary effort is normal.     Breath sounds: Normal breath sounds and air entry. No rales.  Abdominal:     General: Abdomen is flat. Bowel sounds are  normal.     Palpations: Abdomen is soft.     Tenderness: There is no abdominal tenderness.  Musculoskeletal:     Right lower leg: No edema.     Left lower leg: No edema.  Neurological:     General: No focal deficit present.     Mental Status: He is alert and oriented to person, place, and time.     Gait: Gait is intact.  Psychiatric:        Mood and Affect: Mood and affect normal.      No results found for any visits on 04/10/22.  Last metabolic panel Lab Results  Component Value Date   GLUCOSE 88 10/18/2021   NA 140 10/18/2021   K 4.3 10/18/2021   CL 102 10/18/2021   CO2 32 10/18/2021   BUN 15 10/18/2021   CREATININE 0.92 10/18/2021   GFRNONAA >60 10/11/2018   CALCIUM 9.5 10/18/2021   PROT 6.5 10/18/2021   ALBUMIN 4.2 10/18/2021   BILITOT 1.1  10/18/2021   ALKPHOS 47 10/18/2021   AST 26 10/18/2021   ALT 24 10/18/2021   ANIONGAP 8 10/11/2018   Last lipids Lab Results  Component Value Date   CHOL 170 10/18/2021   HDL 52.90 10/18/2021   LDLCALC 98 10/18/2021   TRIG 98.0 10/18/2021   CHOLHDL 3 10/18/2021      The 10-year ASCVD risk score (Arnett DK, et al., 2019) is: 17.4%    Assessment & Plan:   Problem List Items Addressed This Visit       Cardiovascular and Mediastinum   ESSENTIAL HYPERTENSION, BENIGN    Current hypertension medications:       Sig   atenolol (TENORMIN) 25 MG tablet (Taking) TAKE ONE-HALF TABLET BY  MOUTH DAILY - MAY TAKE AN  EXTRA ONE-HALF TAB DAILY  FOR PALPITATIONS OR HEART  RACING    BP is well controlled on the atenolol, will continue this medication as prescribed above.          Other   Hyperlipemia    Reviewed his last set of labs. He does not have many risk factors for CVD, his last LDL was <100, will continue the 40 mg dose of lipitor daily. RTC in 6 months for his annual labs.      Other Visit Diagnoses     Immunization due    -  Primary   Need for immunization against influenza       Relevant Orders   Flu Vaccine QUAD High Dose(Fluad) (Completed)       Return in about 6 months (around 10/09/2022) for Annual physical exam.    Farrel Conners, MD

## 2022-04-11 NOTE — Assessment & Plan Note (Signed)
Reviewed his last set of labs. He does not have many risk factors for CVD, his last LDL was <100, will continue the 40 mg dose of lipitor daily. RTC in 6 months for his annual labs.

## 2022-04-11 NOTE — Assessment & Plan Note (Signed)
Current hypertension medications:      Sig   atenolol (TENORMIN) 25 MG tablet (Taking) TAKE ONE-HALF TABLET BY  MOUTH DAILY - MAY TAKE AN  EXTRA ONE-HALF TAB DAILY  FOR PALPITATIONS OR HEART  RACING    BP is well controlled on the atenolol, will continue this medication as prescribed above.

## 2022-04-16 ENCOUNTER — Other Ambulatory Visit: Payer: Self-pay | Admitting: Internal Medicine

## 2022-04-16 DIAGNOSIS — I480 Paroxysmal atrial fibrillation: Secondary | ICD-10-CM

## 2022-04-16 NOTE — Telephone Encounter (Signed)
Xarelto '20mg'$  refill request received. Pt is 76 years old, weight-81.4kg, Crea-0.92 on 10/18/2021, last seen by Dr. Lovena Le on 02/13/2022, Diagnosis-Afib, CrCl-78.65 mL/min; Dose is appropriate based on dosing criteria. Will send in refill to requested pharmacy.

## 2022-05-19 ENCOUNTER — Telehealth: Payer: Self-pay | Admitting: Pharmacist

## 2022-05-19 ENCOUNTER — Other Ambulatory Visit: Payer: Self-pay | Admitting: Physician Assistant

## 2022-05-19 NOTE — Chronic Care Management (AMB) (Signed)
    Chronic Care Management Pharmacy Assistant   Name: CYRIL WOODMANSEE  MRN: [id number] DOB: March 27, 1946  05/20/2022 [place], No answer, left message of appointment on 05/20/2022 at 11:30 via telephone visit with Jeni Salles, Pharm D. Notified to have all medications, supplements, blood pressure and/or blood sugar logs available during appointment and to return call if need to reschedule.  Care Gaps: AWV - scheduled 11/18/2022 Last BP - 94/60 on 04/10/2022 Covid - overdue  Star Rating Drug: Atorvastatin 40 mg - last filled 04/27/2022 100 DS at [facility]  Any gaps in medications fill history? No  Gennie Alma [facility]  Catering manager [phone]

## 2022-05-20 ENCOUNTER — Ambulatory Visit: Payer: Medicare Other | Admitting: Pharmacist

## 2022-05-20 DIAGNOSIS — E782 Mixed hyperlipidemia: Secondary | ICD-10-CM

## 2022-05-20 DIAGNOSIS — I10 Essential (primary) hypertension: Secondary | ICD-10-CM

## 2022-05-20 NOTE — Progress Notes (Signed)
Chronic Care Management Pharmacy Note  05/20/2022 Name:  Garrett Vargas MRN:  [id number] DOB:  12-21-1945  Summary: LDL at goal < 100 BP at gola < 130/80   Recommendations/Changes made from today's visit: -Consider stopping fish oil depending on results of lipid panel   Plan: Follow up as needed  Subjective: Garrett Vargas is an 76 y.o. year old male who is a primary patient of Legrand Como, Royston Cowper, MD.  The CCM team was consulted for assistance with disease management and care coordination needs.    Engaged with patient by telephone for follow up visit in response to provider referral for pharmacy case management and/or care coordination services.   Consent to Services:  The patient was given information about Chronic Care Management services, agreed to services, and gave verbal consent prior to initiation of services.  Please see initial visit note for detailed documentation.   Patient Care Team: Farrel Conners, MD as PCP - General (Family Medicine) Macario Carls, MD as Referring Physician (Dermatology) Viona Gilmore, [facility] as Pharmacist (Pharmacist)  Recent office visits: 04/10/22 Loralyn Freshwater, MD: Patient presented for [facility] visit. Follow up in 6 months for CPE. Influenza vaccine administered.  11/12/20 [name], LPN: Patient presented for AWV.  10/05/20 Micheline Rough, MD: Patient presented for chronic conditions follow up.   Recent consult visits: 02/13/22 [name], MD (cardiology): Patient presented for Afib follow up. Follow up in 1 year.  11/27/21 Daryll Brod, MD (ortho): Patient presented for follow up for trigger finger 3 months post op. Follow up PRN.  Hospital visits: None in previous 6 months  Objective:  Lab Results  Component Value Date   CREATININE 0.92 10/18/2021   BUN 15 10/18/2021   GFR 81.00 10/18/2021   GFRNONAA >60 10/11/2018   GFRAA >60 10/11/2018   NA 140 10/18/2021   K 4.3 10/18/2021   CALCIUM 9.5 10/18/2021   CO2 32  10/18/2021   GLUCOSE 88 10/18/2021    Lab Results  Component Value Date/Time   HGBA1C 5.5 01/17/2019 10:02 AM   HGBA1C 5.5 03/02/2013 08:48 AM   GFR 81.00 10/18/2021 09:44 AM   GFR 82.36 04/19/2021 10:51 AM    Last diabetic Eye exam: No results found for: "HMDIABEYEEXA"  Last diabetic Foot exam: No results found for: "HMDIABFOOTEX"   Lab Results  Component Value Date   CHOL 170 10/18/2021   HDL 52.90 10/18/2021   LDLCALC 98 10/18/2021   TRIG 98.0 10/18/2021   CHOLHDL 3 10/18/2021       Latest Ref Rng & Units 10/18/2021    9:44 AM 06/10/2021    9:34 AM 04/19/2021   10:51 AM  Hepatic Function  Total Protein 6.0 - 8.3 g/dL 6.5  6.5  6.5   Albumin 3.5 - 5.2 g/dL 4.2  4.2  4.2   AST 0 - 37 U/L 26  22  22    ALT 0 - 53 U/L 24  20  18    Alk Phosphatase 39 - 117 U/L 47  50  50   Total Bilirubin 0.2 - 1.2 mg/dL 1.1  1.1  1.7   Bilirubin, Direct 0.0 - 0.3 mg/dL  0.2      Lab Results  Component Value Date/Time   TSH 2.08 04/19/2021 10:51 AM   TSH 2.47 07/15/2018 07:49 AM       Latest Ref Rng & Units 10/18/2021    9:44 AM 04/19/2021   10:51 AM 03/14/2020    8:40 AM  CBC  WBC 4.0 - 10.5 K/uL 6.2  6.0  5.9   Hemoglobin 13.0 - 17.0 g/dL 15.5  15.6  16.0   Hematocrit 39.0 - 52.0 % 46.6  46.9  48.1   Platelets 150.0 - 400.0 K/uL 212.0  214.0  223     No results found for: "VD25OH"  Clinical ASCVD: Yes  The 10-year ASCVD risk score (Arnett DK, et al., 2019) is: 17.4%   Values used to calculate the score:     Age: 58 years     Sex: Male     Is Non-Hispanic African American: No     Diabetic: No     Tobacco smoker: No     Systolic Blood Pressure: 94 mmHg     Is BP treated: Yes     HDL Cholesterol: 52.9 mg/dL     Total Cholesterol: 170 mg/dL       04/10/2022   10:25 AM 11/14/2021    9:23 AM 10/18/2021    9:04 AM  Depression screen PHQ 2/9  Decreased Interest 0 0 0  Down, Depressed, Hopeless 0 0 0  PHQ - 2 Score 0 0 0  Altered sleeping 0 0 1  Tired, decreased energy 0 0  0  Change in appetite 0 0 0  Feeling bad or failure about yourself  0 0 0  Trouble concentrating 0 0 0  Moving slowly or fidgety/restless 0 0 0  Suicidal thoughts 0 0 0  PHQ-9 Score 0 0 1  Difficult doing work/chores  Not difficult at all Not difficult at all     CHA2DS2/VAS Stroke Risk Points  Current as of 11 minutes ago     3 >= 2 Points: High Risk  1 - 1.99 Points: Medium Risk  0 Points: Low Risk    Last Change:       Details    This score determines the patient's risk of having a stroke if the  patient has atrial fibrillation.       Points Metrics  0 Has Congestive Heart Failure:  No    Current as of 11 minutes ago  0 Has Vascular Disease:  No    Current as of 11 minutes ago  1 Has Hypertension:  Yes    Current as of 11 minutes ago  2 Age:  47    Current as of 11 minutes ago  0 Has Diabetes:  No    Current as of 11 minutes ago  0 Had Stroke:  No  Had TIA:  No  Had Thromboembolism:  No    Current as of 11 minutes ago  0 Male:  No    Current as of 11 minutes ago      Social History   Tobacco Use  Smoking Status Former   Packs/day: 0.50   Years: 10.00   Total pack years: 5.00   Types: Cigarettes   Quit date: 08/05/1987   Years since quitting: 34.8  Smokeless Tobacco Never   BP Readings from Last 3 Encounters:  04/10/22 94/60  02/13/22 116/78  10/18/21 116/68   Pulse Readings from Last 3 Encounters:  04/10/22 80  02/13/22 73  10/18/21 65   Wt Readings from Last 3 Encounters:  04/10/22 179 lb 8 oz (81.4 kg)  02/13/22 181 lb (82.1 kg)  11/14/21 185 lb (83.9 kg)   BMI Readings from Last 3 Encounters:  04/10/22 23.05 kg/m  02/13/22 23.24 kg/m  11/14/21 23.75 kg/m    Assessment/Interventions: Review of patient past medical  history, allergies, medications, health status, including review of consultants reports, laboratory and other test data, was performed as part of comprehensive evaluation and provision of chronic care management services.    SDOH:  (Social Determinants of Health) assessments and interventions performed: Yes  SDOH Interventions    Flowsheet Row Chronic Care Management from 05/20/2022 in [place] at [place] from 11/14/2021 in [place] at [place] Management from 11/14/2020 in [place] at [place] from 11/12/2020 in [place] at [place] Interventions -- Intervention Not Indicated -- Intervention Not Indicated  Housing Interventions -- Intervention Not Indicated -- --  Transportation Interventions Intervention Not Indicated Intervention Not Indicated Intervention Not Indicated Intervention Not Indicated  Financial Strain Interventions -- Intervention Not Indicated Intervention Not Indicated Intervention Not Indicated  Physical Activity Interventions -- Intervention Not Indicated -- Intervention Not Indicated  Stress Interventions -- Intervention Not Indicated -- --  Social Connections Interventions -- Intervention Not Indicated -- Intervention Not Indicated       SDOH Screenings   Food Insecurity: No Food Insecurity (11/14/2021)  Housing: Low Risk  (11/14/2021)  Transportation Needs: No Transportation Needs (05/20/2022)  Alcohol Screen: Low Risk  (11/14/2021)  Depression (PHQ2-9): Low Risk  (04/10/2022)  Financial Resource Strain: Low Risk  (11/14/2021)  Physical Activity: Insufficiently Active (11/14/2021)  Social Connections: Socially Integrated (11/14/2021)  Stress: No Stress Concern Present (11/14/2021)  Tobacco Use: Medium Risk (04/10/2022)   [place]  No Known Allergies  Medications Reviewed Today     Reviewed by Viona Gilmore, [facility] (Pharmacist) on 05/20/22 at 1134  Med List Status: <None>   Medication Order Taking? Sig Documenting Provider Last Dose Status Informant  atenolol (TENORMIN) 25 MG tablet [id number]  TAKE ONE-HALF TABLET BY MOUTH  DAILY . MAY TAKE AN  EXTRA  ONE-HALF TABLET BY MOUTH FOR  PALPITATIONS OR HEART RACING [place], [place]  Active   atorvastatin (LIPITOR) 40 MG tablet [id number]  TAKE 1 TABLET BY MOUTH  DAILY Dutch Quint B, FNP  Active   flecainide (TAMBOCOR) 50 MG tablet [id number]  TAKE 1 TABLET BY MOUTH TWICE  DAILY [place], PA-C  Active   ipratropium (ATROVENT) 0.03 % nasal spray [id number]  Place 1 spray into both nostrils as needed. [provider]  Active Self  Multiple Vitamins-Minerals (CENTRUM SILVER PO) [id number]  Take 1 capsule by mouth daily. [provider]  Active Self  Omega-3 Fatty Acids (FISH OIL PO) [id number] Yes Take 1,040 mg by mouth daily. [provider] Taking Active Self  omeprazole (PRILOSEC) 20 MG capsule [id number]  Take 20 mg by mouth daily. [provider]  Active Self  rivaroxaban (XARELTO) 20 MG TABS tablet [id number]  TAKE 1 TABLET BY MOUTH DAILY  WITH SUPPER Evans Lance, MD  Active   tadalafil (CIALIS) 5 MG tablet [id number]  Take 5 mg by mouth daily as needed for erectile dysfunction. 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 07/17/2019   Basal cell carcinoma (BCC) of neck 07/17/2019   Syncope and collapse 04/12/2018   BPH associated with nocturia 04/06/2014   HEMATURIA, HX OF 01/04/2009   ESSENTIAL HYPERTENSION, BENIGN 12/04/2008   Hyperlipemia 01/03/2008   ERECTILE DYSFUNCTION, MILD  01/03/2008   Allergic rhinitis 01/03/2008   Paroxysmal atrial fibrillation ([place]) 04/20/2007    Immunization History  Administered Date(s) Administered   Fluad Quad(high Dose 65+) 04/19/2021, 04/10/2022   Hepatitis A 07/02/2012, 01/10/2013   Influenza Nasal 05/04/2017   Influenza Split 04/27/2012   Influenza Whole 05/04/2009   Influenza, High Dose Seasonal PF 06/04/2018   Influenza,inj,Quad  PF,6+ Mos 05/05/2019   Influenza-Unspecified 05/04/2014, 05/07/2015, 04/28/2016, 05/04/2020   Moderna Covid-19 Vaccine Bivalent Booster 70yr & up 05/05/2022   PFIZER(Purple Top)SARS-COV-2 Vaccination 08/17/2019, 09/05/2019, 04/12/2020, 11/13/2020   PPD Test 04/16/2011, 04/27/2012, 04/27/2013, 04/11/2014   Pfizer Covid-19 Vaccine Bivalent Booster 166yr& up 08/17/2019, 05/06/2021   Pfizer Covid-19 Vaccine Bivalent Booster 5y-11y 09/05/2019   Pneumococcal Conjugate-13 04/06/2014   Pneumococcal Polysaccharide-23 12/10/2010   Respiratory Syncytial Virus Vaccine,Recomb Aduvanted(Arexvy) 05/01/2022   Td 01/08/2005   Tdap 05/09/2015   Zoster Recombinat (Shingrix) 09/02/2018, 02/23/2019   Zoster, Live 08/13/2009   Patient reports he is feeling well overall. He is still regularly checking his BP at home 1-2 times a week and sees numbers in the same range, never high. He is using an arm BP cuff and both him and his wife have brought it into their respective PCPs offices to make sure it was pretty accurate. He denies any symptoms of hypotension and did not have any symptoms when in the office with a lower BP of 94/60.  Conditions to be addressed/monitored:  Hypertension, Hyperlipidemia, Atrial Fibrillation, BPH and Allergic Rhinitis  Conditions addressed this visit: Hypertension, Hyperlipidemia, Atrial fibrillation  Care Plan : CCM Pharmacy Care Plan  Updates made by PrViona GilmoreRPDanielsince 05/20/2022 12:00 AM     Problem: Problem: Hypertension, Hyperlipidemia, Atrial Fibrillation, BPH and Allergic Rhinitis      Long-Range Goal: Patient-Specific Goal   Start Date: 11/14/2020  Expected End Date: 11/14/2021  Recent Progress: On track  Priority: High  Note:   Current Barriers:  Unable to independently monitor therapeutic efficacy  Pharmacist Clinical Goal(s):  Patient will achieve adherence to monitoring guidelines and medication adherence to achieve therapeutic efficacy through  collaboration with PharmD and provider.   Interventions: 1:1 collaboration with KoCaren MacadamMD regarding development and update of comprehensive plan of care as evidenced by provider attestation and co-signature Inter-disciplinary care team collaboration (see longitudinal plan of care) Comprehensive medication review performed; medication list updated in electronic medical record  Hypertension (BP goal <130/80) -Controlled -Current treatment: Atenolol 25 mg 1 tablet daily - Appropriate, Effective, Safe, Accessible -Medications previously tried: none  -Current home readings: 120s/70-80s (1-2 times a week) -Current dietary habits: rarely adds salt and wife tries to find foods with lower salt content by looking at package labels -Current exercise habits: active almost every day; playing golf and [place] in normal routine) -Denies hypotensive/hypertensive symptoms -Educated on Exercise goal of 150 minutes per week; Importance of home blood pressure monitoring; Proper BP monitoring technique; -Counseled to monitor BP at home weekly, document, and provide log at future appointments -Counseled on diet and exercise extensively Recommended to continue current medication  Hyperlipidemia: (LDL goal < 100) -Controlled -Current treatment: Atorvastatin 40 mg 1 tablet daily - Appropriate, Effective, Safe, Accessible Fish oil 1000 mg 1 capsule daily - Query Appropriate, Query effective, Safe, Accessible -Medications previously tried: niacin -Current dietary patterns: limits fried foods -Current exercise habits: active almost daily -Educated on Cholesterol goals;  Benefits of statin for ASCVD risk reduction; Importance of limiting foods high in cholesterol; -Counseled  on diet and exercise extensively Recommended to continue current medication Counseled on minimal benefit with fish oil.  Atrial Fibrillation (Goal: prevent stroke and major  bleeding) -Controlled -CHADSVASC: 3 -Current treatment: Rate/rhythm control: atenolol 25 mg 1 tablet daily; flecainide 50 mg 1 table twice daily - Appropriate, Effective, Safe, Accessible Anticoagulation: Xarelto 20 mg 1 tablet daily with supper - Appropriate, Effective, Safe, Accessible -Medications previously tried: n/a -Home BP and HR readings: HR 70-80s  -Counseled on bleeding risk associated with Xarelto and importance of self-monitoring for signs/symptoms of bleeding; avoidance of NSAIDs due to increased bleeding risk with anticoagulants; -Recommended to continue current medication  GERD (Goal: minimize symptoms) -Controlled -Current treatment  Omeprazole 20 mg 1 capsule daily - Appropriate, Effective, Query Safe, Accessible -Medications previously tried: none  -Counseled on non-pharmacologic management of symptoms such as elevating the head of your bed, avoiding eating 2-3 hours before bed, avoiding triggering foods such as acidic, spicy, or fatty foods, eating smaller meals, and wearing clothes that are loose around the waist  ED (Goal: minimize symptoms) -Controlled -Current treatment  tadalafil 5 mg 1 tablet daily as needed - Appropriate, Effective, Safe, Accessible -Medications previously tried: none  -Recommended to continue current medication  Health Maintenance -Vaccine gaps: none -Current therapy:  Ipratropium 0.03% nasal spray 1 spray in both nostrils daily Multivitamin 1 tablet daily -Educated on Cost vs benefit of each product must be carefully weighed by individual consumer -Patient is satisfied with current therapy and denies issues -Recommended to continue current medication  Patient Goals/Self-Care Activities Patient will:  - check blood pressure weekly, document, and provide at future appointments target a minimum of 150 minutes of moderate intensity exercise weekly engage in dietary modifications by increasing fiber intake  Follow Up Plan: The patient  has been provided with contact information for the care management team and has been advised to call with any health related questions or concerns.        Medication Assistance: None required.  Patient affirms current coverage meets needs.  Compliance/Adherence/Medication fill history: Care Gaps: COVID booster Last BP - 94/60 on 04/10/2022  Star-Rating Drugs: Atorvastatin 40 mg - last filled 04/27/2022 100 DS at [facility]  Patient's preferred pharmacy is:  [place], [address] [place] [address] Phone: [phone] Fax: [phone]   Uses pill box? Yes - in AM/PM Pt endorses 99% compliance - every 3-4 months  We discussed: Current pharmacy is preferred with insurance plan and patient is satisfied with pharmacy services Patient decided to: Continue current medication management strategy  Care Plan and Follow Up Patient Decision:  Patient agrees to Care Plan and Follow-up.  Plan: The patient has been provided with contact information for the care management team and has been advised to call with any health related questions or concerns.   Jeni Salles, PharmD [facility] Clinical Pharmacist [place] at [place]

## 2022-05-20 NOTE — Patient Instructions (Signed)
Hi Garrett Vargas,  It was great to catch up again! I updated your chart with the vaccines so you should be all up to date with everything now.  Please reach out to me if you have any questions or need anything!  Best, Maddie  Jeni Salles, PharmD, [facility] [place]   Visit Information   Goals Addressed   None    Patient Care Plan: CCM Pharmacy Care Plan     Problem Identified: Problem: Hypertension, Hyperlipidemia, Atrial Fibrillation, BPH and Allergic Rhinitis      Long-Range Goal: Patient-Specific Goal   Start Date: 11/14/2020  Expected End Date: 11/14/2021  Recent Progress: On track  Priority: High  Note:   Current Barriers:  Unable to independently monitor therapeutic efficacy  Pharmacist Clinical Goal(s):  Patient will achieve adherence to monitoring guidelines and medication adherence to achieve therapeutic efficacy through collaboration with PharmD and provider.   Interventions: 1:1 collaboration with Caren Macadam, MD regarding development and update of comprehensive plan of care as evidenced by provider attestation and co-signature Inter-disciplinary care team collaboration (see longitudinal plan of care) Comprehensive medication review performed; medication list updated in electronic medical record  Hypertension (BP goal <130/80) -Controlled -Current treatment: Atenolol 25 mg 1 tablet daily - Appropriate, Effective, Safe, Accessible -Medications previously tried: none  -Current home readings: 120s/70-80s (1-2 times a week) -Current dietary habits: rarely adds salt and wife tries to find foods with lower salt content by looking at package labels -Current exercise habits: active almost every day; playing golf and going to [employer] (not been in normal routine) -Denies hypotensive/hypertensive symptoms -Educated on Exercise goal of 150 minutes per week; Importance of home blood pressure  monitoring; Proper BP monitoring technique; -Counseled to monitor BP at home weekly, document, and provide log at future appointments -Counseled on diet and exercise extensively Recommended to continue current medication  Hyperlipidemia: (LDL goal < 100) -Controlled -Current treatment: Atorvastatin 40 mg 1 tablet daily - Appropriate, Effective, Safe, Accessible Fish oil 1000 mg 1 capsule daily - Query Appropriate, Query effective, Safe, Accessible -Medications previously tried: niacin -Current dietary patterns: limits fried foods -Current exercise habits: active almost daily -Educated on Cholesterol goals;  Benefits of statin for ASCVD risk reduction; Importance of limiting foods high in cholesterol; -Counseled on diet and exercise extensively Recommended to continue current medication Counseled on minimal benefit with fish oil.  Atrial Fibrillation (Goal: prevent stroke and major bleeding) -Controlled -CHADSVASC: 3 -Current treatment: Rate/rhythm control: atenolol 25 mg 1 tablet daily; flecainide 50 mg 1 table twice daily - Appropriate, Effective, Safe, Accessible Anticoagulation: Xarelto 20 mg 1 tablet daily with supper - Appropriate, Effective, Safe, Accessible -Medications previously tried: n/a -Home BP and HR readings: HR 70-80s  -Counseled on bleeding risk associated with Xarelto and importance of self-monitoring for signs/symptoms of bleeding; avoidance of NSAIDs due to increased bleeding risk with anticoagulants; -Recommended to continue current medication  GERD (Goal: minimize symptoms) -Controlled -Current treatment  Omeprazole 20 mg 1 capsule daily - Appropriate, Effective, Query Safe, Accessible -Medications previously tried: none  -Counseled on non-pharmacologic management of symptoms such as elevating the head of your bed, avoiding eating 2-3 hours before bed, avoiding triggering foods such as acidic, spicy, or fatty foods, eating smaller meals, and wearing clothes  that are loose around the waist  ED (Goal: minimize symptoms) -Controlled -Current treatment  tadalafil 5 mg 1 tablet daily as needed - Appropriate, Effective, Safe, Accessible -Medications previously tried: none  -Recommended to  continue current medication  Health Maintenance -Vaccine gaps: none -Current therapy:  Ipratropium 0.03% nasal spray 1 spray in both nostrils daily Multivitamin 1 tablet daily -Educated on Cost vs benefit of each product must be carefully weighed by individual consumer -Patient is satisfied with current therapy and denies issues -Recommended to continue current medication  Patient Goals/Self-Care Activities Patient will:  - check blood pressure weekly, document, and provide at future appointments target a minimum of 150 minutes of moderate intensity exercise weekly engage in dietary modifications by increasing fiber intake  Follow Up Plan: The patient has been provided with contact information for the care management team and has been advised to call with any health related questions or concerns.        Patient verbalizes understanding of instructions and care plan provided today and agrees to view in [place]. Active MyChart status and patient understanding of how to access instructions and care plan via MyChart confirmed with patient.    The patient has been provided with contact information for the care management team and has been advised to call with any health related questions or concerns.   Viona Gilmore, [facility]

## 2022-08-12 DIAGNOSIS — R52 Pain, unspecified: Secondary | ICD-10-CM | POA: Diagnosis not present

## 2022-08-12 DIAGNOSIS — M65351 Trigger finger, right little finger: Secondary | ICD-10-CM | POA: Diagnosis not present

## 2022-08-27 DIAGNOSIS — T7840XD Allergy, unspecified, subsequent encounter: Secondary | ICD-10-CM | POA: Diagnosis not present

## 2022-09-23 DIAGNOSIS — M65332 Trigger finger, left middle finger: Secondary | ICD-10-CM | POA: Diagnosis not present

## 2022-10-16 ENCOUNTER — Other Ambulatory Visit: Payer: Self-pay | Admitting: Internal Medicine

## 2022-10-16 ENCOUNTER — Other Ambulatory Visit: Payer: Self-pay | Admitting: Family

## 2022-10-16 DIAGNOSIS — I480 Paroxysmal atrial fibrillation: Secondary | ICD-10-CM

## 2022-10-16 NOTE — Telephone Encounter (Signed)
Xarelto '20mg'$  refill request received. Pt is 77 years old, weight-81.4kg, Crea-0.92 on 10/18/21, last seen by Dr. Lovena Le on 02/13/22, Diagnosis-Afib, CrCl- 77.42 mL/min; Dose is appropriate based on dosing criteria. Will send in refill to requested pharmacy.

## 2022-10-24 DIAGNOSIS — M67911 Unspecified disorder of synovium and tendon, right shoulder: Secondary | ICD-10-CM | POA: Diagnosis not present

## 2022-11-03 DIAGNOSIS — M65332 Trigger finger, left middle finger: Secondary | ICD-10-CM | POA: Diagnosis not present

## 2022-11-06 ENCOUNTER — Telehealth: Payer: Self-pay | Admitting: Family Medicine

## 2022-11-06 NOTE — Telephone Encounter (Signed)
[place] to schedule their annual wellness visit. Appointment made for 11/18/22.  Garrett Vargas AWV direct phone # [phone]   Moved 11/18/22 appt from [facility] schedule to [facility] schedule

## 2022-11-13 DIAGNOSIS — M7661 Achilles tendinitis, right leg: Secondary | ICD-10-CM | POA: Diagnosis not present

## 2022-11-13 DIAGNOSIS — M25511 Pain in right shoulder: Secondary | ICD-10-CM | POA: Diagnosis not present

## 2022-11-17 ENCOUNTER — Other Ambulatory Visit: Payer: Self-pay | Admitting: *Deleted

## 2022-11-17 MED ORDER — ATORVASTATIN CALCIUM 40 MG PO TABS: 40.0000 mg | ORAL_TABLET | Freq: Every day | ORAL | 0 refills | Status: DC

## 2022-11-18 ENCOUNTER — Ambulatory Visit (INDEPENDENT_AMBULATORY_CARE_PROVIDER_SITE_OTHER): Payer: Medicare Other | Admitting: Family Medicine

## 2022-11-18 VITALS — Ht 74.0 in | Wt 180.0 lb

## 2022-11-18 DIAGNOSIS — Z0000 Encounter for general adult medical examination without abnormal findings: Secondary | ICD-10-CM

## 2022-11-18 NOTE — Patient Instructions (Signed)
I really enjoyed getting to talk with you today! I am available on Tuesdays and Thursdays for virtual visits if you have any questions or concerns, or if I can be of any further assistance.   CHECKLIST FROM ANNUAL WELLNESS VISIT:  -Follow up (please call to schedule if not scheduled after visit):   -yearly for annual wellness visit with primary care office  Here is a list of your preventive care/health maintenance measures and the plan for each if any are due:  PLAN For any measures below that may be due:  -can get covid booster at the pharmacy  Health Maintenance  Topic Date Due   COVID-19 Vaccine (9 - 2023-24 season) 06/30/2022   INFLUENZA VACCINE  03/05/2023   Medicare Annual Wellness (AWV)  11/18/2023   DTaP/Tdap/Td (3 - Td or Tdap) 05/08/2025   COLONOSCOPY (Pts 45-70yrs Insurance coverage will need to be confirmed)  11/19/2025   Pneumonia Vaccine 45+ Years old  Completed   Hepatitis C Screening  Completed   Zoster Vaccines- Shingrix  Completed   HPV VACCINES  Aged Out    -See a dentist at least yearly  -Get your eyes checked and then per your eye specialist's recommendations  -Other issues addressed today:   -I have included below further information regarding a healthy whole foods based diet, physical activity guidelines for adults, stress management and opportunities for social connections. I hope you find this information useful.   -----------------------------------------------------------------------------------------------------------------------------------------------------------------------------------------------------------------------------------------------------------  NUTRITION: -eat real food: lots of colorful vegetables (half the plate) and fruits -5-7 servings of vegetables and fruits per day (fresh or steamed is best), exp. 2 servings of vegetables with lunch and dinner and 2 servings of fruit per day. Berries and greens such as kale and collards are  great choices.  -consume on a regular basis: whole grains (make sure first ingredient on label contains the word "whole"), fresh fruits, fish, nuts, seeds, healthy oils (such as olive oil, avocado oil, grape seed oil) -may eat small amounts of dairy and lean meat on occasion, but avoid processed meats such as ham, bacon, lunch meat, etc. -drink water -try to avoid fast food and pre-packaged foods, processed meat -most experts advise limiting sodium to <  per day, should limit further is any chronic conditions such as high blood pressure, heart disease, diabetes, etc. The American Heart Association advised that <  is is ideal -try to avoid foods that contain any ingredients with names you do not recognize  -try to avoid sugar/sweets (except for the natural sugar that occurs in fresh fruit) -try to avoid sweet drinks -try to avoid white rice, white bread, pasta (unless whole grain), white or yellow potatoes  EXERCISE GUIDELINES FOR ADULTS: -if you wish to increase your physical activity, do so gradually and with the approval of your doctor -STOP and seek medical care immediately if you have any chest pain, chest discomfort or trouble breathing when starting or increasing exercise  -move and stretch your body, legs, feet and arms when sitting for long periods -Physical activity guidelines for optimal health in adults: -least 150 minutes per week of aerobic exercise (can talk, but not sing) once approved by your doctor, 20-30 minutes of sustained activity or two 10 minute episodes of sustained activity every day.  -resistance training at least 2 days per week if approved by your doctor -balance exercises 3+ days per week:   Stand somewhere where you have something sturdy to hold onto if you lose balance.    1) lift  up on toes, start with 5x per day and work up to 20x   2) stand and lift on leg straight out to the side so that foot is a few inches of the floor, start with 5x each side and  work up to 20x each side   3) stand on one foot, start with 5 seconds each side and work up to 20 seconds on each side  If you need ideas or help with getting more active:  -Silver sneakers https://tools.silversneakers.com  -Walk with a Doc: http://www.duncan-williams.com/  -try to include resistance (weight lifting/strength building) and balance exercises twice per week: or the following link for ideas: http://castillo-powell.com/  BuyDucts.dk  STRESS MANAGEMENT: -can try meditating, or just sitting quietly with deep breathing while intentionally relaxing all parts of your body for 5 minutes daily -if you need further help with stress, anxiety or depression please follow up with your primary doctor or contact the wonderful folks at [facility]: [phone]  SOCIAL CONNECTIONS: -options in [place] if you wish to engage in more social and exercise related activities:  -Silver sneakers https://tools.silversneakers.com  -Walk with a Doc: http://www.duncan-williams.com/  -Check out the [facility] Active Adults 50+ section on the [place] [employer] (hiking clubs, book clubs, cards and games, chess, exercise classes, aquatic classes and much more) - see the website for details: https://www.[place].gov/departments/parks-recreation/active-adults50  -YouTube has lots of exercise videos for different ages and abilities as well  -[facility] (a variety of indoor and outdoor inperson activities for adults). [phone]. [address].  -Virtual Online Classes (a variety of topics): see seniorplanet.org or call [phone]  -consider volunteering at a school, hospice center, church, senior center or elsewhere

## 2022-11-18 NOTE — Progress Notes (Signed)
MEDICARE WELLNESS VISIT PATIENT CHECK-IN and HEALTH RISK ASSESSMENT QUESTIONNAIRE:  -completed by phone/video for upcoming Medicare Preventive Visit  Pre-Visit Check-in: 1)Vitals (height, wt, BP, etc) - record in vitals section for visit on day of visit 2)Review and Update Medications, Allergies PMH, Surgeries, Social history in Epic 3)Hospitalizations in the last year with date/reason? No   4)Review and Update Care Team (patient's specialists) in Epic 5) Complete PHQ9 in Epic  6) Complete Fall Screening in Epic 7)Review all Health Maintenance Due and order under PCP if not done.  8)Medicare Wellness Questionnaire: Answer theses question about your habits: Do you drink alcohol? yes How many drinks do you have a day? 1-2 Have you ever smoked? yes Have you stopped smoking and date if applicable? Stopped 30+ years ago  How many packs a day do you smoke? ? Do you use smokeless tobacco? N Do you use illicit drugs? N Do you exercises? Yes If so, what type and how many days/minutes per week? 2-3 days - right now is going to a health club, 20 minutes on a stationary bike and then weights/strength training.  Are you sexually active?    Minimally     Number of partners?  1 What did you eat for breakfast today (or yesterday)? Yes Typical breakfast   2 ego waffles, sausage and fruit What did you eat for lunch today (or yesterday)? Yes Typical lunch varies - salad, hamburger, tuna fish sandwich  What did you eat for diner today (or yesterday)?Yes Typical dinner varies steak or beef, salmon or fish, spaghetti Typical snacks: hershey kisses What beverages do you drink besides water: iced tea, caffiene free pepsi  Answer theses question about you: Can you perform most household chores? Y Do you find it hard to follow a conversation in a noisy room? N Do you find it hard to understand a speaker at church or in a meeting? N Do you often ask people to speak up or repeat themselves? N  Do you  experience ringing in your ears? N Do you have difficulty understanding a soft or whispered voice? N Do you feel that you have a problem with memory? N Do you often misplace items? N Do you balance your checkbook and or bank acounts? Y Do you feel safe at home? Y Last dentist visit? GOING TODAY Do you need assistance with any of the following:  Driving? N  Feeding yourself? N  Getting from bed to chair? N  Getting to the toilet? N   Bathing or showering? N   Dressing yourself? N   Managing money? N   Climbing a flight of stairs? N  Preparing meals? N  Do you have Advanced Directives in place (Living Will, Healthcare Power or Attorney)? Y   Last eye Exam and location? WITH IN THE LAST YEAR   Do you currently use prescribed or non-prescribed narcotic or opioid pain medications? N  Do you have a history or close family history of breast, ovarian, tubal or peritoneal cancer or a family member with BRCA 1/2 (breast cancer susceptibility 1 and 2) gene mutations? N   Nurse/Assistant Credentials/time stamp  T. Ladaysha Soutar- CMA 9:43 AM    ----------------------------------------------------------------------------------------------------------------------------------------------------------------------------------------------------------------------    MEDICARE ANNUAL PREVENTIVE CARE VISIT WITH PROVIDER (Welcome to Medicare, initial annual wellness or annual wellness exam)  Virtual Visit via Phone Note  I connected with Garrett Vargas on 11/18/22  by phone  and verified that I am speaking with the correct person using two identifiers.  Location patient:  home Location provider:work or home office Persons participating in the virtual visit: patient, provider  Concerns and/or follow up today: reports is doing well for the most part. He did have some orthopedic issues recently and now is doing PT - is recovering. Is interested staying stronger so that he does not have muscle injury in the  [place] - strains/sprains.    See HM section in Epic for other details of completed HM.    ROS: negative for report of fevers, unintentional weight loss, vision changes, vision loss, hearing loss or change, chest pain, sob, hemoptysis, melena, hematochezia, hematuria, falls, bleeding or bruising, thoughts of suicide or self harm, memory loss  Patient-completed extensive health risk assessment - reviewed and discussed with the patient: See Health Risk Assessment completed with patient prior to the visit either above or in recent phone note. This was reviewed in detailed with the patient today and appropriate recommendations, orders and referrals were placed as needed per Summary below and patient instructions.   Review of Medical History: -PMH, PSH, Family History and current specialty and care providers reviewed and updated and listed below   Patient Care Team: Karie Georges, MD as PCP - General (Family Medicine) Wynona Canes, MD as Referring Physician (Dermatology) Verner Chol, [facility] (Inactive) as Pharmacist (Pharmacist)   Past Medical History:  Diagnosis Date   A-fib [facility])    ALLERGIC RHINITIS    Arthritis    Colon polyp    Dysrhythmia    Erectile dysfunction    GERD (gastroesophageal reflux disease)    History of bronchitis as a child    Hyperlipemia    Hypertension    S/P lateral meniscectomy of left knee     Past Surgical History:  Procedure Laterality Date   CARDIOVERSION N/A 03/17/2014   Procedure: CARDIOVERSION;  Surgeon: Thurmon Fair, MD;  Location: [facility];  Service: Cardiovascular;  Laterality: N/A;   COLONOSCOPY WITH PROPOFOL N/A 11/19/2020   Procedure: COLONOSCOPY WITH PROPOFOL;  Surgeon: Lemar Lofty., MD;  Location: [facility];  Service: Gastroenterology;  Laterality: N/A;   colonscopy      ELBOW ARTHROSCOPY Bilateral    ENDOSCOPIC MUCOSAL RESECTION N/A 11/19/2020   Procedure: ENDOSCOPIC MUCOSAL RESECTION;  Surgeon: Meridee Score  Netty Starring., MD;  Location: [facility];  Service: Gastroenterology;  Laterality: N/A;   HEMOSTASIS CLIP PLACEMENT  11/19/2020   Procedure: HEMOSTASIS CLIP PLACEMENT;  Surgeon: Lemar Lofty., MD;  Location: [facility];  Service: Gastroenterology;;   LOOP RECORDER INSERTION N/A 04/12/2018   Procedure: LOOP RECORDER INSERTION;  Surgeon: Marinus Maw, MD;  Location: [facility];  Service: Cardiovascular;  Laterality: N/A;   Multiple Knee,elbow and foot surgeries     neuroma left foot,2010, Dr. Vida Roller     POLYPECTOMY  11/19/2020   Procedure: POLYPECTOMY;  Surgeon: Meridee Score Netty Starring., MD;  Location: [facility];  Service: Gastroenterology;;   Brooke Dare INJECTION  11/19/2020   Procedure: SUBMUCOSAL LIFTING INJECTION;  Surgeon: Lemar Lofty., MD;  Location: [facility];  Service: Gastroenterology;;   TEE WITHOUT CARDIOVERSION N/A 03/17/2014   Procedure: TRANSESOPHAGEAL ECHOCARDIOGRAM (TEE);  Surgeon: Thurmon Fair, MD;  Location: [facility];  Service: Cardiovascular;  Laterality: N/A;   TOTAL HIP ARTHROPLASTY Left 07/18/2015   Procedure: LEFT TOTAL HIP ARTHROPLASTY ANTERIOR APPROACH;  Surgeon: Ollen Gross, MD;  Location: [facility];  Service: Orthopedics;  Laterality: Left;   TRIGGER FINGER RELEASE Right 08/30/2021   Procedure: RELEASE TRIGGER FINGER/A-1 PULLEY RIGHT RING FINGER;  Surgeon: Cindee Salt,  MD;  Location: [facility];  Service: Orthopedics;  Laterality: Right;  45 MIN    Social History   Socioeconomic History   Marital status: Married    Spouse name: Not on file   Number of children: 3   Years of education: Not on file   Highest education level: Professional school degree (e.g., MD, DDS, DVM, JD)  Occupational History   Occupation: retired Transport planner   Occupation: former smoker   Occupation: Engineer, structural use-yes   Occupation: Drug use-no   Occupation: regular exercise-yes  Tobacco Use   Smoking status: Former     Packs/day: 0.50    Years: 10.00    Additional pack years: 0.00    Total pack years: 5.00    Types: Cigarettes    Quit date: 08/05/1987    Years since quitting: 35.3   Smokeless tobacco: Never  Vaping Use   Vaping Use: Never used  Substance and Sexual Activity   Alcohol use: Yes    Alcohol/week: 0.0 standard drinks of alcohol    Comment: occasional   Drug use: No   Sexual activity: Not on file  Other Topics Concern   Not on file  Social History Narrative   Not on file   Social Determinants of Health   Financial Resource Strain: Low Risk  (11/14/2021)   Overall Financial Resource Strain (CARDIA)    Difficulty of Paying Living Expenses: Not hard at all  Food Insecurity: No Food Insecurity (11/14/2021)   Hunger Vital Sign    Worried About Running Out of Food in the Last Year: Never true    Ran Out of Food in the Last Year: Never true  Transportation Needs: No Transportation Needs (05/20/2022)   PRAPARE - Administrator, Civil Service (Medical): No    Lack of Transportation (Non-Medical): No  Physical Activity: Insufficiently Active (11/14/2021)   Exercise Vital Sign    Days of Exercise per Week: 3 days    Minutes of Exercise per Session: 30 min  Stress: No Stress Concern Present (11/14/2021)   [employer] of Occupational Health - Occupational Stress Questionnaire    Feeling of Stress : Only a little  Social Connections: Socially Integrated (11/14/2021)   Social Connection and Isolation Panel [NHANES]    Frequency of Communication with Friends and Family: More than three times a week    Frequency of Social Gatherings with Friends and Family: More than three times a week    Attends Religious Services: More than 4 times per year    Active Member of [employer] or Organizations: Yes    Attends Engineer, structural: More than 4 times per year    Marital Status: Married  Catering manager Violence: Not At Risk (11/14/2021)   Humiliation, Afraid, Rape, and Kick  questionnaire    Fear of Current or Ex-Partner: No    Emotionally Abused: No    Physically Abused: No    Sexually Abused: No    Family History  Problem Relation Age of Onset   Coronary artery disease Other    Prostate cancer Other    Stroke Other    Heart disease Mother    Stroke Mother 88   Prostate cancer Father 51   Kidney disease Sister     Current Outpatient Medications on File Prior to Visit  Medication Sig Dispense Refill   atenolol (TENORMIN) 25 MG tablet TAKE ONE-HALF TABLET BY MOUTH  DAILY . MAY TAKE AN EXTRA  ONE-HALF TABLET BY MOUTH FOR  PALPITATIONS OR HEART RACING 100 tablet 2   atorvastatin (LIPITOR) 40 MG tablet Take 1 tablet (40 mg total) by mouth daily. 90 tablet 0   flecainide (TAMBOCOR) 50 MG tablet TAKE 1 TABLET BY MOUTH TWICE  DAILY 200 tablet 2   ipratropium (ATROVENT) 0.03 % nasal spray Place 1 spray into both nostrils as needed.     Multiple Vitamins-Minerals (CENTRUM SILVER PO) Take 1 capsule by mouth daily.     Omega-3 Fatty Acids (FISH OIL PO) Take 1,040 mg by mouth daily.     omeprazole (PRILOSEC) 20 MG capsule Take 20 mg by mouth daily.     rivaroxaban (XARELTO) 20 MG TABS tablet TAKE 1 TABLET BY MOUTH DAILY  WITH SUPPER 100 tablet 1   tadalafil (CIALIS) 5 MG tablet Take 5 mg by mouth daily as needed for erectile dysfunction.     No current facility-administered medications on file prior to visit.    No Known Allergies     Physical Exam There were no vitals filed for this visit. Estimated body mass index is 23.11 kg/m as calculated from the following:   Height as of this encounter: 6\' 2"  (1.88 m).   Weight as of this encounter: 180 lb (81.6 kg).  EKG (optional): deferred due to virtual visit  GENERAL: alert, oriented, no acute distress detected; full vision exam deferred due to phone encounter  PSYCH/NEURO: pleasant and cooperative, no obvious depression or anxiety, speech and thought processing grossly intact, Cognitive function grossly  intact  Flowsheet Row Office Visit from 04/10/2022 in [facility] [place]  PHQ-9 Total Score 0           11/18/2022    9:33 AM 04/10/2022   10:25 AM 11/14/2021    9:23 AM 10/18/2021    9:04 AM 04/19/2021   10:43 AM  Depression screen PHQ 2/9  Decreased Interest 0 0 0 0 0  Down, Depressed, Hopeless 0 0 0 0 0  PHQ - 2 Score 0 0 0 0 0  Altered sleeping  0 0 1 1  Tired, decreased energy  0 0 0 0  Change in appetite  0 0 0 0  Feeling bad or failure about yourself   0 0 0 0  Trouble concentrating  0 0 0 0  Moving slowly or fidgety/restless  0 0 0 0  Suicidal thoughts  0 0 0 0  PHQ-9 Score  0 0 1 1  Difficult doing work/chores   Not difficult at all Not difficult at all        10/18/2021    9:04 AM 11/13/2021    4:57 PM 11/14/2021    9:25 AM 04/10/2022   10:27 AM 11/18/2022    9:33 AM  Fall Risk  Falls in the past year? 0 0 0 0 0  Was there an injury with Fall? 0 0 0 0 0  Fall Risk Category Calculator 0 0 0 0 0  Fall Risk Category (Retired) Low Low Low Low   (RETIRED) Patient Fall Risk Level Low fall risk  Low fall risk Low fall risk   Patient at Risk for Falls Due to No Fall Risks  No Fall Risks No Fall Risks No Fall Risks  Fall risk Follow up Falls evaluation completed   Falls evaluation completed Falls evaluation completed     SUMMARY AND PLAN:  Encounter for Medicare annual wellness exam   Discussed applicable health maintenance/preventive health measures and advised and referred or ordered per patient  preferences:  Health Maintenance  Topic Date Due   COVID-19 Vaccine (9 - 2023-24 season) 06/30/2022, discussed current recommendations, can get at pharmacy   INFLUENZA VACCINE  03/05/2023   Medicare Annual Wellness (AWV)  11/18/2023   DTaP/Tdap/Td (3 - Td or Tdap) 05/08/2025   COLONOSCOPY (Pts 45-89yrs Insurance coverage will need to be confirmed)  11/19/2025   Pneumonia Vaccine 20+ Years old  Completed   Hepatitis C Screening  Completed   Zoster  Vaccines- Shingrix  Completed   HPV VACCINES  Aged Out  He has had his [facility] vaccine at [facility] 2023. Asked nurse assistant to abstract to his record.   Education and counseling on the following was provided based on the above review of health and a plan/checklist for the patient, along with additional information discussed, was provided for the patient in the patient instructions :   -Provided safe balance exercises that can be done at home to improve balance and discussed exercise guidelines for adults with include balance exercises at least 3 days per week.  -Advised and counseled on a healthy lifestyle - including the importance of a healthy diet, regular physical activity -information about social connections and stress management provided - see patient instructions -Reviewed patient's current diet. Advised and counseled on a whole foods based healthy diet. A summary of a healthy diet was provided in the Patient Instructions.  -reviewed patient's current physical activity level and discussed exercise guidelines for adults. Provided in patient instructions, community resources and ideas for safe exercise at home to assist in meeting exercise guideline recommendations in a safe and healthy way.  -Advise yearly dental visits at minimum and regular eye exams - he sees the dentist today -Advised and counseled on alcohol safe limits, risks Follow up: see patient instructions   Patient Instructions  I really enjoyed getting to talk with you today! I am available on Tuesdays and Thursdays for virtual visits if you have any questions or concerns, or if I can be of any further assistance.   CHECKLIST FROM ANNUAL WELLNESS VISIT:  -Follow up (please call to schedule if not scheduled after visit):   -yearly for annual wellness visit with primary care office  Here is a list of your preventive care/health maintenance measures and the plan for each if any are due:  PLAN For any measures below that may  be due:  -can get covid booster at the pharmacy  Health Maintenance  Topic Date Due   COVID-19 Vaccine (9 - 2023-24 season) 06/30/2022   INFLUENZA VACCINE  03/05/2023   Medicare Annual Wellness (AWV)  11/18/2023   DTaP/Tdap/Td (3 - Td or Tdap) 05/08/2025   COLONOSCOPY (Pts 45-67yrs Insurance coverage will need to be confirmed)  11/19/2025   Pneumonia Vaccine 85+ Years old  Completed   Hepatitis C Screening  Completed   Zoster Vaccines- Shingrix  Completed   HPV VACCINES  Aged Out    -See a dentist at least yearly  -Get your eyes checked and then per your eye specialist's recommendations  -Other issues addressed today:   -I have included below further information regarding a healthy whole foods based diet, physical activity guidelines for adults, stress management and opportunities for social connections. I hope you find this information useful.   -----------------------------------------------------------------------------------------------------------------------------------------------------------------------------------------------------------------------------------------------------------  NUTRITION: -eat real food: lots of colorful vegetables (half the plate) and fruits -5-7 servings of vegetables and fruits per day (fresh or steamed is best), exp. 2 servings of vegetables with lunch and dinner and 2 servings  of fruit per day. Berries and greens such as kale and collards are great choices.  -consume on a regular basis: whole grains (make sure first ingredient on label contains the word "whole"), fresh fruits, fish, nuts, seeds, healthy oils (such as olive oil, avocado oil, grape seed oil) -may eat small amounts of dairy and lean meat on occasion, but avoid processed meats such as ham, bacon, lunch meat, etc. -drink water -try to avoid fast food and pre-packaged foods, processed meat -most experts advise limiting sodium to < 2300mg  per day, should limit further is any chronic  conditions such as high blood pressure, heart disease, diabetes, etc. The American Heart Association advised that < 1500mg  is is ideal -try to avoid foods that contain any ingredients with names you do not recognize  -try to avoid sugar/sweets (except for the natural sugar that occurs in fresh fruit) -try to avoid sweet drinks -try to avoid white rice, white bread, pasta (unless whole grain), white or yellow potatoes  EXERCISE GUIDELINES FOR ADULTS: -if you wish to increase your physical activity, do so gradually and with the approval of your doctor -STOP and seek medical care immediately if you have any chest pain, chest discomfort or trouble breathing when starting or increasing exercise  -move and stretch your body, legs, feet and arms when sitting for long periods -Physical activity guidelines for optimal health in adults: -least 150 minutes per week of aerobic exercise (can talk, but not sing) once approved by your doctor, 20-30 minutes of sustained activity or two 10 minute episodes of sustained activity every day.  -resistance training at least 2 days per week if approved by your doctor -balance exercises 3+ days per week:   Stand somewhere where you have something sturdy to hold onto if you lose balance.    1) lift up on toes, start with 5x per day and work up to 20x   2) stand and lift on leg straight out to the side so that foot is a few inches of the floor, start with 5x each side and work up to 20x each side   3) stand on one foot, start with 5 seconds each side and work up to 20 seconds on each side  If you need ideas or help with getting more active:  -Silver sneakers https://tools.silversneakers.com  -Walk with a Doc: http://www.duncan-williams.com/  -try to include resistance (weight lifting/strength building) and balance exercises twice per week: or the following link for  ideas: http://castillo-powell.com/  BuyDucts.dk  STRESS MANAGEMENT: -can try meditating, or just sitting quietly with deep breathing while intentionally relaxing all parts of your body for 5 minutes daily -if you need further help with stress, anxiety or depression please follow up with your primary doctor or contact the wonderful folks at [facility]: [phone]  SOCIAL CONNECTIONS: -options in [place] if you wish to engage in more social and exercise related activities:  -Silver sneakers https://tools.silversneakers.com  -Walk with a Doc: http://www.duncan-williams.com/  -Check out the [facility] Active Adults 50+ section on the [place] of [employer] (hiking clubs, book clubs, cards and games, chess, exercise classes, aquatic classes and much more) - see the website for details: https://www.[place].gov/departments/parks-recreation/active-adults50  -YouTube has lots of exercise videos for different ages and abilities as well  -[facility] (a variety of indoor and outdoor inperson activities for adults). 6061691402. [address]  -Virtual Online Classes (a variety of topics): see seniorplanet.org or call [phone]  -consider volunteering at a school, hospice center, church, senior  center or elsewhere           Terressa Koyanagi, DO

## 2022-11-24 DIAGNOSIS — H0102A Squamous blepharitis right eye, upper and lower eyelids: Secondary | ICD-10-CM | POA: Diagnosis not present

## 2022-11-24 DIAGNOSIS — H2513 Age-related nuclear cataract, bilateral: Secondary | ICD-10-CM | POA: Diagnosis not present

## 2022-11-27 ENCOUNTER — Ambulatory Visit (INDEPENDENT_AMBULATORY_CARE_PROVIDER_SITE_OTHER): Payer: Medicare Other | Admitting: Family Medicine

## 2022-11-27 ENCOUNTER — Encounter: Payer: Self-pay | Admitting: Family Medicine

## 2022-11-27 VITALS — BP 100/68 | HR 67 | Temp 97.5°F | Ht 74.0 in | Wt 184.6 lb

## 2022-11-27 DIAGNOSIS — E782 Mixed hyperlipidemia: Secondary | ICD-10-CM

## 2022-11-27 DIAGNOSIS — N529 Male erectile dysfunction, unspecified: Secondary | ICD-10-CM

## 2022-11-27 DIAGNOSIS — R351 Nocturia: Secondary | ICD-10-CM

## 2022-11-27 DIAGNOSIS — N401 Enlarged prostate with lower urinary tract symptoms: Secondary | ICD-10-CM | POA: Diagnosis not present

## 2022-11-27 DIAGNOSIS — I10 Essential (primary) hypertension: Secondary | ICD-10-CM | POA: Diagnosis not present

## 2022-11-27 DIAGNOSIS — Z0000 Encounter for general adult medical examination without abnormal findings: Secondary | ICD-10-CM

## 2022-11-27 LAB — PSA: PSA: 1.49 ng/mL (ref 0.10–4.00)

## 2022-11-27 LAB — LIPID PANEL
Cholesterol: 155 mg/dL (ref 0–200)
HDL: 47.6 mg/dL (ref >39.00)
LDL Cholesterol: 90 mg/dL (ref 0–99)
NonHDL: 107.24
Total CHOL/HDL Ratio: 3
Triglycerides: 85 mg/dL (ref 0.0–149.0)
VLDL: 17 mg/dL (ref 0.0–40.0)

## 2022-11-27 LAB — COMPREHENSIVE METABOLIC PANEL
ALT: 21 U/L (ref 0–53)
AST: 22 U/L (ref 0–37)
Albumin: 4.1 g/dL (ref 3.5–5.2)
Alkaline Phosphatase: 52 U/L (ref 39–117)
BUN: 13 mg/dL (ref 6–23)
CO2: 31 mEq/L (ref 19–32)
Calcium: 9.2 mg/dL (ref 8.4–10.5)
Chloride: 101 mEq/L (ref 96–112)
Creatinine, Ser: 0.97 mg/dL (ref 0.40–1.50)
GFR: 75.43 mL/min (ref >60.00)
Glucose, Bld: 90 mg/dL (ref 70–99)
Potassium: 4.4 mEq/L (ref 3.5–5.1)
Sodium: 139 mEq/L (ref 135–145)
Total Bilirubin: 1 mg/dL (ref 0.2–1.2)
Total Protein: 6.3 g/dL (ref 6.0–8.3)

## 2022-11-27 LAB — TESTOSTERONE: Testosterone: 458.42 ng/dL (ref 300.00–890.00)

## 2022-11-27 MED ORDER — SILDENAFIL CITRATE 100 MG PO TABS
50.0000 mg | ORAL_TABLET | Freq: Every day | ORAL | 11 refills | Status: DC | PRN
Start: 2022-11-27 — End: 2023-12-31

## 2022-11-27 NOTE — Patient Instructions (Signed)
Health Maintenance, Male Adopting a healthy lifestyle and getting preventive care are important in promoting health and wellness. Ask your health care provider about: The right schedule for you to have regular tests and exams. Things you can do on your own to prevent diseases and keep yourself healthy. What should I know about diet, weight, and exercise? Eat a healthy diet  Eat a diet that includes plenty of vegetables, fruits, low-fat dairy products, and lean protein. Do not eat a lot of foods that are high in solid fats, added sugars, or sodium. Maintain a healthy weight Body mass index (BMI) is a measurement that can be used to identify possible weight problems. It estimates body fat based on height and weight. Your health care provider can help determine your BMI and help you achieve or maintain a healthy weight. Get regular exercise Get regular exercise. This is one of the most important things you can do for your health. Most adults should: Exercise for at least 150 minutes each week. The exercise should increase your heart rate and make you sweat (moderate-intensity exercise). Do strengthening exercises at least twice a week. This is in addition to the moderate-intensity exercise. Spend less time sitting. Even light physical activity can be beneficial. Watch cholesterol and blood lipids Have your blood tested for lipids and cholesterol at 77 years of age, then have this test every 5 years. You may need to have your cholesterol levels checked more often if: Your lipid or cholesterol levels are high. You are older than 77 years of age. You are at high risk for heart disease. What should I know about cancer screening? Many types of cancers can be detected early and may often be prevented. Depending on your health history and family history, you may need to have cancer screening at various ages. This may include screening for: Colorectal cancer. Prostate cancer. Skin cancer. Lung  cancer. What should I know about heart disease, diabetes, and high blood pressure? Blood pressure and heart disease High blood pressure causes heart disease and increases the risk of stroke. This is more likely to develop in people who have high blood pressure readings or are overweight. Talk with your health care provider about your target blood pressure readings. Have your blood pressure checked: Every 3-5 years if you are 18-39 years of age. Every year if you are 40 years old or older. If you are between the ages of 65 and 75 and are a current or former smoker, ask your health care provider if you should have a one-time screening for abdominal aortic aneurysm (AAA). Diabetes Have regular diabetes screenings. This checks your fasting blood sugar level. Have the screening done: Once every three years after age 45 if you are at a normal weight and have a low risk for diabetes. More often and at a younger age if you are overweight or have a high risk for diabetes. What should I know about preventing infection? Hepatitis B If you have a higher risk for hepatitis B, you should be screened for this virus. Talk with your health care provider to find out if you are at risk for hepatitis B infection. Hepatitis C Blood testing is recommended for: Everyone born from 1945 through 1965. Anyone with known risk factors for hepatitis C. Sexually transmitted infections (STIs) You should be screened each year for STIs, including gonorrhea and chlamydia, if: You are sexually active and are younger than 77 years of age. You are older than 77 years of age and your   health care provider tells you that you are at risk for this type of infection. Your sexual activity has changed since you were last screened, and you are at increased risk for chlamydia or gonorrhea. Ask your health care provider if you are at risk. Ask your health care provider about whether you are at high risk for HIV. Your health care provider  may recommend a prescription medicine to help prevent HIV infection. If you choose to take medicine to prevent HIV, you should first get tested for HIV. You should then be tested every 3 months for as long as you are taking the medicine. Follow these instructions at home: Alcohol use Do not drink alcohol if your health care provider tells you not to drink. If you drink alcohol: Limit how much you have to 0-2 drinks a day. Know how much alcohol is in your drink. In the [place] one drink equals one 12 oz bottle of beer (355 mL), one 5 oz glass of wine (148 mL), or one 1 oz glass of hard liquor (44 mL). Lifestyle Do not use any products that contain nicotine or tobacco. These products include cigarettes, chewing tobacco, and vaping devices, such as e-cigarettes. If you need help quitting, ask your health care provider. Do not use street drugs. Do not share needles. Ask your health care provider for help if you need support or information about quitting drugs. General instructions Schedule regular health, dental, and eye exams. Stay current with your vaccines. Tell your health care provider if: You often feel depressed. You have ever been abused or do not feel safe at home. Summary Adopting a healthy lifestyle and getting preventive care are important in promoting health and wellness. Follow your health care provider's instructions about healthy diet, exercising, and getting tested or screened for diseases. Follow your health care provider's instructions on monitoring your cholesterol and blood pressure. This information is not intended to replace advice given to you by your health care provider. Make sure you discuss any questions you have with your health care provider. Document Revised: 12/10/2020 Document Reviewed: 12/10/2020 Elsevier Patient Education  2023 Elsevier Inc.  

## 2022-11-27 NOTE — Progress Notes (Signed)
Complete physical exam  Patient: Garrett Vargas   DOB: 1946-05-30   77 y.o. Male  MRN: [id number]  Subjective:    Chief Complaint  Patient presents with   Annual Exam    Garrett Vargas is a 77 y.o. male who presents today for a complete physical exam. He reports consuming a general diet. Gym/ health club routine includes light weights and stationary bike. He generally feels well. He reports sleeping fairly well. He does not have additional problems to discuss today.    Most recent fall risk assessment:    11/27/2022    9:27 AM  Fall Risk   Falls in the past year? 0  Number falls in past yr: 0  Injury with Fall? 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 07/17/2019   Basal cell carcinoma (BCC) of neck 07/17/2019   Syncope and collapse 04/12/2018   BPH associated with nocturia 04/06/2014   HEMATURIA, HX OF 01/04/2009   ESSENTIAL HYPERTENSION, BENIGN 12/04/2008   Hyperlipemia 01/03/2008   ERECTILE DYSFUNCTION, MILD 01/03/2008   Allergic rhinitis 01/03/2008   Paroxysmal atrial fibrillation 04/20/2007      Patient Care Team: Karie Georges, MD as PCP - General (Family Medicine) Wynona Canes, MD as Referring Physician (Dermatology) Verner Chol, [facility] (Inactive) as Pharmacist (Pharmacist)   Outpatient Medications Prior to Visit  Medication Sig   atenolol (TENORMIN) 25 MG tablet TAKE ONE-HALF TABLET BY MOUTH  DAILY . MAY TAKE AN EXTRA  ONE-HALF TABLET BY MOUTH FOR  PALPITATIONS OR HEART RACING   atorvastatin (LIPITOR) 40 MG tablet Take 1  tablet (40 mg total) by mouth daily.   flecainide (TAMBOCOR) 50 MG tablet TAKE 1 TABLET BY MOUTH TWICE  DAILY   ipratropium (ATROVENT) 0.03 % nasal spray Place 1 spray into both nostrils as needed.   Multiple Vitamins-Minerals (CENTRUM SILVER PO) Take 1 capsule by mouth daily.   Omega-3 Fatty Acids (FISH OIL PO) Take 1,040 mg by mouth daily.   omeprazole (PRILOSEC) 20 MG capsule Take 20 mg by mouth daily.   rivaroxaban (XARELTO) 20 MG TABS tablet TAKE 1 TABLET BY MOUTH DAILY  WITH SUPPER   [DISCONTINUED] tadalafil (CIALIS) 5 MG tablet Take 5 mg by mouth daily as needed for erectile dysfunction.   No facility-administered medications prior to visit.    Review of Systems  HENT:  Negative for hearing loss.   Eyes:  Negative for blurred vision.  Respiratory:  Negative for shortness of breath.   Cardiovascular:  Negative for chest pain.  Gastrointestinal: Negative.   Genitourinary: Negative.   Musculoskeletal:  Negative for back pain.  Neurological:  Negative for headaches.  Psychiatric/Behavioral:  Negative for depression.   All other systems reviewed and are negative.         Objective:     BP 100/68 (BP Location: Right Arm, Patient Position: Sitting, Cuff Size: Normal)   Pulse 67   Temp (!) 97.5 F (36.4 C) (  Oral)   Ht  (1.88 m)   Wt 184 lb 9.6 oz (83.7 kg)   SpO2 97%   BMI 23.70 kg/m    Physical Exam Vitals reviewed.  Constitutional:      Appearance: Normal appearance. He is well-groomed and normal weight.  HENT:     Right Ear: Tympanic membrane and ear canal normal.     Left Ear: Tympanic membrane and ear canal normal.     Mouth/Throat:     Mouth: Mucous membranes are moist.     Pharynx: No posterior oropharyngeal erythema.  Eyes:     Extraocular Movements: Extraocular movements intact.     Conjunctiva/sclera: Conjunctivae normal.  Neck:     Thyroid: No thyromegaly.  Cardiovascular:     Rate and Rhythm: Normal rate and regular rhythm.     Heart sounds:  S1 normal and S2 normal. No murmur heard. Pulmonary:     Effort: Pulmonary effort is normal.     Breath sounds: Normal breath sounds and air entry. No rales.  Abdominal:     General: Abdomen is flat. Bowel sounds are normal.  Musculoskeletal:     Right lower leg: No edema.     Left lower leg: No edema.  Lymphadenopathy:     Cervical: No cervical adenopathy.  Neurological:     General: No focal deficit present.     Mental Status: He is alert and oriented to person, place, and time.     Gait: Gait is intact.  Psychiatric:        Mood and Affect: Mood and affect normal.      No results found for any visits on 11/27/22.     Assessment & Plan:    Routine Health Maintenance and Physical Exam  Immunization History  Administered Date(s) Administered   Fluad Quad(high Dose 65+) 04/19/2021, 04/10/2022   Hepatitis A 07/02/2012, 01/10/2013   Influenza Nasal 05/04/2017   Influenza Split 04/27/2012   Influenza Whole 05/04/2009   Influenza, High Dose Seasonal PF 06/04/2018   Influenza,inj,Quad PF,6+ Mos 05/05/2019   Influenza-Unspecified 05/04/2014, 05/07/2015, 04/28/2016, 05/04/2020   Moderna Covid-19 Vaccine Bivalent Booster 9yrs & up 05/05/2022   Moderna Sars-Covid-2 Vaccination 05/05/2022   PFIZER(Purple Top)SARS-COV-2 Vaccination 08/17/2019, 09/05/2019, 04/12/2020, 11/13/2020   PPD Test 04/16/2011, 04/27/2012, 04/27/2013, 04/11/2014   Pfizer Covid-19 Vaccine Bivalent Booster 57yrs & up 08/17/2019, 05/06/2021, 01/23/2022   Pfizer Covid-19 Vaccine Bivalent Booster 5y-11y 09/05/2019   Pneumococcal Conjugate-13 04/06/2014   Pneumococcal Polysaccharide-23 12/10/2010   RSV,unspecified 05/01/2022   Respiratory Syncytial Virus Vaccine,Recomb Aduvanted(Arexvy) 05/01/2022   Td 01/08/2005   Tdap 05/09/2015   Zoster Recombinat (Shingrix) 09/02/2018, 02/23/2019   Zoster, Live 08/13/2009    Health Maintenance  Topic Date Due   COVID-19 Vaccine (10 - 2023-24 season) 06/30/2022    INFLUENZA VACCINE  03/05/2023   Medicare Annual Wellness (AWV)  11/18/2023   DTaP/Tdap/Td (3 - Td or Tdap) 05/08/2025   COLONOSCOPY (Pts 45-66yrs Insurance coverage will need to be confirmed)  11/19/2025   Pneumonia Vaccine 18+ Years old  Completed   Hepatitis C Screening  Completed   Zoster Vaccines- Shingrix  Completed   HPV VACCINES  Aged Out    Discussed health benefits of physical activity, and encouraged him to engage in regular exercise appropriate for his age and condition.  Problem List Items Addressed This Visit       Unprioritized   Hyperlipemia - Primary   Relevant Medications   sildenafil (VIAGRA) 100 MG tablet   Other Relevant  Orders   Lipid Panel   ESSENTIAL HYPERTENSION, BENIGN   Relevant Medications   sildenafil (VIAGRA) 100 MG tablet   Other Relevant Orders   CMP   BPH associated with nocturia   Relevant Orders   PSA   Other Visit Diagnoses     Erectile dysfunction, unspecified erectile dysfunction type       Relevant Medications   sildenafil (VIAGRA) 100 MG tablet   Other Relevant Orders   Testosterone   Routine general medical examination at a health care facility      Normal Physical exam findings today, he is UTD on his health maintenance. I have placed orders for annual labs today, pt reports a family history of prostate issues and would like a PSA done this year. We discussed healthy sleep patterns, exercise, handouts given.     Return in 6 months (on 05/29/2023).     Karie Georges, MD

## 2022-11-28 ENCOUNTER — Telehealth: Payer: Self-pay | Admitting: Family Medicine

## 2022-11-28 NOTE — Telephone Encounter (Signed)
Patient states a PA is needed for sildenafil (VIAGRA) 100 MG tablet. PA provided [phone]

## 2022-12-01 NOTE — Telephone Encounter (Signed)
Prior auth for Sildenafil 100mg  sent to [email or web address]-Key: [id number] pending review by insurance.

## 2022-12-09 DIAGNOSIS — M67911 Unspecified disorder of synovium and tendon, right shoulder: Secondary | ICD-10-CM | POA: Diagnosis not present

## 2022-12-09 DIAGNOSIS — M7662 Achilles tendinitis, left leg: Secondary | ICD-10-CM | POA: Diagnosis not present

## 2022-12-18 ENCOUNTER — Other Ambulatory Visit: Payer: Self-pay | Admitting: Physician Assistant

## 2023-01-18 ENCOUNTER — Other Ambulatory Visit: Payer: Self-pay | Admitting: Family Medicine

## 2023-01-23 DIAGNOSIS — M79661 Pain in right lower leg: Secondary | ICD-10-CM | POA: Diagnosis not present

## 2023-01-23 DIAGNOSIS — M25571 Pain in right ankle and joints of right foot: Secondary | ICD-10-CM | POA: Diagnosis not present

## 2023-03-19 ENCOUNTER — Other Ambulatory Visit: Payer: Self-pay | Admitting: Physician Assistant

## 2023-03-25 DIAGNOSIS — Z85828 Personal history of other malignant neoplasm of skin: Secondary | ICD-10-CM | POA: Diagnosis not present

## 2023-03-25 DIAGNOSIS — L821 Other seborrheic keratosis: Secondary | ICD-10-CM | POA: Diagnosis not present

## 2023-03-25 DIAGNOSIS — L308 Other specified dermatitis: Secondary | ICD-10-CM | POA: Diagnosis not present

## 2023-03-25 DIAGNOSIS — L814 Other melanin hyperpigmentation: Secondary | ICD-10-CM | POA: Diagnosis not present

## 2023-03-25 DIAGNOSIS — Z08 Encounter for follow-up examination after completed treatment for malignant neoplasm: Secondary | ICD-10-CM | POA: Diagnosis not present

## 2023-03-25 DIAGNOSIS — D225 Melanocytic nevi of trunk: Secondary | ICD-10-CM | POA: Diagnosis not present

## 2023-04-01 ENCOUNTER — Encounter: Payer: Self-pay | Admitting: Internal Medicine

## 2023-04-01 ENCOUNTER — Ambulatory Visit: Payer: Medicare Other | Attending: Internal Medicine | Admitting: Internal Medicine

## 2023-04-01 VITALS — BP 114/74 | HR 70 | Ht 74.0 in | Wt 181.2 lb

## 2023-04-01 DIAGNOSIS — I480 Paroxysmal atrial fibrillation: Secondary | ICD-10-CM

## 2023-04-01 NOTE — Patient Instructions (Signed)

## 2023-04-01 NOTE — Progress Notes (Signed)
HPI Dr. 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 He is a pleasant 77 yo man with a h/o PAF, tachy induced CM that resolved, remote tobacco abuse and syncope, s/p ILR insertion. Several months ago his atrial fib/flutter was increasing in frequency and he was started on low dose flecainide. He has done well in the interim. He has not had palpitations or syncope. No chest pain or sob. No edema. Despite a h/o syncope he has not had any arrhythmias on his ILR and we quit following over a year ago. No Known Allergies   Current Outpatient Medications  Medication Sig Dispense Refill   atenolol (TENORMIN) 25 MG tablet TAKE ONE-HALF TABLET BY MOUTH  DAILY . MAY TAKE AN EXTRA  ONE-HALF TABLET BY MOUTH FOR  PALPITATIONS OR HEART RACING. Please call office to schedule an appt for further refills. Thank you 30 tablet 0   atorvastatin (LIPITOR) 40 MG tablet TAKE 1 TABLET BY MOUTH DAILY 90 tablet 1   flecainide (TAMBOCOR) 50 MG tablet Take 1 tablet (50 mg total) by mouth 2 (two) times daily. Please call office to schedule an appt for further refills. Thank you 60 tablet 0   ipratropium (ATROVENT) 0.03 % nasal spray Place 1 spray into both nostrils as needed.     Multiple Vitamins-Minerals (CENTRUM SILVER PO) Take 1 capsule by mouth daily.     Omega-3 Fatty Acids (FISH OIL PO) Take 1,040 mg by mouth daily.     omeprazole (PRILOSEC) 20 MG capsule Take 20 mg by mouth daily.     rivaroxaban (XARELTO) 20 MG TABS tablet TAKE 1 TABLET BY MOUTH DAILY  WITH SUPPER 100 tablet 1   sildenafil (VIAGRA) 100 MG tablet Take 0.5-1 tablets (50-100 mg total) by mouth daily as needed for erectile dysfunction. 10 tablet 11   No current facility-administered medications for this visit.     Past Medical History:  Diagnosis Date   A-fib (HCC)    ALLERGIC RHINITIS    Arthritis    Colon polyp    Dysrhythmia    Erectile dysfunction    GERD (gastroesophageal reflux disease)    History of bronchitis as a child     Hyperlipemia    Hypertension    S/P lateral meniscectomy of left knee     ROS:   All systems reviewed and negative except as noted in the HPI.   Past Surgical History:  Procedure Laterality Date   CARDIOVERSION N/A 03/17/2014   Procedure: CARDIOVERSION;  Surgeon: Thurmon Fair, MD;  Location: [facility];  Service: Cardiovascular;  Laterality: N/A;   COLONOSCOPY WITH PROPOFOL N/A 11/19/2020   Procedure: COLONOSCOPY WITH PROPOFOL;  Surgeon: Lemar Lofty., MD;  Location: [facility];  Service: Gastroenterology;  Laterality: N/A;   colonscopy      ELBOW ARTHROSCOPY Bilateral    ENDOSCOPIC MUCOSAL RESECTION N/A 11/19/2020   Procedure: ENDOSCOPIC MUCOSAL RESECTION;  Surgeon: Meridee Score Netty Starring., MD;  Location: [facility];  Service: Gastroenterology;  Laterality: N/A;   HEMOSTASIS CLIP PLACEMENT  11/19/2020   Procedure: HEMOSTASIS CLIP PLACEMENT;  Surgeon: Lemar Lofty., MD;  Location: [facility];  Service: Gastroenterology;;   LOOP RECORDER INSERTION N/A 04/12/2018   Procedure: LOOP RECORDER INSERTION;  Surgeon: Marinus Maw, MD;  Location: [facility];  Service: Cardiovascular;  Laterality: N/A;   Multiple Knee,elbow and foot surgeries     neuroma left foot,2010, Dr. Vida Roller     POLYPECTOMY  11/19/2020   Procedure: POLYPECTOMY;  Surgeon: Corliss Parish  Montez Hageman., MD;  Location: [facility];  Service: Gastroenterology;;   Sunnie Nielsen LIFTING INJECTION  11/19/2020   Procedure: SUBMUCOSAL LIFTING INJECTION;  Surgeon: Lemar Lofty., MD;  Location: [facility];  Service: Gastroenterology;;   TEE WITHOUT CARDIOVERSION N/A 03/17/2014   Procedure: TRANSESOPHAGEAL ECHOCARDIOGRAM (TEE);  Surgeon: Thurmon Fair, MD;  Location: [facility];  Service: Cardiovascular;  Laterality: N/A;   TOTAL HIP ARTHROPLASTY Left 07/18/2015   Procedure: LEFT TOTAL HIP ARTHROPLASTY ANTERIOR APPROACH;  Surgeon: Ollen Gross, MD;  Location: [facility];  Service:  Orthopedics;  Laterality: Left;   TRIGGER FINGER RELEASE Right 08/30/2021   Procedure: RELEASE TRIGGER FINGER/A-1 PULLEY RIGHT RING FINGER;  Surgeon: Cindee Salt, MD;  Location: [facility];  Service: Orthopedics;  Laterality: Right;  45 MIN     Family History  Problem Relation Age of Onset   Coronary artery disease Other    Prostate cancer Other    Stroke Other    Heart disease Mother    Stroke Mother 32   Prostate cancer Father 39   Kidney disease Sister      Social History   Socioeconomic History   Marital status: Married    Spouse name: Not on file   Number of children: 3   Years of education: Not on file   Highest education level: Professional school degree (e.g., MD, DDS, DVM, JD)  Occupational History   Occupation: retired Transport planner   Occupation: former smoker   Occupation: Engineer, structural use-yes   Occupation: Drug use-no   Occupation: regular exercise-yes  Tobacco Use   Smoking status: Former    Current packs/day: 0.00    Average packs/day: 0.5 packs/day for 10.0 years (5.0 ttl pk-yrs)    Types: Cigarettes    Start date: 08/04/1977    Quit date: 08/05/1987    Years since quitting: 35.6   Smokeless tobacco: Never  Vaping Use   Vaping status: Never Used  Substance and Sexual Activity   Alcohol use: Yes    Alcohol/week: 0.0 standard drinks of alcohol    Comment: occasional   Drug use: No   Sexual activity: Not on file  Other Topics Concern   Not on file  Social History Narrative   Not on file   Social Determinants of Health   Financial Resource Strain: Low Risk  (11/14/2021)   Overall Financial Resource Strain (CARDIA)    Difficulty of Paying Living Expenses: Not hard at all  Food Insecurity: No Food Insecurity (11/14/2021)   Hunger Vital Sign    Worried About Running Out of Food in the Last Year: Never true    Ran Out of Food in the Last Year: Never true  Transportation Needs: No Transportation Needs (05/20/2022)   PRAPARE - Therapist, art (Medical): No    Lack of Transportation (Non-Medical): No  Physical Activity: Insufficiently Active (11/14/2021)   Exercise Vital Sign    Days of Exercise per Week: 3 days    Minutes of Exercise per Session: 30 min  Stress: No Stress Concern Present (11/14/2021)   [employer] of Occupational Health - Occupational Stress Questionnaire    Feeling of Stress : Only a little  Social Connections: Socially Integrated (11/14/2021)   Social Connection and Isolation Panel [NHANES]    Frequency of Communication with Friends and Family: More than three times a week    Frequency of Social Gatherings with Friends and Family: More than three times a week    Attends Religious Services: More than  4 times per year    Active Member of Clubs or Organizations: Yes    Attends Banker Meetings: More than 4 times per year    Marital Status: Married  Catering manager Violence: Not At Risk (11/14/2021)   Humiliation, Afraid, Rape, and Kick questionnaire    Fear of Current or Ex-Partner: No    Emotionally Abused: No    Physically Abused: No    Sexually Abused: No     BP 114/74   Pulse 70   Ht 6\' 2"  (1.88 m)   Wt 181 lb 3.2 oz (82.2 kg)   SpO2 97%   BMI 23.26 kg/m   Physical Exam:  Well appearing NAD HEENT: Unremarkable Neck:  No JVD, no thyromegally Lymphatics:  No adenopathy Back:  No CVA tenderness Lungs:  Clear HEART:  Regular rate rhythm, no murmurs, no rubs, no clicks; split S2.  Abd:  soft, positive bowel sounds, no organomegally, no rebound, no guarding Ext:  2 plus pulses, no edema, no cyanosis, no clubbing Skin:  No rashes no nodules Neuro:  CN II through XII intact, motor grossly intact  EKG - nsr with RBBB  Assess/Plan:  1. PAF - he is maintaining NSR. He will continue low dose flecainide. No QRS prolongation.  2. Syncope - he has had no recurrent episodes and his ILR demonstrates no prolonged pauses. 3. HTN - his bp is well controlled. We  will follow. Continue atenolol 25 mg daily 4. 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 No change in his meds.   Sharlot Gowda Lanyiah Brix,MD

## 2023-05-03 ENCOUNTER — Other Ambulatory Visit: Payer: Self-pay | Admitting: Family Medicine

## 2023-05-03 ENCOUNTER — Other Ambulatory Visit: Payer: Self-pay | Admitting: Internal Medicine

## 2023-05-03 DIAGNOSIS — I480 Paroxysmal atrial fibrillation: Secondary | ICD-10-CM

## 2023-05-04 NOTE — Telephone Encounter (Signed)
Prescription refill request for Xarelto received.  Indication:afib Last office visit:8/24 Weight:82.2  kg Age:77 Scr:0.97  4/24 CrCl:74.15  ml/min  Prescription refilled

## 2023-05-12 DIAGNOSIS — M65332 Trigger finger, left middle finger: Secondary | ICD-10-CM | POA: Diagnosis not present

## 2023-05-19 ENCOUNTER — Other Ambulatory Visit: Payer: Self-pay | Admitting: Internal Medicine

## 2023-06-03 ENCOUNTER — Emergency Department (HOSPITAL_COMMUNITY)
Admission: EM | Admit: 2023-06-03 | Discharge: 2023-06-04 | Disposition: A | Discharge status: Home / Self Care | Payer: Medicare Other | Attending: Emergency Medicine | Admitting: Emergency Medicine

## 2023-06-03 ENCOUNTER — Encounter (HOSPITAL_COMMUNITY): Payer: Self-pay

## 2023-06-03 DIAGNOSIS — M7989 Other specified soft tissue disorders: Secondary | ICD-10-CM

## 2023-06-03 DIAGNOSIS — I2510 Atherosclerotic heart disease of native coronary artery without angina pectoris: Secondary | ICD-10-CM | POA: Diagnosis not present

## 2023-06-03 DIAGNOSIS — Z86718 Personal history of other venous thrombosis and embolism: Secondary | ICD-10-CM | POA: Insufficient documentation

## 2023-06-03 DIAGNOSIS — L03116 Cellulitis of left lower limb: Secondary | ICD-10-CM | POA: Diagnosis not present

## 2023-06-03 DIAGNOSIS — Z7901 Long term (current) use of anticoagulants: Secondary | ICD-10-CM | POA: Diagnosis not present

## 2023-06-03 DIAGNOSIS — R590 Localized enlarged lymph nodes: Secondary | ICD-10-CM | POA: Diagnosis not present

## 2023-06-03 DIAGNOSIS — R55 Syncope and collapse: Secondary | ICD-10-CM | POA: Diagnosis not present

## 2023-06-03 DIAGNOSIS — I10 Essential (primary) hypertension: Secondary | ICD-10-CM | POA: Diagnosis not present

## 2023-06-03 DIAGNOSIS — Z79899 Other long term (current) drug therapy: Secondary | ICD-10-CM | POA: Insufficient documentation

## 2023-06-03 DIAGNOSIS — I700 Atherosclerosis of aorta: Secondary | ICD-10-CM | POA: Diagnosis not present

## 2023-06-03 DIAGNOSIS — M79605 Pain in left leg: Secondary | ICD-10-CM | POA: Diagnosis present

## 2023-06-03 NOTE — ED Triage Notes (Signed)
Pt is coming in with lower left leg pain that has been going on for around a week after he flew to [place] for a hunting trip. He mentioned it being a small discomfort for arounf a week until yesterday where the pain had started to worsen and now today the calf feels like it is getting swollen and hard. Pt is otherwise stable with no other complaints at this time and he has already applied an compression wrap on the leg. He is on Xarelto as well.

## 2023-06-04 ENCOUNTER — Emergency Department (HOSPITAL_COMMUNITY): Payer: Medicare Other

## 2023-06-04 ENCOUNTER — Other Ambulatory Visit: Payer: Self-pay

## 2023-06-04 DIAGNOSIS — M79662 Pain in left lower leg: Secondary | ICD-10-CM

## 2023-06-04 DIAGNOSIS — R590 Localized enlarged lymph nodes: Secondary | ICD-10-CM | POA: Diagnosis not present

## 2023-06-04 DIAGNOSIS — I2510 Atherosclerotic heart disease of native coronary artery without angina pectoris: Secondary | ICD-10-CM | POA: Diagnosis not present

## 2023-06-04 DIAGNOSIS — R55 Syncope and collapse: Secondary | ICD-10-CM | POA: Diagnosis not present

## 2023-06-04 DIAGNOSIS — I700 Atherosclerosis of aorta: Secondary | ICD-10-CM | POA: Diagnosis not present

## 2023-06-04 LAB — CBC WITH DIFFERENTIAL/PLATELET
Abs Immature Granulocytes: 0.03 10*3/uL (ref 0.00–0.07)
Basophils Absolute: 0 10*3/uL (ref 0.0–0.1)
Basophils Relative: 0 %
Eosinophils Absolute: 0.2 10*3/uL (ref 0.0–0.5)
Eosinophils Relative: 3 %
HCT: 42.2 % (ref 39.0–52.0)
Hemoglobin: 14 g/dL (ref 13.0–17.0)
Immature Granulocytes: 0 %
Lymphocytes Relative: 24 %
Lymphs Abs: 1.9 10*3/uL (ref 0.7–4.0)
MCH: 31.3 pg (ref 26.0–34.0)
MCHC: 33.2 g/dL (ref 30.0–36.0)
MCV: 94.2 fL (ref 80.0–100.0)
Monocytes Absolute: 0.7 10*3/uL (ref 0.1–1.0)
Monocytes Relative: 8 %
Neutro Abs: 5 10*3/uL (ref 1.7–7.7)
Neutrophils Relative %: 65 %
Platelets: 202 10*3/uL (ref 150–400)
RBC: 4.48 MIL/uL (ref 4.22–5.81)
RDW: 13.2 % (ref 11.5–15.5)
WBC: 7.8 10*3/uL (ref 4.0–10.5)
nRBC: 0 % (ref 0.0–0.2)

## 2023-06-04 LAB — I-STAT CHEM 8, ED
BUN: 15 mg/dL (ref 8–23)
Calcium, Ion: 1.18 mmol/L (ref 1.15–1.40)
Chloride: 99 mmol/L (ref 98–111)
Creatinine, Ser: 0.9 mg/dL (ref 0.61–1.24)
Glucose, Bld: 112 mg/dL — ABNORMAL HIGH (ref 70–99)
HCT: 42 % (ref 39.0–52.0)
Hemoglobin: 14.3 g/dL (ref 13.0–17.0)
Potassium: 4.1 mmol/L (ref 3.5–5.1)
Sodium: 140 mmol/L (ref 135–145)
TCO2: 30 mmol/L (ref 22–32)

## 2023-06-04 MED ORDER — IOHEXOL 350 MG/ML SOLN
60.0000 mL | Freq: Once | INTRAVENOUS | Status: AC | PRN
  Administered 2023-06-04: 60 mL via INTRAVENOUS

## 2023-06-04 MED ORDER — FLECAINIDE ACETATE 50 MG PO TABS
50.0000 mg | ORAL_TABLET | Freq: Two times a day (BID) | ORAL | Status: DC
  Administered 2023-06-04: 50 mg via ORAL
  Filled 2023-06-04: qty 1

## 2023-06-04 MED ORDER — RIVAROXABAN 10 MG PO TABS: 20.0000 mg | ORAL_TABLET | Freq: Every day | ORAL | Status: DC

## 2023-06-04 MED ORDER — CEPHALEXIN 250 MG PO CAPS
500.0000 mg | ORAL_CAPSULE | Freq: Once | ORAL | Status: AC
  Administered 2023-06-04: 500 mg via ORAL
  Filled 2023-06-04: qty 2

## 2023-06-04 MED ORDER — DOXYCYCLINE HYCLATE 100 MG PO CAPS: 100.0000 mg | ORAL_CAPSULE | Freq: Two times a day (BID) | ORAL | 0 refills | Status: DC

## 2023-06-04 MED ORDER — DOXYCYCLINE HYCLATE 100 MG PO TABS
100.0000 mg | ORAL_TABLET | Freq: Once | ORAL | Status: AC
  Administered 2023-06-04: 100 mg via ORAL
  Filled 2023-06-04: qty 1

## 2023-06-04 MED ORDER — CEPHALEXIN 500 MG PO CAPS: 500.0000 mg | ORAL_CAPSULE | Freq: Four times a day (QID) | ORAL | 0 refills | Status: DC

## 2023-06-04 NOTE — Discharge Instructions (Signed)
You are seen in the ER for leg swelling.  CT scan of your lungs was clear, no blood clots seen there. Ultrasound of your leg does not show any occlusive clots either.  Given that you have a warm, swollen, red appearing left lower extremity at this time we suspect that you might have cellulitis and are starting you on antibiotics.  As discussed, ultrasound has effectiveness about 80 -85% and ruling out a blood clot.  In some patients, repeat ultrasound is needed.  Therefore, we recommend that you call your doctor for a follow-up appointment next week.  If despite the antibiotics your swelling persist, then they should order a repeat ultrasound duplex of your leg.  You should return to the ER if you start having worsening swelling, worsening pain, fevers, nausea, vomiting.

## 2023-06-04 NOTE — Progress Notes (Signed)
Left lower extremity venous duplex completed. Results in Epic.   [facility], RVT.

## 2023-06-04 NOTE — ED Provider Notes (Signed)
  Physical Exam  BP 123/89   Pulse 76   Temp 97.6 F (36.4 C) (Oral)   Resp 19   SpO2 100%   Physical Exam  Procedures  Procedures  ED Course / MDM    Medical Decision Making Amount and/or Complexity of Data Reviewed Labs: ordered. Radiology: ordered.  Risk Prescription drug management.   Pt on xarelto w/ history of DVT comes in with cc of calf swelling. DVT study ordered.  If neg, can be discharged. If positive, will need admission.       Derwood Kaplan, MD 06/04/23 [phone]

## 2023-06-04 NOTE — ED Provider Notes (Signed)
[place] EMERGENCY DEPARTMENT AT [facility] Provider Note   CSN: [id number] Arrival date & time: 06/03/23  1749     History  Chief Complaint  Patient presents with   Leg Pain    Garrett Vargas is a 77 y.o. male.  The history is provided by the patient.  Leg Pain Location:  Leg Time since incident: days. Leg location:  L lower leg Pain details:    Quality:  Aching   Severity:  Moderate   Onset quality:  Gradual   Timing:  Constant   Progression:  Unchanged Chronicity:  New Prior injury to area:  No Relieved by:  Nothing Worsened by:  Nothing Ineffective treatments: Xarelto. Associated symptoms: swelling   Risk factors: no concern for non-accidental trauma   Patient on Xarelto presents with concern for LLE DVT [place]  No missed dosages of medication.  No CP no SOB.      Past Medical History:  Diagnosis Date   A-fib (HCC)    ALLERGIC RHINITIS    Arthritis    Colon polyp    Dysrhythmia    Erectile dysfunction    GERD (gastroesophageal reflux disease)    History of bronchitis as a child    Hyperlipemia    Hypertension    S/P lateral meniscectomy of left knee      Home Medications Prior to Admission medications   Medication Sig Start Date End Date Taking? Authorizing Provider  atenolol (TENORMIN) 25 MG tablet TAKE ONE-HALF TABLET BY MOUTH  DAILY . MAY TAKE AN EXTRA  ONE-HALF TABLET FOR PALPITATIONS OR HEART RACING. 05/19/23   Marinus Maw, MD  atorvastatin (LIPITOR) 40 MG tablet TAKE 1 TABLET BY MOUTH DAILY 06/02/23   Karie Georges, MD  flecainide [facility]) 50 MG tablet TAKE 1 TABLET BY MOUTH TWICE  DAILY 05/19/23   Marinus Maw, MD  ipratropium (ATROVENT) 0.03 % nasal spray Place 1 spray into both nostrils as needed. 10/15/20   [provider]  Multiple Vitamins-Minerals (CENTRUM SILVER PO) Take 1 capsule by mouth daily.    [provider]  Omega-3 Fatty Acids (FISH OIL PO) Take  1,040 mg by mouth daily.    [provider]  omeprazole (PRILOSEC) 20 MG capsule Take 20 mg by mouth daily.    [provider]  sildenafil (VIAGRA) 100 MG tablet Take 0.5-1 tablets (50-100 mg total) by mouth daily as needed for erectile dysfunction. 11/27/22   Karie Georges, MD  XARELTO 20 MG TABS tablet TAKE 1 TABLET BY MOUTH DAILY  WITH SUPPER 05/04/23   Marinus Maw, MD      Allergies    Patient has no known allergies.    Review of Systems   Review of Systems  Constitutional:  Negative for diaphoresis.  HENT:  Negative for facial swelling.   Respiratory:  Negative for shortness of breath, wheezing and stridor.   Cardiovascular:  Positive for leg swelling. Negative for chest pain.  All other systems reviewed and are negative.   Physical Exam Updated Vital Signs BP 123/89   Pulse 76   Temp (!) 97.5 F (36.4 C) (Oral)   Resp 19   SpO2 100%  Physical Exam Vitals and nursing note reviewed.  Constitutional:      General: He is not in acute distress.    Appearance: He is well-developed. He is not diaphoretic.  HENT:     Head: Normocephalic and atraumatic.  Nose: Nose normal.  Eyes:     Conjunctiva/sclera: Conjunctivae normal.     Pupils: Pupils are equal, round, and reactive to light.  Cardiovascular:     Rate and Rhythm: Normal rate and regular rhythm.  Pulmonary:     Effort: Pulmonary effort is normal.     Breath sounds: Normal breath sounds. No wheezing or rales.  Abdominal:     General: Bowel sounds are normal.     Palpations: Abdomen is soft.     Tenderness: There is no abdominal tenderness. There is no guarding or rebound.  Musculoskeletal:        General: Normal range of motion.     Cervical back: Normal range of motion and neck supple.     Left lower leg: Swelling and tenderness present.     Left ankle: Normal.     Left Achilles Tendon: Normal.     Left foot: Normal capillary refill.  Skin:    General: Skin is warm and dry.      Capillary Refill: Capillary refill takes less than 2 seconds.  Neurological:     General: No focal deficit present.     Mental Status: He is alert and oriented to person, place, and time.     Deep Tendon Reflexes: Reflexes normal.  Psychiatric:        Mood and Affect: Mood normal.        Behavior: Behavior normal.     ED Results / Procedures / Treatments   Labs (all labs ordered are listed, but only abnormal results are displayed) Results for orders placed or performed during the hospital encounter of 06/03/23  CBC with Differential  Result Value Ref Range   WBC 7.8 4.0 - 10.5 K/uL   RBC 4.48 4.22 - 5.81 MIL/uL   Hemoglobin 14.0 13.0 - 17.0 g/dL   HCT 40.9 81.1 - 91.4 %   MCV 94.2 80.0 - 100.0 fL   MCH 31.3 26.0 - 34.0 pg   MCHC 33.2 30.0 - 36.0 g/dL   RDW 78.2 95.6 - 21.3 %   Platelets 202 150 - 400 K/uL   nRBC 0.0 0.0 - 0.2 %   Neutrophils Relative % 65 %   Neutro Abs 5.0 1.7 - 7.7 K/uL   Lymphocytes Relative 24 %   Lymphs Abs 1.9 0.7 - 4.0 K/uL   Monocytes Relative 8 %   Monocytes Absolute 0.7 0.1 - 1.0 K/uL   Eosinophils Relative 3 %   Eosinophils Absolute 0.2 0.0 - 0.5 K/uL   Basophils Relative 0 %   Basophils Absolute 0.0 0.0 - 0.1 K/uL   Immature Granulocytes 0 %   Abs Immature Granulocytes 0.03 0.00 - 0.07 K/uL  I-stat chem 8, ED (not at [facility], [facility] or [facility])  Result Value Ref Range   Sodium 140 135 - 145 mmol/L   Potassium 4.1 3.5 - 5.1 mmol/L   Chloride 99 98 - 111 mmol/L   BUN 15 8 - 23 mg/dL   Creatinine, Ser 0.86 0.61 - 1.24 mg/dL   Glucose, Bld 578 (H) 70 - 99 mg/dL   Calcium, Ion 4.69 6.29 - 1.40 mmol/L   TCO2 30 22 - 32 mmol/L   Hemoglobin 14.3 13.0 - 17.0 g/dL   HCT 52.8 41.3 - 24.4 %   CT Angio Chest PE W and/or Wo Contrast  Result Date: 06/04/2023 CLINICAL DATA:  77 year old male with history of syncope. EXAM: CT ANGIOGRAPHY CHEST WITH CONTRAST TECHNIQUE: Multidetector CT imaging of the chest was performed  using the standard protocol during bolus  administration of intravenous contrast. Multiplanar CT image reconstructions and MIPs were obtained to evaluate the vascular anatomy. RADIATION DOSE REDUCTION: This exam was performed according to the departmental dose-optimization program which includes automated exposure control, adjustment of the mA and/or kV according to patient size and/or use of iterative reconstruction technique. CONTRAST:  60mL OMNIPAQUE IOHEXOL 350 MG/ML SOLN COMPARISON:  None Available. FINDINGS: Cardiovascular: No filling defects within the pulmonary arterial tree to suggest pulmonary embolism. Heart size is normal. There is no significant pericardial fluid, thickening or pericardial calcification. There is aortic atherosclerosis, as well as atherosclerosis of the great vessels of the mediastinum and the coronary arteries, including calcified atherosclerotic plaque in the left anterior descending and right coronary arteries. Mediastinum/Nodes: No pathologically enlarged mediastinal or hilar lymph nodes. Please note that accurate exclusion of hilar adenopathy is limited on noncontrast CT scans. Esophagus is unremarkable in appearance. No axillary lymphadenopathy. Lungs/Pleura: No acute consolidative airspace disease. No pleural effusions. No pneumothorax. No suspicious appearing pulmonary nodules or masses are noted. Upper Abdomen: Aortic atherosclerosis. Musculoskeletal: There are no aggressive appearing lytic or blastic lesions noted in the visualized portions of the skeleton. Review of the MIP images confirms the above findings. IMPRESSION: 1. No acute findings in the thorax to account for the patient's symptoms. Specifically, no evidence of pulmonary embolism. 2. Aortic atherosclerosis, in addition to 2 vessel coronary artery disease. Please note that although the presence of coronary artery calcium documents the presence of coronary artery disease, the severity of this disease and any potential stenosis cannot be assessed on this  non-gated CT examination. Assessment for potential risk factor modification, dietary therapy or pharmacologic therapy may be warranted, if clinically indicated. Electronically Signed   By: Trudie Reed M.D.   On: 06/04/2023 06:05     Radiology CT Angio Chest PE W and/or Wo Contrast  Result Date: 06/04/2023 CLINICAL DATA:  77 year old male with history of syncope. EXAM: CT ANGIOGRAPHY CHEST WITH CONTRAST TECHNIQUE: Multidetector CT imaging of the chest was performed using the standard protocol during bolus administration of intravenous contrast. Multiplanar CT image reconstructions and MIPs were obtained to evaluate the vascular anatomy. RADIATION DOSE REDUCTION: This exam was performed according to the departmental dose-optimization program which includes automated exposure control, adjustment of the mA and/or kV according to patient size and/or use of iterative reconstruction technique. CONTRAST:  60mL OMNIPAQUE IOHEXOL 350 MG/ML SOLN COMPARISON:  None Available. FINDINGS: Cardiovascular: No filling defects within the pulmonary arterial tree to suggest pulmonary embolism. Heart size is normal. There is no significant pericardial fluid, thickening or pericardial calcification. There is aortic atherosclerosis, as well as atherosclerosis of the great vessels of the mediastinum and the coronary arteries, including calcified atherosclerotic plaque in the left anterior descending and right coronary arteries. Mediastinum/Nodes: No pathologically enlarged mediastinal or hilar lymph nodes. Please note that accurate exclusion of hilar adenopathy is limited on noncontrast CT scans. Esophagus is unremarkable in appearance. No axillary lymphadenopathy. Lungs/Pleura: No acute consolidative airspace disease. No pleural effusions. No pneumothorax. No suspicious appearing pulmonary nodules or masses are noted. Upper Abdomen: Aortic atherosclerosis. Musculoskeletal: There are no aggressive appearing lytic or blastic  lesions noted in the visualized portions of the skeleton. Review of the MIP images confirms the above findings. IMPRESSION: 1. No acute findings in the thorax to account for the patient's symptoms. Specifically, no evidence of pulmonary embolism. 2. Aortic atherosclerosis, in addition to 2 vessel coronary artery disease. Please note that although the presence of  coronary artery calcium documents the presence of coronary artery disease, the severity of this disease and any potential stenosis cannot be assessed on this non-gated CT examination. Assessment for potential risk factor modification, dietary therapy or pharmacologic therapy may be warranted, if clinically indicated. Electronically Signed   By: Trudie Reed M.D.   On: 06/04/2023 06:05    Procedures Procedures    Medications Ordered in ED Medications  iohexol (OMNIPAQUE) 350 MG/ML injection 60 mL (60 mLs Intravenous Contrast Given 06/04/23 0410)    ED Course/ Medical Decision Making/ A&P                                 Medical Decision Making Patient with LLE swelling and pain on Xarelto   Amount and/or Complexity of Data Reviewed External Data Reviewed: notes.    Details: Previous notes reviewed  Labs: ordered.    Details: Normal white count 7.8, normal hemoglobin 14, normal platelet count. Normal sodium 140, normal potassium 4.1 normal creatinine  Radiology: ordered and independent interpretation performed.    Details: Negative CTA by me   Risk Prescription drug management. Risk Details: Patient and wife informed of coronary artery calcifications and need for close follow up with Dr. Ladona Ridgel for additional testing and risk modification.  Order placed for DVT study     Final Clinical Impression(s) / ED Diagnoses Final diagnoses:  ASCVD (arteriosclerotic cardiovascular disease)   Patient signed out to Dr. Rhunette Croft  Rx / DC Orders ED Discharge Orders     None         Jadd Gasior, MD 06/04/23 [phone]

## 2023-06-08 ENCOUNTER — Ambulatory Visit (INDEPENDENT_AMBULATORY_CARE_PROVIDER_SITE_OTHER): Payer: Medicare Other | Admitting: Family Medicine

## 2023-06-08 ENCOUNTER — Encounter: Payer: Self-pay | Admitting: Family Medicine

## 2023-06-08 VITALS — BP 118/78 | HR 60 | Temp 97.5°F | Ht 74.0 in | Wt 180.9 lb

## 2023-06-08 DIAGNOSIS — L03116 Cellulitis of left lower limb: Secondary | ICD-10-CM

## 2023-06-08 DIAGNOSIS — R2242 Localized swelling, mass and lump, left lower limb: Secondary | ICD-10-CM | POA: Diagnosis not present

## 2023-06-08 LAB — SEDIMENTATION RATE: Sed Rate: 11 mm/h (ref 0–20)

## 2023-06-08 LAB — C-REACTIVE PROTEIN: CRP: <1 mg/dL (ref 0.5–20.0)

## 2023-06-08 NOTE — Progress Notes (Signed)
Established Patient Office Visit  Subjective   Patient ID: Garrett Vargas, male    DOB: 1945-09-10  Age: 77 y.o. MRN: [id number]  Chief Complaint  Patient presents with   Medical Management of Chronic Issues    Patient is here for follow up today.   Pt is reporting left lower leg pain and swelling. States that last year he had an injury to his right leg where he tore his achilles and calf muscle. States hat 2 weeks ago he was on a trip and he noticed some tightness in the left calf-- there was a lot of walking involved in this trip. States that he took some aleve. Then he continued to have soreness and his left leg started to swell. Went to the ER and was told he did not have a blood clot, so they treated him as an infection with cephalexin and doxycycline. Is about 1/2 way through his antibiotics currently. Today, he reports that the swelling in about the same, there is more noticeable bruising behind his knee. States that when he initially stands up the first few steps are very painful.     Current Outpatient Medications  Medication Instructions   atenolol (TENORMIN) 25 MG tablet TAKE ONE-HALF TABLET BY MOUTH  DAILY . MAY TAKE AN EXTRA  ONE-HALF TABLET FOR PALPITATIONS OR HEART RACING.   atorvastatin (LIPITOR) 40 mg, Oral, Daily   cephALEXin (KEFLEX) 500 mg, Oral, 4 times daily   doxycycline (VIBRAMYCIN) 100 mg, Oral, 2 times daily   flecainide (TAMBOCOR) 50 mg, Oral, 2 times daily   ipratropium (ATROVENT) 0.03 % nasal spray 1 spray, Each Nare, As needed   Multiple Vitamins-Minerals (CENTRUM SILVER PO) 1 capsule, Oral, Daily   Omega-3 Fatty Acids (FISH OIL PO) 1,040 mg, Oral, Daily   omeprazole (PRILOSEC) 20 mg, Oral, Daily   sildenafil (VIAGRA) 50-100 mg, Oral, Daily PRN   XARELTO 20 MG TABS tablet TAKE 1 TABLET BY MOUTH DAILY  WITH SUPPER      Review of Systems  All other systems reviewed and are negative.     Objective:     BP 118/78 (BP Location: Left Arm, Patient  Position: Sitting, Cuff Size: Normal)   Pulse 60   Temp (!) 97.5 F (36.4 C) (Oral)   Ht 6\' 2"  (1.88 m)   Wt 180 lb 14.4 oz (82.1 kg)   SpO2 98%   BMI 23.23 kg/m    Physical Exam Vitals reviewed.  Constitutional:      Appearance: Normal appearance. He is normal weight.  Cardiovascular:     Pulses: Normal pulses.  Musculoskeletal:     Left lower leg: Edema (the left calf is yellow in appearance, there is extensivel bruising behind the left knee and severe varicose veins throughout the lower leg) present.  Neurological:     Mental Status: He is alert.         No results found for any visits on 06/08/23.    The 10-year ASCVD risk score (Arnett DK, et al., 2019) is: 26.8%    Assessment & Plan:  Localized swelling of left lower extremity -     MR TIBIA FIBULA LEFT WO CONTRAST; Future  Cellulitis of left leg without foot No redness to indicate an infection, pt reports that his leg has been stable in this state for the past 2 days. Differential includes hematoma of the leg from a burst varicose vein, or he could have injured the soft tissue some other way on his trip.  I will order ESR and CRP, also will order MRI of the left lower leg to look more closely at the pathology. 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 I advised he use a compression stocking on the left leg to help with the swelling. Finish antibiotics as prescribed   -     MR TIBIA FIBULA LEFT WO CONTRAST; Future -     Sedimentation rate -     C-reactive protein     Return in about 6 months (around 12/06/2023) for annual physical exam.    Karie Georges, MD

## 2023-06-08 NOTE — Patient Instructions (Signed)
Mild compression-- 10-15 mmHG wear the stocking daily

## 2023-06-09 ENCOUNTER — Telehealth: Payer: Self-pay | Admitting: Internal Medicine

## 2023-06-09 ENCOUNTER — Telehealth: Payer: Self-pay | Admitting: Family Medicine

## 2023-06-09 NOTE — Telephone Encounter (Signed)
Patient has a loop recorder. No issue with an MRI. Called [facility] to relay information, spoke to [place] about loop information.  Patient called back to advise information. Appreciative of call back.

## 2023-06-09 NOTE — Telephone Encounter (Signed)
Spoke with the patient and advised since he was not aware of this, to contact the cardiology office and provide this information to [facility].

## 2023-06-09 NOTE — Telephone Encounter (Signed)
Garrett Vargas [facility] is calling and need loop recording info in pt chest before they can sch MRI

## 2023-06-09 NOTE — Telephone Encounter (Signed)
Patient called to talk with Dr. Ladona Ridgel or nurse in regards to getting an MRI

## 2023-06-09 NOTE — Progress Notes (Unsigned)
  Electrophysiology Office Note:   Date:  06/10/2023  ID:  Garrett Vargas, DOB January 28, 1946, MRN [id number]  Primary Cardiologist: None Electrophysiologist: Lewayne Bunting, MD      History of Present Illness:   Garrett Vargas is a 77 y.o. male with h/o PAF, tachy induced CM with recovered EF, remote tobacco abuse, and syncope s/p loop seen today for post hospital follow up.    Seen at [facility] ED 06/04/2023 with leg pain and pt was concerned about DVT after long trip that included air travel and lots of walking. CTA negative. Negative DVT US.   Since discharge from hospital the patient reports doing well from a cardiac perspective. He has continued to have swelling, with dependent edema and ecchymosis in his left leg. Otherwise,  he denies chest pain, palpitations, dyspnea, PND, orthopnea, nausea, vomiting, dizziness, syncope, weight gain, or early satiety.   Review of systems complete and found to be negative unless listed in HPI.   EP Information / Studies Reviewed:    EKG is not ordered today. EKG from 04/01/2023 reviewed which showed NSR at 70 bpm with 1st degree AV block and RBBB      Physical Exam:   VS:  BP 110/66 (BP Location: Left Arm, Patient Position: Sitting, Cuff Size: Normal)   Pulse 68   Ht 6\' 2"  (1.88 m)   Wt 183 lb 6.4 oz (83.2 kg)   SpO2 98%   BMI 23.55 kg/m    Wt Readings from Last 3 Encounters:  06/10/23 183 lb 6.4 oz (83.2 kg)  06/08/23 180 lb 14.4 oz (82.1 kg)  06/04/23 181 lb 3.5 oz (82.2 kg)     GEN: Well nourished, well developed in no acute distress NECK: No JVD; No carotid bruits CARDIAC: Regular rate and rhythm, no murmurs, rubs, gallops RESPIRATORY:  Clear to auscultation without rales, wheezing or rhonchi  ABDOMEN: Soft, non-tender, non-distended EXTREMITIES:  Left leg with 1-2+ pitting edema, Dependent ecchymosis in his foot and thigh.  ASSESSMENT AND PLAN:    Paroxysmal AF EKG in August showed stable intervals Continue flecainide 50 mg  BID Continue Xarelto 20 mg daily  Syncope He did not have pauses on the duration of his loop recorder.  HTN Stable on current regimen   LE edema Strong suspicion of rupture backers cyst with how much activity he was doing while he was [place] and dependent nature of edema.  Would hold Xarelto for 7 days while healing and work up is on-going as DVT and PE work up was negative. Discussed with Dr. Ladona Ridgel who agrees  Coronary artery calcifications Incidental finding on CTA PE Follow conservatively per Dr. Ladona Ridgel.     Follow up with Dr. Ladona Ridgel in 6 months  Signed, Graciella Freer, PA-C

## 2023-06-10 ENCOUNTER — Ambulatory Visit: Payer: Medicare Other | Attending: Student | Admitting: Student

## 2023-06-10 ENCOUNTER — Encounter: Payer: Self-pay | Admitting: Student

## 2023-06-10 VITALS — BP 110/66 | HR 68 | Ht 74.0 in | Wt 183.4 lb

## 2023-06-10 DIAGNOSIS — I480 Paroxysmal atrial fibrillation: Secondary | ICD-10-CM | POA: Diagnosis not present

## 2023-06-10 DIAGNOSIS — R55 Syncope and collapse: Secondary | ICD-10-CM | POA: Diagnosis not present

## 2023-06-10 DIAGNOSIS — I10 Essential (primary) hypertension: Secondary | ICD-10-CM

## 2023-06-10 NOTE — Patient Instructions (Addendum)
Medication Instructions:  You may hold your Xarelto for 1 week. *If you need a refill on your cardiac medications before your next appointment, please call your pharmacy*  Lab Work: None ordered If you have labs (blood work) drawn today and your tests are completely normal, you will receive your results only by: MyChart Message (if you have MyChart) OR A paper copy in the mail If you have any lab test that is abnormal or we need to change your treatment, we will call you to review the results.  Follow-Up: At [facility], you and your health needs are our priority.  As part of our continuing mission to provide you with exceptional heart care, we have created designated Provider Care Teams.  These Care Teams include your primary Cardiologist (physician) and Advanced Practice Providers (APPs -  Physician Assistants and Nurse Practitioners) who all work together to provide you with the care you need, when you need it.  Your next appointment:   6 month(s)  Provider:   Lewayne Bunting, MD

## 2023-06-12 ENCOUNTER — Telehealth: Payer: Self-pay | Admitting: Family Medicine

## 2023-06-12 NOTE — Telephone Encounter (Signed)
Pt called back and disregard previous message. Pt has been schedule

## 2023-06-12 NOTE — Telephone Encounter (Signed)
Referred for MRI referral #[id number] , pt has had issues with the facility and is requesting to be redirected to another MRI facility

## 2023-06-13 ENCOUNTER — Ambulatory Visit
Admission: RE | Admit: 2023-06-13 | Discharge: 2023-06-13 | Disposition: A | Discharge status: Home / Self Care | Payer: Medicare Other | Source: Ambulatory Visit | Attending: Family Medicine | Admitting: Family Medicine

## 2023-06-13 DIAGNOSIS — S86112A Strain of other muscle(s) and tendon(s) of posterior muscle group at lower leg level, left leg, initial encounter: Secondary | ICD-10-CM | POA: Diagnosis not present

## 2023-06-13 DIAGNOSIS — R2242 Localized swelling, mass and lump, left lower limb: Secondary | ICD-10-CM

## 2023-06-13 DIAGNOSIS — L03116 Cellulitis of left lower limb: Secondary | ICD-10-CM

## 2023-11-13 ENCOUNTER — Other Ambulatory Visit: Payer: Self-pay | Admitting: Family Medicine

## 2023-12-07 ENCOUNTER — Encounter: Payer: Medicare Other | Admitting: Family Medicine

## 2023-12-07 DIAGNOSIS — H2513 Age-related nuclear cataract, bilateral: Secondary | ICD-10-CM | POA: Diagnosis not present

## 2023-12-14 ENCOUNTER — Encounter: Admitting: Family Medicine

## 2023-12-15 ENCOUNTER — Encounter: Payer: Self-pay | Admitting: Family Medicine

## 2023-12-15 ENCOUNTER — Ambulatory Visit (INDEPENDENT_AMBULATORY_CARE_PROVIDER_SITE_OTHER): Admitting: Family Medicine

## 2023-12-15 VITALS — BP 120/80 | HR 65 | Temp 97.8°F | Ht 73.25 in | Wt 180.5 lb

## 2023-12-15 DIAGNOSIS — Z131 Encounter for screening for diabetes mellitus: Secondary | ICD-10-CM

## 2023-12-15 DIAGNOSIS — N529 Male erectile dysfunction, unspecified: Secondary | ICD-10-CM

## 2023-12-15 DIAGNOSIS — E782 Mixed hyperlipidemia: Secondary | ICD-10-CM | POA: Diagnosis not present

## 2023-12-15 DIAGNOSIS — I10 Essential (primary) hypertension: Secondary | ICD-10-CM

## 2023-12-15 DIAGNOSIS — Z0000 Encounter for general adult medical examination without abnormal findings: Secondary | ICD-10-CM | POA: Diagnosis not present

## 2023-12-15 DIAGNOSIS — Z125 Encounter for screening for malignant neoplasm of prostate: Secondary | ICD-10-CM

## 2023-12-15 LAB — CBC WITH DIFFERENTIAL/PLATELET
Basophils Absolute: 0 10*3/uL (ref 0.0–0.1)
Basophils Relative: 0.4 % (ref 0.0–3.0)
Eosinophils Absolute: 0.2 10*3/uL (ref 0.0–0.7)
Eosinophils Relative: 2.1 % (ref 0.0–5.0)
HCT: 45.9 % (ref 39.0–52.0)
Hemoglobin: 15.4 g/dL (ref 13.0–17.0)
Lymphocytes Relative: 19.5 % (ref 12.0–46.0)
Lymphs Abs: 1.7 10*3/uL (ref 0.7–4.0)
MCHC: 33.5 g/dL (ref 30.0–36.0)
MCV: 93.7 fl (ref 78.0–100.0)
Monocytes Absolute: 0.6 10*3/uL (ref 0.1–1.0)
Monocytes Relative: 6.9 % (ref 3.0–12.0)
Neutro Abs: 6.1 10*3/uL (ref 1.4–7.7)
Neutrophils Relative %: 71.1 % (ref 43.0–77.0)
Platelets: 236 10*3/uL (ref 150.0–400.0)
RBC: 4.9 Mil/uL (ref 4.22–5.81)
RDW: 13.9 % (ref 11.5–15.5)
WBC: 8.7 10*3/uL (ref 4.0–10.5)

## 2023-12-15 LAB — COMPREHENSIVE METABOLIC PANEL WITH GFR
ALT: 21 U/L (ref 0–53)
AST: 23 U/L (ref 0–37)
Albumin: 4.3 g/dL (ref 3.5–5.2)
Alkaline Phosphatase: 62 U/L (ref 39–117)
BUN: 13 mg/dL (ref 6–23)
CO2: 27 meq/L (ref 19–32)
Calcium: 9.2 mg/dL (ref 8.4–10.5)
Chloride: 101 meq/L (ref 96–112)
Creatinine, Ser: 0.95 mg/dL (ref 0.40–1.50)
GFR: 76.77 mL/min (ref >60.00)
Glucose, Bld: 109 mg/dL — ABNORMAL HIGH (ref 70–99)
Potassium: 3.9 meq/L (ref 3.5–5.1)
Sodium: 138 meq/L (ref 135–145)
Total Bilirubin: 0.9 mg/dL (ref 0.2–1.2)
Total Protein: 6.3 g/dL (ref 6.0–8.3)

## 2023-12-15 LAB — LIPID PANEL
Cholesterol: 160 mg/dL (ref 0–200)
HDL: 50.4 mg/dL (ref >39.00)
LDL Cholesterol: 72 mg/dL (ref 0–99)
NonHDL: 109.88
Total CHOL/HDL Ratio: 3
Triglycerides: 187 mg/dL — ABNORMAL HIGH (ref 0.0–149.0)
VLDL: 37.4 mg/dL (ref 0.0–40.0)

## 2023-12-15 LAB — HEMOGLOBIN A1C: Hgb A1c MFr Bld: 5.1 % (ref 4.6–6.5)

## 2023-12-15 LAB — PSA: PSA: 2.05 ng/mL (ref 0.10–4.00)

## 2023-12-15 MED ORDER — TADALAFIL 20 MG PO TABS: 20.0000 mg | ORAL_TABLET | Freq: Every day | ORAL | 5 refills | Status: AC | PRN

## 2023-12-15 NOTE — Progress Notes (Signed)
 Annual Wellness Visit     Patient: Garrett Vargas, Male    DOB: 1946-07-17, 78 y.o.   MRN: [id number]  Subjective  Chief Complaint  Patient presents with   Annual Exam   Fatigue    Patient complains of 2 days in which he "felt very weak and exhausted" 2 weeks ago, states he took a nap, rested and felt better    Garrett Vargas is a 78 y.o. male who presents today for his Annual Wellness Visit. He reports consuming a general diet. Home exercise routine includes goes to the health club and does weights and stationary bike, plays golf twice a week also. He generally feels well. He reports sleeping fairly well. He does not have additional problems to discuss today.   Pt is also here for his subsequent medicare visit. I have performed the questionaires  in person.     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 MAY TAKE AN EXTRA  ONE-HALF TABLET FOR PALPITATIONS OR HEART RACING.   atorvastatin  (LIPITOR) 40 MG tablet TAKE 1 TABLET BY MOUTH DAILY   cephALEXin  (KEFLEX ) 500 MG capsule Take 1 capsule (500 mg total) by mouth 4 (four) times daily.   doxycycline  (VIBRAMYCIN ) 100 MG capsule Take 1 capsule (100 mg total) by mouth 2 (two) times daily.   flecainide  (TAMBOCOR ) 50 MG tablet  TAKE 1 TABLET BY MOUTH TWICE  DAILY   ipratropium (ATROVENT) 0.03 % nasal spray Place 1 spray into both nostrils as needed.   Multiple Vitamins-Minerals (CENTRUM SILVER PO) Take 1 capsule by mouth daily.   Omega-3 Fatty Acids (FISH OIL PO) Take 1,040 mg by mouth daily.   omeprazole (PRILOSEC) 20 MG capsule Take 20 mg by mouth daily.   sildenafil  (VIAGRA ) 100 MG tablet Take 0.5-1 tablets (50-100 mg total) by mouth daily as needed for erectile dysfunction.   XARELTO  20 MG TABS tablet TAKE 1 TABLET BY MOUTH DAILY  WITH SUPPER   No facility-administered medications prior to visit.    No Known Allergies  Patient Care Team: Aida House, MD as PCP - General (Family Medicine) Tammie Fall, MD as PCP - Electrophysiology (Cardiology) Katherin Pam, MD as Referring Physician (Dermatology) Alver Austin, [facility] (Inactive) as Pharmacist (Pharmacist)  Review of Systems  HENT:  Negative for hearing loss.   Eyes:  Negative for blurred vision.  Respiratory:  Negative for shortness of breath.   Cardiovascular:  Negative for chest pain.  Gastrointestinal: Negative.   Genitourinary: Negative.   Musculoskeletal:  Negative for back pain.  Neurological:  Negative for headaches.  Psychiatric/Behavioral:  Negative for depression.         Objective  BP 120/80   Pulse 65   Temp 97.8 F (36.6 C) (Oral)   Ht 6' 1.25" (  1.861 m)   Wt 180 lb 8 oz (81.9 kg)   SpO2 98%   BMI 23.65 kg/m    Physical Exam Vitals reviewed.  Constitutional:      Appearance: Normal appearance. He is well-groomed and normal weight.  HENT:     Right Ear: Tympanic membrane and ear canal normal.     Left Ear: Tympanic membrane and ear canal normal.     Mouth/Throat:     Mouth: Mucous membranes are moist.     Pharynx: No posterior oropharyngeal erythema.  Eyes:     Extraocular Movements: Extraocular movements intact.     Conjunctiva/sclera: Conjunctivae normal.  Neck:     Thyroid : No thyromegaly.   Cardiovascular:     Rate and Rhythm: Normal rate and regular rhythm.     Heart sounds: S1 normal and S2 normal. No murmur heard. Pulmonary:     Effort: Pulmonary effort is normal.     Breath sounds: Normal breath sounds and air entry. No rales.  Abdominal:     General: Abdomen is flat. Bowel sounds are normal.  Musculoskeletal:     Right lower leg: No edema.     Left lower leg: No edema.  Lymphadenopathy:     Cervical: No cervical adenopathy.  Neurological:     General: No focal deficit present.     Mental Status: He is alert and oriented to person, place, and time.     Gait: Gait is intact.  Psychiatric:        Mood and Affect: Mood and affect normal.       Most recent functional status assessment:    12/15/2023    3:46 PM  In your present state of health, do you have any difficulty performing the following activities:  Hearing? 0  Vision? 0  Difficulty concentrating or making decisions? 0  Walking or climbing stairs? 0  Dressing or bathing? 0  Doing errands, shopping? 0  Preparing Food and eating ? N  Using the Toilet? N  In the past six months, have you accidently leaked urine? N  Do you have problems with loss of bowel control? N  Managing your Medications? N  Managing your Finances? N  Housekeeping or managing your Housekeeping? N   Most recent fall risk assessment:    12/15/2023    1:59 PM  Fall Risk   Falls in the past year? 0  Number falls in past yr: 0  Injury with Fall? 0  Risk for fall due to : No Fall Risks  Follow up Falls evaluation completed    Most recent depression screenings:    12/15/2023    2:00 PM 06/08/2023   11:10 AM  PHQ 2/9 Scores  PHQ - 2 Score 0 0  PHQ- 9 Score 1 0   Most recent cognitive screening:    12/15/2023    3:45 PM  6CIT Screen  What Year? 0 points  What month? 0 points  What time? 0 points  Count back from 20 0 points  Months in reverse 0 points  Repeat phrase 0 points  Total Score 0 points   Most recent  Audit-C alcohol use screening    12/15/2023    3:49 PM  Alcohol Use Disorder Test (AUDIT)  1. How often do you have a drink containing alcohol? 4  2. How many drinks containing alcohol do you have on a typical day when you are drinking? 0  3. How often do you have six or more drinks on  one occasion? 0  AUDIT-C Score 4  4. How often during the last year have you found that you were not able to stop drinking once you had started? 0  5. How often during the last year have you failed to do what was normally expected from you because of drinking? 0  6. How often during the last year have you needed a first drink in the morning to get yourself going after a heavy drinking session? 0  7. How often during the last year have you had a feeling of guilt of remorse after drinking? 0  8. How often during the last year have you been unable to remember what happened the night before because you had been drinking? 0  9. Have you or someone else been injured as a result of your drinking? 0  10. Has a relative or friend or a doctor or another health worker been concerned about your drinking or suggested you cut down? 0  Alcohol Use Disorder Identification Test Final Score (AUDIT) 4   A score of 3 or more in women, and 4 or more in men indicates increased risk for alcohol abuse, EXCEPT if all of the points are from question 1   Vision/Hearing Screen: No results found.    No results found for any visits on 12/15/23.    Assessment & Plan    I have personally reviewed and noted the following in the patient's chart:   Medical and social history Use of alcohol, tobacco or illicit drugs  Current medications and supplements including opioid prescriptions. Patient is not currently taking opioid prescriptions. Functional ability and status Nutritional status Physical activity Advanced directives List of other physicians Hospitalizations, surgeries, and ER visits in previous 12 months Vitals Screenings  to include cognitive, depression, and falls Referrals and appointments  In addition, I have reviewed and discussed with patient certain preventive protocols, quality metrics, and best practice recommendations. A written personalized care plan for preventive services as well as general preventive health recommendations were provided to patient. Exercise Activities and Dietary recommendations  Goals       Exercise 3x per week (30 min per time) (pt-stated)      Be able to walk 18 holes for golf.       patient      To maintain your health and exercise  Take time to travel and enjoy your time when pandemic settles          Immunization History  Administered Date(s) Administered   Fluad Quad(high Dose 65+) 04/19/2021, 04/10/2022   Hepatitis A 07/02/2012, 01/10/2013   Influenza Nasal 05/04/2017   Influenza Split 04/27/2012   Influenza Whole 05/04/2009   Influenza, High Dose Seasonal PF 06/04/2018   Influenza,inj,Quad PF,6+ Mos 05/05/2019   Influenza-Unspecified 05/04/2014, 05/07/2015, 04/28/2016, 05/04/2020, 04/19/2023   Moderna Covid-19 Vaccine Bivalent Booster 36yrs & up 05/05/2022   Moderna Sars-Covid-2 Vaccination 05/05/2022   PFIZER(Purple Top)SARS-COV-2 Vaccination 08/17/2019, 09/05/2019, 04/12/2020, 11/13/2020   PPD Test 04/16/2011, 04/27/2012, 04/27/2013, 04/11/2014   Pfizer Covid-19 Vaccine Bivalent Booster 17yrs & up 08/17/2019, 05/06/2021, 01/23/2022   Pfizer Covid-19 Vaccine Bivalent Booster 5y-11y 09/05/2019   Pneumococcal Conjugate-13 04/06/2014   Pneumococcal Polysaccharide-23 12/10/2010   RSV,unspecified 05/01/2022   Respiratory Syncytial Virus Vaccine,Recomb Aduvanted(Arexvy) 05/01/2022   Td 01/08/2005   Tdap 05/09/2015   Zoster Recombinant(Shingrix) 09/02/2018, 02/23/2019   Zoster, Live 08/13/2009    Health Maintenance  Topic Date Due   COVID-19 Vaccine (10 - 2024-25 season) 04/05/2023   Medicare Annual Wellness (  AWV)  11/18/2023   INFLUENZA VACCINE   03/04/2024   DTaP/Tdap/Td (3 - Td or Tdap) 05/08/2025   Colonoscopy  11/19/2025   Pneumonia Vaccine 4+ Years old  Completed   Hepatitis C Screening  Completed   Zoster Vaccines- Shingrix  Completed   HPV VACCINES  Aged Out   Meningococcal B Vaccine  Aged Out     Discussed health benefits of physical activity, and encouraged him to engage in regular exercise appropriate for his age and condition.    Problem List Items Addressed This Visit       Unprioritized   Hyperlipemia   Relevant Medications   tadalafil  (CIALIS ) 20 MG tablet   Other Relevant Orders   Lipid panel   ESSENTIAL HYPERTENSION, BENIGN   Relevant Medications   tadalafil  (CIALIS ) 20 MG tablet   Other Relevant Orders   Comprehensive metabolic panel   CBC with Differential   Other Visit Diagnoses       Erectile dysfunction, unspecified erectile dysfunction type    -  Primary   Relevant Medications   tadalafil  (CIALIS ) 20 MG tablet     Diabetes mellitus screening       Relevant Orders   Hemoglobin A1c     Prostate cancer screening       Relevant Orders   PSA(Must document that pt has been informed of limitations of PSA testing.)     Routine general medical examination at a health care facility      Physical exam findings are normal today. I reviewed his health maintenance and he is UTD. Also reviewed vaccinations which are also UTD. Handouts given on healthy eating and exercise and I counseled him on healthy sleep habits.      Return in 1 year (on 12/14/2024).     Aida House, MD

## 2023-12-15 NOTE — Patient Instructions (Addendum)
 5 mg melatonin Health Maintenance, Male Adopting a healthy lifestyle and getting preventive care are important in promoting health and wellness. Ask your health care provider about: The right schedule for you to have regular tests and exams. Things you can do on your own to prevent diseases and keep yourself healthy. What should I know about diet, weight, and exercise? Eat a healthy diet  Eat a diet that includes plenty of vegetables, fruits, low-fat dairy products, and lean protein. Do not eat a lot of foods that are high in solid fats, added sugars, or sodium. Maintain a healthy weight Body mass index (BMI) is a measurement that can be used to identify possible weight problems. It estimates body fat based on height and weight. Your health care provider can help determine your BMI and help you achieve or maintain a healthy weight. Get regular exercise Get regular exercise. This is one of the most important things you can do for your health. Most adults should: Exercise for at least 150 minutes each week. The exercise should increase your heart rate and make you sweat (moderate-intensity exercise). Do strengthening exercises at least twice a week. This is in addition to the moderate-intensity exercise. Spend less time sitting. Even light physical activity can be beneficial. Watch cholesterol and blood lipids Have your blood tested for lipids and cholesterol at 78 years of age, then have this test every 5 years. You may need to have your cholesterol levels checked more often if: Your lipid or cholesterol levels are high. You are older than 78 years of age. You are at high risk for heart disease. What should I know about cancer screening? Many types of cancers can be detected early and may often be prevented. Depending on your health history and family history, you may need to have cancer screening at various ages. This may include screening for: Colorectal cancer. Prostate cancer. Skin  cancer. Lung cancer. What should I know about heart disease, diabetes, and high blood pressure? Blood pressure and heart disease High blood pressure causes heart disease and increases the risk of stroke. This is more likely to develop in people who have high blood pressure readings or are overweight. Talk with your health care provider about your target blood pressure readings. Have your blood pressure checked: Every 3-5 years if you are 72-6 years of age. Every year if you are 6 years old or older. If you are between the ages of 85 and 87 and are a current or former smoker, ask your health care provider if you should have a one-time screening for abdominal aortic aneurysm (AAA). Diabetes Have regular diabetes screenings. This checks your fasting blood sugar level. Have the screening done: Once every three years after age 15 if you are at a normal weight and have a low risk for diabetes. More often and at a younger age if you are overweight or have a high risk for diabetes. What should I know about preventing infection? Hepatitis B If you have a higher risk for hepatitis B, you should be screened for this virus. Talk with your health care provider to find out if you are at risk for hepatitis B infection. Hepatitis C Blood testing is recommended for: Everyone born from [birth year] through 1965. Anyone with known risk factors for hepatitis C. Sexually transmitted infections (STIs) You should be screened each year for STIs, including gonorrhea and chlamydia, if: You are sexually active and are younger than 78 years of age. You are older than 78 years of  age and your health care provider tells you that you are at risk for this type of infection. Your sexual activity has changed since you were last screened, and you are at increased risk for chlamydia or gonorrhea. Ask your health care provider if you are at risk. Ask your health care provider about whether you are at high risk for HIV. Your health  care provider may recommend a prescription medicine to help prevent HIV infection. If you choose to take medicine to prevent HIV, you should first get tested for HIV. You should then be tested every 3 months for as long as you are taking the medicine. Follow these instructions at home: Alcohol use Do not drink alcohol if your health care provider tells you not to drink. If you drink alcohol: Limit how much you have to 0-2 drinks a day. Know how much alcohol is in your drink. In the [place] one drink equals one 12 oz bottle of beer (355 mL), one 5 oz glass of wine (148 mL), or one 1 oz glass of hard liquor (44 mL). Lifestyle Do not use any products that contain nicotine or tobacco. These products include cigarettes, chewing tobacco, and vaping devices, such as e-cigarettes. If you need help quitting, ask your health care provider. Do not use street drugs. Do not share needles. Ask your health care provider for help if you need support or information about quitting drugs. General instructions Schedule regular health, dental, and eye exams. Stay current with your vaccines. Tell your health care provider if: You often feel depressed. You have ever been abused or do not feel safe at home. Summary Adopting a healthy lifestyle and getting preventive care are important in promoting health and wellness. Follow your health care provider's instructions about healthy diet, exercising, and getting tested or screened for diseases. Follow your health care provider's instructions on monitoring your cholesterol and blood pressure. This information is not intended to replace advice given to you by your health care provider. Make sure you discuss any questions you have with your health care provider. Document Revised: 12/10/2020 Document Reviewed: 12/10/2020 Elsevier Patient Education  2024 [employer].

## 2023-12-17 ENCOUNTER — Ambulatory Visit: Payer: Self-pay | Admitting: Family Medicine

## 2023-12-31 ENCOUNTER — Ambulatory Visit: Attending: Internal Medicine | Admitting: Internal Medicine

## 2023-12-31 ENCOUNTER — Encounter: Payer: Self-pay | Admitting: Internal Medicine

## 2023-12-31 VITALS — BP 122/80 | HR 66 | Ht 74.0 in | Wt 178.8 lb

## 2023-12-31 DIAGNOSIS — Z7901 Long term (current) use of anticoagulants: Secondary | ICD-10-CM | POA: Diagnosis not present

## 2023-12-31 DIAGNOSIS — Z860100 Personal history of colon polyps, unspecified: Secondary | ICD-10-CM | POA: Diagnosis not present

## 2023-12-31 DIAGNOSIS — I480 Paroxysmal atrial fibrillation: Secondary | ICD-10-CM

## 2023-12-31 NOTE — Patient Instructions (Signed)
 Medication Instructions:  Your physician recommends that you continue on your current medications as directed. Please refer to the Current Medication list given to you today.  *If you need a refill on your cardiac medications before your next appointment, please call your pharmacy*  Lab Work: None ordered.  You may go to any [facility] Location for your lab work:  [facility] - [address] ([facility]) - [address] [phone] [address]  [place] - [address]   [place]  - [address]   [place] - [address] - [address] [facility] [facility]  - [address] [place] - [address] ([facility]   If you have labs (blood work) drawn today and your tests are completely normal, you will receive your results only by: [employer] (if you have MyChart)  If you have any lab test that is abnormal or we need to change your treatment, we will call you or send a MyChart message to review the results.  Testing/Procedures: None ordered.  Follow-Up: At [facility], you and your health needs are our priority.  As part of our continuing mission to provide you with exceptional heart care, we have created designated Provider Care Teams.  These Care Teams include your primary Cardiologist (physician) and Advanced Practice Providers (APPs -  Physician Assistants and Nurse Practitioners) who all work together to provide you with the care you need, when you need it.  We recommend signing up for the patient portal called "MyChart".  Sign up information is provided on this After Visit Summary.  MyChart is used to connect with patients for Virtual Visits (Telemedicine).  Patients are able to view lab/test results, encounter notes, upcoming appointments, etc.  Non-urgent messages can be sent to your provider as well.   To learn more about what you can do with MyChart, go to  [email or web address].    Your next appointment:   1 year(s)  The format for your next appointment:   In Person  Provider:   Manya Sells, MD{or one of the following Advanced Practice Providers on your designated Care Team:   Mertha Abrahams, [place] [address] [place], [place] Neda Balk, NP

## 2023-12-31 NOTE — Progress Notes (Signed)
 HPI Dr. Daina Drum returns today for followup. He is a pleasant 78 yo man with a h/o PAF, tachy induced CM that resolved, remote tobacco abuse and syncope, s/p ILR insertion. Several months ago his atrial fib/flutter was increasing in frequency and he was started on low dose flecainide . He has done well in the interim. He has not had palpitations or syncope. No chest pain or sob. No edema. Despite a h/o syncope he has not had any arrhythmias on his ILR and we quit following over a year ago. Overall he feel well and monitors his HR with his Apple watch. He describes an episode of spontaneous bleeding in his left leg after [place] in [place].  No Known Allergies   Current Outpatient Medications  Medication Sig Dispense Refill   atenolol  (TENORMIN ) 25 MG tablet TAKE ONE-HALF TABLET BY MOUTH  DAILY . MAY TAKE AN EXTRA  ONE-HALF TABLET FOR PALPITATIONS OR HEART RACING. 30 tablet 9   atorvastatin  (LIPITOR) 40 MG tablet TAKE 1 TABLET BY MOUTH DAILY 100 tablet 0   flecainide  (TAMBOCOR ) 50 MG tablet TAKE 1 TABLET BY MOUTH TWICE  DAILY 60 tablet 9   ipratropium (ATROVENT) 0.03 % nasal spray Place 1 spray into both nostrils as needed.     Multiple Vitamins-Minerals (CENTRUM SILVER PO) Take 1 capsule by mouth daily.     Omega-3 Fatty Acids (FISH OIL PO) Take 1,040 mg by mouth daily.     omeprazole (PRILOSEC) 20 MG capsule Take 20 mg by mouth daily.     tadalafil  (CIALIS ) 20 MG tablet Take 1 tablet (20 mg total) by mouth daily as needed for erectile dysfunction. 10 tablet 5   XARELTO  20 MG TABS tablet TAKE 1 TABLET BY MOUTH DAILY  WITH SUPPER 100 tablet 2   No current facility-administered medications for this visit.     Past Medical History:  Diagnosis Date   A-fib (HCC)    ALLERGIC RHINITIS    Arthritis    Colon polyp    Dysrhythmia    Erectile dysfunction    GERD (gastroesophageal reflux disease)    History of bronchitis as a child    Hyperlipemia    Hypertension     S/P lateral meniscectomy of left knee     ROS:   All systems reviewed and negative except as noted in the HPI.   Past Surgical History:  Procedure Laterality Date   CARDIOVERSION N/A 03/17/2014   Procedure: CARDIOVERSION;  Surgeon: Luana Rumple, MD;  Location: [facility] ENDOSCOPY;  Service: Cardiovascular;  Laterality: N/A;   COLONOSCOPY WITH PROPOFOL  N/A 11/19/2020   Procedure: COLONOSCOPY WITH PROPOFOL ;  Surgeon: Brice Campi Albino Alu., MD;  Location: [facility] ENDOSCOPY;  Service: Gastroenterology;  Laterality: N/A;   colonscopy      ELBOW ARTHROSCOPY Bilateral    ENDOSCOPIC MUCOSAL RESECTION N/A 11/19/2020   Procedure: ENDOSCOPIC MUCOSAL RESECTION;  Surgeon: Brice Campi Albino Alu., MD;  Location: [facility] ENDOSCOPY;  Service: Gastroenterology;  Laterality: N/A;   HEMOSTASIS CLIP PLACEMENT  11/19/2020   Procedure: HEMOSTASIS CLIP PLACEMENT;  Surgeon: Normie Becton., MD;  Location: [facility] ENDOSCOPY;  Service: Gastroenterology;;   LOOP RECORDER INSERTION N/A 04/12/2018   Procedure: LOOP RECORDER INSERTION;  Surgeon: Tammie Fall, MD;  Location: [facility] INVASIVE CV LAB;  Service: Cardiovascular;  Laterality: N/A;   Multiple Knee,elbow and foot surgeries     neuroma left foot,2010, Dr. Pasqual Bone     POLYPECTOMY  11/19/2020   Procedure: POLYPECTOMY;  Surgeon: Brice Campi,  Albino Alu., MD;  Location: [facility] ENDOSCOPY;  Service: Gastroenterology;;   Jona Negro INJECTION  11/19/2020   Procedure: SUBMUCOSAL LIFTING INJECTION;  Surgeon: Normie Becton., MD;  Location: [facility] ENDOSCOPY;  Service: Gastroenterology;;   TEE WITHOUT CARDIOVERSION N/A 03/17/2014   Procedure: TRANSESOPHAGEAL ECHOCARDIOGRAM (TEE);  Surgeon: Luana Rumple, MD;  Location: [facility] ENDOSCOPY;  Service: Cardiovascular;  Laterality: N/A;   TOTAL HIP ARTHROPLASTY Left 07/18/2015   Procedure: LEFT TOTAL HIP ARTHROPLASTY ANTERIOR APPROACH;  Surgeon: Liliane Rei, MD;  Location: [facility] ORS;  Service: Orthopedics;  Laterality: Left;    TRIGGER FINGER RELEASE Right 08/30/2021   Procedure: RELEASE TRIGGER FINGER/A-1 PULLEY RIGHT RING FINGER;  Surgeon: Lyanne Sample, MD;  Location: [facility];  Service: Orthopedics;  Laterality: Right;  45 MIN     Family History  Problem Relation Age of Onset   Coronary artery disease Other    Prostate cancer Other    Stroke Other    Heart disease Mother    Stroke Mother 8   Prostate cancer Father 26   Kidney disease Sister      Social History   Socioeconomic History   Marital status: Married    Spouse name: Not on file   Number of children: 3   Years of education: Not on file   Highest education level: Professional school degree (e.g., MD, DDS, DVM, JD)  Occupational History   Occupation: retired Transport planner   Occupation: former smoker   Occupation: Engineer, structural use-yes   Occupation: Drug use-no   Occupation: regular exercise-yes  Tobacco Use   Smoking status: Former    Current packs/day: 0.00    Average packs/day: 0.5 packs/day for 10.0 years (5.0 ttl pk-yrs)    Types: Cigarettes    Start date: 08/04/1977    Quit date: 08/05/1987    Years since quitting: 36.4   Smokeless tobacco: Never  Vaping Use   Vaping status: Never Used  Substance and Sexual Activity   Alcohol use: Yes    Alcohol/week: 0.0 standard drinks of alcohol    Comment: occasional   Drug use: Not Currently   Sexual activity: Yes  Other Topics Concern   Not on file  Social History Narrative   Not on file   Social Drivers of Health   Financial Resource Strain: Low Risk  (12/15/2023)   Overall Financial Resource Strain (CARDIA)    Difficulty of Paying Living Expenses: Not hard at all  Food Insecurity: No Food Insecurity (12/15/2023)   Hunger Vital Sign    Worried About Running Out of Food in the Last Year: Never true    Ran Out of Food in the Last Year: Never true  Transportation Needs: No Transportation Needs (12/15/2023)   PRAPARE - Administrator, Civil Service (Medical): No     Lack of Transportation (Non-Medical): No  Physical Activity: Unknown (12/15/2023)   Exercise Vital Sign    Days of Exercise per Week: 2 days    Minutes of Exercise per Session: Not on file  Stress: No Stress Concern Present (12/15/2023)   [employer] of Occupational Health - Occupational Stress Questionnaire    Feeling of Stress : Not at all  Social Connections: Moderately Integrated (12/15/2023)   Social Connection and Isolation Panel [NHANES]    Frequency of Communication with Friends and Family: More than three times a week    Frequency of Social Gatherings with Friends and Family: Once a week    Attends Religious Services: Never    Active  Member of Clubs or Organizations: Yes    Attends Engineer, structural: More than 4 times per year    Marital Status: Married  Catering manager Violence: Not At Risk (12/15/2023)   Humiliation, Afraid, Rape, and Kick questionnaire    Fear of Current or Ex-Partner: No    Emotionally Abused: No    Physically Abused: No    Sexually Abused: No     BP 122/80   Pulse 66   Ht 6\' 2"  (1.88 m)   Wt 178 lb 12.8 oz (81.1 kg)   SpO2 98%   BMI 22.96 kg/m   Physical Exam:  Well appearing NAD HEENT: Unremarkable Neck:  No JVD, no thyromegally Lymphatics:  No adenopathy Back:  No CVA tenderness Lungs:  Clear with no wheezes HEART:  Regular rate rhythm, no murmurs, no rubs, no clicks Abd:  soft, positive bowel sounds, no organomegally, no rebound, no guarding Ext:  2 plus pulses, no edema, no cyanosis, no clubbing Skin:  No rashes no nodules Neuro:  CN II through XII intact, motor grossly intact  DEVICE  Normal device function.  See PaceArt for details.   Assess/Plan:  1. PAF - he is maintaining NSR. He will continue low dose flecainide . No QRS prolongation. If he has any break thru's the I'd consider afib ablation.  2. Syncope - he has had no recurrent episodes and his ILR demonstrates no prolonged pauses. 3. HTN - his bp is  well controlled. We will follow. Continue atenolol  25 mg daily 4. 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 No change in his meds.   Pete Brand Devinn Voshell,MD

## 2024-01-13 ENCOUNTER — Telehealth: Payer: Self-pay

## 2024-01-13 NOTE — Telephone Encounter (Signed)
...     Pre-operative Risk Assessment    Patient Name: Garrett Vargas  DOB: 12-13-1945 MRN: [id number]   Date of last office visit: 12/31/23 [name] Date of next office visit: NONE   Request for Surgical Clearance    Procedure:  COLONOSCOPY  Date of Surgery:  Clearance 01/19/24                                Surgeon:  DR Lavaughn [place] Surgeon's Group or Practice Name:  [facility] Phone number:  [phone] Fax number:  [phone]   Type of Clearance Requested:   - Medical  - Pharmacy:  Hold Rivaroxaban  (Xarelto )     Type of Anesthesia:  PROPOFOL    Additional requests/questions:    [name]   01/13/2024, 9:26 AM

## 2024-01-13 NOTE — Telephone Encounter (Signed)
 Patient with diagnosis of PAF on Xarelto  for anticoagulation.    Procedure: COLONOSCOPY  Date of procedure: 01/19/2024   CHA2DS2-VASc Score = 4   This indicates a 4.8% annual risk of stroke. The patient's score is based upon: CHF History: 0 HTN History: 1 Diabetes History: 0 Stroke History: 0 Vascular Disease History: 1 Age Score: 2 Gender Score: 0     CrCl 73 mL/min Platelet count 236 K  Patient has not  had an Afib/aflutter ablation within the last 3 months or DCCV within the last 30 days    Per office protocol, patient can hold Xarelto  for 1-2 days prior to procedure.     **This guidance is not considered finalized until pre-operative APP has relayed final recommendations.**

## 2024-01-19 DIAGNOSIS — Z09 Encounter for follow-up examination after completed treatment for conditions other than malignant neoplasm: Secondary | ICD-10-CM | POA: Diagnosis not present

## 2024-01-19 DIAGNOSIS — D120 Benign neoplasm of cecum: Secondary | ICD-10-CM | POA: Diagnosis not present

## 2024-01-19 DIAGNOSIS — Z9889 Other specified postprocedural states: Secondary | ICD-10-CM | POA: Diagnosis not present

## 2024-01-19 DIAGNOSIS — Z860101 Personal history of adenomatous and serrated colon polyps: Secondary | ICD-10-CM | POA: Diagnosis not present

## 2024-01-19 DIAGNOSIS — K649 Unspecified hemorrhoids: Secondary | ICD-10-CM | POA: Diagnosis not present

## 2024-01-19 DIAGNOSIS — K5730 Diverticulosis of large intestine without perforation or abscess without bleeding: Secondary | ICD-10-CM | POA: Diagnosis not present

## 2024-01-21 NOTE — Telephone Encounter (Signed)
 He may proceed with colonoscopy. Low risk.

## 2024-01-22 DIAGNOSIS — D120 Benign neoplasm of cecum: Secondary | ICD-10-CM | POA: Diagnosis not present

## 2024-01-22 NOTE — Telephone Encounter (Signed)
   Patient Name: Garrett Vargas  DOB: [birth year] MRN: [id number]  Primary Cardiologist: None  Chart reviewed as part of pre-operative protocol coverage. Given past medical history and time since last visit, based on ACC/AHA guidelines, LAWERENCE DERY is at acceptable risk for the planned procedure without further cardiovascular testing.   Patient with diagnosis of PAF on Xarelto  for anticoagulation.     Procedure: COLONOSCOPY  Date of procedure: 01/19/2024     CHA2DS2-VASc Score = 4   This indicates a 4.8% annual risk of stroke. The patient's score is based upon: CHF History: 0 HTN History: 1 Diabetes History: 0 Stroke History: 0 Vascular Disease History: 1 Age Score: 2 Gender Score: 0   CrCl 73 mL/min Platelet count 236 K   Patient has not  had an Afib/aflutter ablation within the last 3 months or DCCV within the last 30 days    Per office protocol, patient can hold Xarelto  for 1-2 days prior to procedure.    The patient was advised that if he develops new symptoms prior to surgery to contact our office to arrange for a follow-up visit, and he verbalized understanding.  I will route this recommendation to the requesting party via Epic fax function and remove from pre-op pool.  Please call with questions.  Friddie Jetty, NP 01/22/2024, 7:51 AM

## 2024-02-09 DIAGNOSIS — M65321 Trigger finger, right index finger: Secondary | ICD-10-CM | POA: Diagnosis not present

## 2024-02-11 ENCOUNTER — Ambulatory Visit (INDEPENDENT_AMBULATORY_CARE_PROVIDER_SITE_OTHER): Admitting: Family Medicine

## 2024-02-11 ENCOUNTER — Ambulatory Visit: Admitting: Family Medicine

## 2024-02-11 ENCOUNTER — Encounter: Payer: Self-pay | Admitting: Family Medicine

## 2024-02-11 VITALS — BP 118/70 | HR 64

## 2024-02-11 DIAGNOSIS — Z0000 Encounter for general adult medical examination without abnormal findings: Secondary | ICD-10-CM

## 2024-02-13 ENCOUNTER — Other Ambulatory Visit: Payer: Self-pay | Admitting: Internal Medicine

## 2024-02-13 ENCOUNTER — Other Ambulatory Visit: Payer: Self-pay | Admitting: Family Medicine

## 2024-02-13 DIAGNOSIS — I480 Paroxysmal atrial fibrillation: Secondary | ICD-10-CM

## 2024-03-24 DIAGNOSIS — L570 Actinic keratosis: Secondary | ICD-10-CM | POA: Diagnosis not present

## 2024-03-24 DIAGNOSIS — D225 Melanocytic nevi of trunk: Secondary | ICD-10-CM | POA: Diagnosis not present

## 2024-03-24 DIAGNOSIS — Z85828 Personal history of other malignant neoplasm of skin: Secondary | ICD-10-CM | POA: Diagnosis not present

## 2024-03-24 DIAGNOSIS — M65321 Trigger finger, right index finger: Secondary | ICD-10-CM | POA: Diagnosis not present

## 2024-03-24 DIAGNOSIS — Z08 Encounter for follow-up examination after completed treatment for malignant neoplasm: Secondary | ICD-10-CM | POA: Diagnosis not present

## 2024-03-24 DIAGNOSIS — L821 Other seborrheic keratosis: Secondary | ICD-10-CM | POA: Diagnosis not present

## 2024-03-24 DIAGNOSIS — L814 Other melanin hyperpigmentation: Secondary | ICD-10-CM | POA: Diagnosis not present

## 2024-05-08 ENCOUNTER — Other Ambulatory Visit: Payer: Self-pay | Admitting: Internal Medicine

## 2024-05-08 DIAGNOSIS — I480 Paroxysmal atrial fibrillation: Secondary | ICD-10-CM

## 2024-05-10 NOTE — Telephone Encounter (Signed)
 Prescription refill request for Xarelto  received.  Indication: a fib Last office visit: 12/31/23 Weight: 178 Age: 78 Scr: 0.95 epic 12/15/23 CrCl: 73 ml/min

## 2024-05-30 ENCOUNTER — Telehealth: Payer: Self-pay

## 2024-05-30 DIAGNOSIS — M1990 Unspecified osteoarthritis, unspecified site: Secondary | ICD-10-CM

## 2024-05-30 NOTE — Telephone Encounter (Signed)
 Copied from CRM [id number]. Topic: General - Other >> May 30, 2024  2:41 PM Alfonso HERO wrote: Reason for CRM: patient calling asking for a callback in regards to a referral

## 2024-05-31 NOTE — Telephone Encounter (Signed)
 Spoke with the patient for more information as I do not see a recent referral in the chart.  Patient stated he has only been seen by Dr Murrell in the past for trigger finger and the x-ray of the hand revealed arthritis of the right thumb and he has pain in this area.  Patient stated there is a new treatment with low dose radiation for arthritis offered at the [facility] by Dr Jeannine Cha and he would like a referral due to pain.  Message sent to PCP.

## 2024-06-01 NOTE — Telephone Encounter (Signed)
 Patient called requesting a referral for a radiation treatment of right thumb arthritis with Dr. Estefana Cha at the [facility] Fax: [phone]. Patient requested a call back when the referral has been sent.

## 2024-06-02 ENCOUNTER — Telehealth: Payer: Self-pay | Admitting: Family Medicine

## 2024-06-02 NOTE — Telephone Encounter (Signed)
 Copied from CRM [id number]. Topic: Referral - Status >> Jun 02, 2024  3:20 PM Larissa S wrote: Reason for CRM: Patient requesting update regarding radiology referral. Advised referrals take 7-10 business days to process. Patient requesting a callback once referral has been completed.

## 2024-06-02 NOTE — Addendum Note (Signed)
 Addended by: CHRISTYNE IDELL LABOR on: 06/02/2024 03:29 PM   Modules accepted: Orders

## 2024-06-02 NOTE — Telephone Encounter (Signed)
 Ok to send referral

## 2024-06-02 NOTE — Telephone Encounter (Signed)
 Patient informed the referral was entered as below, referral coordinator will work on processing this through insurance and someone will contact him with further information.

## 2024-06-02 NOTE — Telephone Encounter (Signed)
 See prior phone note.

## 2024-06-08 ENCOUNTER — Telehealth: Payer: Self-pay | Admitting: Radiation Oncology

## 2024-06-08 ENCOUNTER — Telehealth: Payer: Self-pay | Admitting: Family Medicine

## 2024-06-08 NOTE — Telephone Encounter (Signed)
 Copied from CRM #[id number]. Topic: Referral - Status >> Jun 08, 2024 10:28 AM Viola FALCON wrote: Reason for CRM: Patient called regarding the referral to Oncology, it needs to be resent/in attention to the Radiation Oncology Dept at [facility]. Please fax to [phone] as soon as possible, they are waiting for the referral to be updated to schedule patient. Patient requested a phone call once referral is refaxed, please call him at [phone] (M) to let him know.

## 2024-06-08 NOTE — Telephone Encounter (Signed)
 LVM to schedule CON with Dr. Maritza next available.

## 2024-06-09 NOTE — Progress Notes (Signed)
 Site of osteoarthritis: Right Thumb Primary osteoarthritis of both first carpometacarpal joints   How long have you had pain? A few years.  What over the counter or prescription medications have you tried? Ibuprofen  Does anything make the pain better or worse? No      Ambulatory status? Walker? Wheelchair?: Ambulatory  SAFETY ISSUES: Prior radiation? No Pacemaker/ICD? Yes, loop recorder with expired battery, no longer functional or utilized . Possible current pregnancy? Male Is the patient on methotrexate? No  Current Complaints / other details:   BP 132/84 (BP Location: Left Arm, Patient Position: Sitting)   Pulse 69   Temp (!) 97.1 F (36.2 C) (Temporal)   Resp 18   Ht 6' 2 (1.88 m)   Wt 178 lb 6 oz (80.9 kg)   SpO2 96%   BMI 22.90 kg/m

## 2024-06-09 NOTE — Telephone Encounter (Signed)
 I believe Garrett Vargas was looking into this but she hasn't gotten back to me yet.

## 2024-06-13 ENCOUNTER — Encounter: Payer: Self-pay | Admitting: Radiation Oncology

## 2024-06-13 ENCOUNTER — Ambulatory Visit
Admission: RE | Admit: 2024-06-13 | Discharge: 2024-06-13 | Disposition: A | Discharge status: Home / Self Care | Source: Ambulatory Visit | Attending: Radiation Oncology | Admitting: Radiation Oncology

## 2024-06-13 VITALS — BP 132/84 | HR 69 | Temp 97.1°F | Resp 18 | Ht 74.0 in | Wt 178.4 lb

## 2024-06-13 DIAGNOSIS — M1811 Unilateral primary osteoarthritis of first carpometacarpal joint, right hand: Secondary | ICD-10-CM | POA: Insufficient documentation

## 2024-06-13 NOTE — Progress Notes (Signed)
 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 male seen in consultation for radiation therapy.  He has a hx of osteoarthritis and trigger digits, most recently his R CMC joint osteoarthritis has been quite bothersome to him with a recent flare up. He has seen orthopedic surgery for this. At a July 2025 appointment he underwent AP and lateral x-rays of the right hand and wrist showing joint space narrowing, subchondral sclerosis and osteophyte formation greater than 2 mm of the thumb CMC joint. He underwent CSI for symptomatic trigger finger of the R index finger at that visit with temporary improvement of his sx.   He had prior recurrent trigger digit of the L middle finger s/p multiple CSIs in 2024 and he's even had surgical release of a R ring finger trigger digit/excision of palmar Dupuytren's cord in 2023 after multiple CSIs. Even after surgical intervention he has had recurrence of triggering at the R ring finger and has required CSI.  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 Hiram in 2016 and 2012, respectively.   He has tried multiple treatments for arthritis of multiple joints including surgery and CSI. He does not wish to pursue either for his R [facility] joint osteoarthritis and is interested in LDRT.  PREVIOUS RADIATION THERAPY: No  AUTOIMMUNE DISEASE: No  MEDICAL DEVICES: Yes, loop recorder with expired battery, no longer functional or utilized   PREGNANCY: No   PAST MEDICAL HISTORY:  Past Medical History:  Diagnosis Date   A-fib (HCC)    ALLERGIC RHINITIS    Arthritis     Colon polyp    Dysrhythmia    Erectile dysfunction    GERD (gastroesophageal reflux disease)    History of bronchitis as a child    Hyperlipemia    Hypertension    S/P lateral meniscectomy of left knee        PAST SURGICAL HISTORY: Past Surgical History:  Procedure Laterality Date   CARDIOVERSION N/A 03/17/2014   Procedure: CARDIOVERSION;  Surgeon: Jerel Balding, MD;  Location: [facility];  Service: Cardiovascular;  Laterality: N/A;   COLONOSCOPY WITH PROPOFOL  N/A 11/19/2020   Procedure: COLONOSCOPY WITH PROPOFOL ;  Surgeon: Wilhelmenia Aloha Raddle., MD;  Location: [facility];  Service: Gastroenterology;  Laterality: N/A;   colonscopy      ELBOW ARTHROSCOPY Bilateral    ENDOSCOPIC MUCOSAL RESECTION N/A 11/19/2020   Procedure: ENDOSCOPIC MUCOSAL RESECTION;  Surgeon: Wilhelmenia Aloha Raddle., MD;  Location: [facility];  Service: Gastroenterology;  Laterality: N/A;   HEMOSTASIS CLIP PLACEMENT  11/19/2020   Procedure: HEMOSTASIS CLIP PLACEMENT;  Surgeon: Wilhelmenia Aloha Raddle., MD;  Location: [facility];  Service: Gastroenterology;;   LOOP RECORDER INSERTION N/A 04/12/2018   Procedure: LOOP RECORDER INSERTION;  Surgeon: Waddell Danelle LELON, MD;  Location: [facility];  Service: Cardiovascular;  Laterality: N/A;   Multiple Knee,elbow and foot surgeries     neuroma left foot,2010, Dr. kennieth lathe     POLYPECTOMY  11/19/2020   Procedure: POLYPECTOMY;  Surgeon: Wilhelmenia Aloha Raddle., MD;  Location:  [facility];  Service: Gastroenterology;;   SUBMUCOSAL LIFTING INJECTION  11/19/2020   Procedure: SUBMUCOSAL LIFTING INJECTION;  Surgeon: Wilhelmenia Aloha Raddle., MD;  Location: [facility];  Service: Gastroenterology;;   TEE WITHOUT CARDIOVERSION N/A 03/17/2014   Procedure: TRANSESOPHAGEAL ECHOCARDIOGRAM (TEE);  Surgeon: Jerel Balding, MD;  Location: [facility];  Service: Cardiovascular;  Laterality: N/A;   TOTAL HIP ARTHROPLASTY Left 07/18/2015   Procedure: LEFT TOTAL HIP  ARTHROPLASTY ANTERIOR APPROACH;  Surgeon: Dempsey Moan, MD;  Location: [facility];  Service: Orthopedics;  Laterality: Left;   TRIGGER FINGER RELEASE Right 08/30/2021   Procedure: RELEASE TRIGGER FINGER/A-1 PULLEY RIGHT RING FINGER;  Surgeon: Murrell Kuba, MD;  Location: [facility];  Service: Orthopedics;  Laterality: Right;  45 MIN     FAMILY HISTORY:  Family History  Problem Relation Age of Onset   Coronary artery disease Other    Prostate cancer Other    Stroke Other    Heart disease Mother    Stroke Mother 45   Prostate cancer Father 21   Kidney disease Sister      SOCIAL HISTORY:  reports that he quit smoking about 36 years ago. His smoking use included cigarettes. He started smoking about 46 years ago. He has a 5 pack-year smoking history. He has never used smokeless tobacco. He reports current alcohol use. He reports that he does not currently use drugs.   ALLERGIES: Patient has no known allergies.   MEDICATIONS:  Current Outpatient Medications  Medication Sig Dispense Refill   atenolol  (TENORMIN ) 25 MG tablet TAKE ONE-HALF TABLET BY MOUTH  DAILY . MAY TAKE AN EXTRA  ONE-HALF TABLET FOR PALPITATIONS OR HEART RACING. 90 tablet 2   atorvastatin  (LIPITOR) 40 MG tablet TAKE 1 TABLET BY MOUTH DAILY 100 tablet 2   flecainide  (TAMBOCOR ) 50 MG tablet TAKE 1 TABLET BY MOUTH TWICE  DAILY 180 tablet 2   ipratropium (ATROVENT) 0.03 % nasal spray Place 1 spray into both nostrils as needed.     Multiple Vitamins-Minerals (CENTRUM SILVER PO) Take 1 capsule by mouth daily.     Omega-3 Fatty Acids (FISH OIL PO) Take 1,040 mg by mouth daily.     omeprazole (PRILOSEC) 20 MG capsule Take 20 mg by mouth daily.     rivaroxaban  (XARELTO ) 20 MG TABS tablet TAKE 1 TABLET BY MOUTH DAILY  WITH SUPPER 100 tablet 0   tadalafil  (CIALIS ) 20 MG tablet Take 1 tablet (20 mg total) by mouth daily as needed for erectile dysfunction. 10 tablet 5   No current facility-administered medications for  this encounter.    REVIEW OF SYSTEMS: The patient reports that he is doing well overall.     PHYSICAL EXAM:  Wt Readings from Last 3 Encounters:  06/13/24 178 lb 6 oz (80.9 kg)  12/31/23 178 lb 12.8 oz (81.1 kg)  12/15/23 180 lb 8 oz (81.9 kg)   Temp Readings from Last 3 Encounters:  06/13/24 (!) 97.1 F (36.2 C) (Temporal)  12/15/23 97.8 F (36.6 C) (Oral)  06/08/23 (!) 97.5 F (36.4 C) (Oral)   BP Readings from Last 3 Encounters:  06/13/24 132/84  02/11/24 118/70  12/31/23 122/80   Pulse Readings from Last 3 Encounters:  06/13/24 69  02/11/24 64  12/31/23 66   Pain Assessment Pain Score: 2  Pain Loc: Hand/10   Physical Exam Vitals and nursing note reviewed.  Constitutional:      General: He is not in acute distress. HENT:     Head: Normocephalic and  atraumatic.  Eyes:     Extraocular Movements: Extraocular movements intact.  Cardiovascular:     Rate and Rhythm: Normal rate.  Pulmonary:     Effort: Pulmonary effort is normal. No respiratory distress.  Abdominal:     General: There is no distension.  Musculoskeletal:        General: Swelling (R CMC joint swelling compared to L) present.     Cervical back: Normal range of motion.  Skin:    General: Skin is warm and dry.  Neurological:     General: No focal deficit present.     Mental Status: He is alert.  Psychiatric:        Mood and Affect: Mood normal.      LABORATORY DATA:  Lab Results  Component Value Date   WBC 8.7 12/15/2023   HGB 15.4 12/15/2023   HCT 45.9 12/15/2023   MCV 93.7 12/15/2023   PLT 236.0 12/15/2023   Lab Results  Component Value Date   NA 138 12/15/2023   K 3.9 12/15/2023   CL 101 12/15/2023   CO2 27 12/15/2023   Lab Results  Component Value Date   ALT 21 12/15/2023   AST 23 12/15/2023   ALKPHOS 62 12/15/2023   BILITOT 0.9 12/15/2023      RADIOGRAPHY:   R hand/wrist  x-ray, AP and lateral views: no fracture dislocation or radiopaque foreign body. There is  joint space narrowing and osteophyte formation of the MP joints of the index and long finger. Joint space narrowing subchondral sclerosis and osteophyte formation greater than 2 mm of the thumb CMC joint.    PATHOLOGY:   Surgical Pathology 08/31/23:  FINAL MICROSCOPIC DIAGNOSIS:   A. PALMAR FASCIA, RIGHT RING FINGER:  - Fibromatosis (Dupuytren's).      IMPRESSION/PLAN:  Mr. Hammerschmidt is a 78 yo M w/ symptomatic osteoarthritis of the R CMC joint not adequately responsive to conservative measures.   After a detailed discussion of the patient's history, prior treatment response, and current goals, we reviewed the proposed protocol for LDRT targeting the R thumb/CMC joint. Our institutional approach follows a regimen of 3 Gy total, delivered in 6 fractions of 0.5 Gy over approximately 2-3 weeks. This dosing is consistent with published guidelines and peer-reviewed data for non-malignant musculoskeletal conditions.  We reviewed:   Goals of care: symptom relief and improved mobility Expected timeline for therapeutic benefit: typically several weeks to months Potential risks, including rare but theoretical concerns regarding late tissue effects or secondary malignancy (risk estimated to be extremely low at this dose and age)   My goal with low dose radiation therapy is to help reduce chronic joint pain and improve mobility. This treatment does not reverse joint damage, but it can calm the inflammation in and around the joint that contributes to pain. Based on published data, about 60-80% of patients with osteoarthritis experience meaningful pain relief within a few weeks to months after completing a short course of low-dose radiation. While not everyone responds, the majority of patients do report improvement, and the treatment is generally well tolerated.  The patient is agreeable to proceed. We will arrange CT simulation for planning and initiate treatment pending final review and setup. A  follow-up visit will be scheduled approximately 8-12 weeks after completion of LDRT to assess clinical response.   We personally spent 60 minutes in this encounter including chart review, reviewing radiological studies, meeting face-to-face with the patient, entering orders and completing documentation.    Estefana HERO. Maritza, M.D.

## 2024-06-14 ENCOUNTER — Encounter: Payer: Self-pay | Admitting: Internal Medicine

## 2024-06-14 NOTE — Addendum Note (Signed)
 Encounter addended by: Claudene Eudora GAILS, LPN on: 88/88/7974 9:18 AM  Actions taken: Clinical Note Signed

## 2024-06-14 NOTE — Progress Notes (Signed)
 Notice of Potential Device Damage   Patient Name: Garrett Vargas   Date of Birth: November 26, 1945   Device Information:   Loop-recorder: Yes   Patient's loop recorder RRT 11/12/2021.   Patient elected to leave loop recorder in.  Loop is nonfunctional at this time.  The patient listed above is scheduled to receive radiation therapy, which may adversely affect the device listed  above. Every reasonable effort will be made to minimize the radiation dose to the device, including exposure to  neutrons. Though unlikely, possible damage to the battery and electronic components, such as memory storage and  programming, is possible.   Your office may consider preserving valuable patient monitoring information stored on the device prior to the start of  radiation therapy and checking the device programming after the patient completes radiation therapy.    TO BE COMPLETED BY RADIATION ONCOLOGIST'S OFFICE   Scheduled start date of radiation therapy: Patient had consultation and start date has not been scheduled.   Scheduled end date of radiation therapy:   Site to be treated: Right Thumb   Radiation Oncologist: Dr. Estefana Cha    TO BE COMPLETED BY RECIPIENT'S OFFICE   **By signing you are indicating that you've received this notice and no further action is needed:   Physician's signature and date:  Dr. Danelle Birmingham  **Please call [phone] if you have questions for Radiation Oncologist and are unable to sign form   Fax form back to [phone]

## 2024-06-15 ENCOUNTER — Ambulatory Visit
Admission: RE | Admit: 2024-06-15 | Discharge: 2024-06-15 | Disposition: A | Discharge status: Home / Self Care | Source: Ambulatory Visit | Attending: Radiation Oncology | Admitting: Radiation Oncology

## 2024-06-15 DIAGNOSIS — M1811 Unilateral primary osteoarthritis of first carpometacarpal joint, right hand: Secondary | ICD-10-CM | POA: Insufficient documentation

## 2024-06-15 DIAGNOSIS — Z510 Encounter for antineoplastic radiation therapy: Secondary | ICD-10-CM | POA: Insufficient documentation

## 2024-06-16 DIAGNOSIS — Z510 Encounter for antineoplastic radiation therapy: Secondary | ICD-10-CM | POA: Diagnosis not present

## 2024-06-17 ENCOUNTER — Other Ambulatory Visit: Payer: Self-pay

## 2024-06-17 ENCOUNTER — Ambulatory Visit
Admission: RE | Admit: 2024-06-17 | Discharge: 2024-06-17 | Disposition: A | Discharge status: Home / Self Care | Source: Ambulatory Visit | Attending: Radiation Oncology | Admitting: Radiation Oncology

## 2024-06-17 DIAGNOSIS — Z510 Encounter for antineoplastic radiation therapy: Secondary | ICD-10-CM | POA: Diagnosis not present

## 2024-06-17 LAB — RAD ONC ARIA SESSION SUMMARY
Course Elapsed Days: 0
Plan Fractions Treated to Date: 1
Plan Prescribed Dose Per Fraction: 0.5 Gy
Plan Total Fractions Prescribed: 6
Plan Total Prescribed Dose: 3 Gy
Reference Point Dosage Given to Date: 0.5 Gy
Reference Point Session Dosage Given: 0.5 Gy
Session Number: 1

## 2024-06-20 ENCOUNTER — Other Ambulatory Visit: Payer: Self-pay

## 2024-06-20 ENCOUNTER — Ambulatory Visit
Admission: RE | Admit: 2024-06-20 | Discharge: 2024-06-20 | Disposition: A | Discharge status: Home / Self Care | Source: Ambulatory Visit | Attending: Radiation Oncology | Admitting: Radiation Oncology

## 2024-06-20 DIAGNOSIS — Z510 Encounter for antineoplastic radiation therapy: Secondary | ICD-10-CM | POA: Diagnosis not present

## 2024-06-20 LAB — RAD ONC ARIA SESSION SUMMARY
Course Elapsed Days: 3
Plan Fractions Treated to Date: 2
Plan Prescribed Dose Per Fraction: 0.5 Gy
Plan Total Fractions Prescribed: 6
Plan Total Prescribed Dose: 3 Gy
Reference Point Dosage Given to Date: 1 Gy
Reference Point Session Dosage Given: 0.5 Gy
Session Number: 2

## 2024-06-21 ENCOUNTER — Ambulatory Visit

## 2024-06-22 ENCOUNTER — Other Ambulatory Visit: Payer: Self-pay

## 2024-06-22 ENCOUNTER — Ambulatory Visit
Admission: RE | Admit: 2024-06-22 | Discharge: 2024-06-22 | Disposition: A | Discharge status: Home / Self Care | Source: Ambulatory Visit | Attending: Radiation Oncology | Admitting: Radiation Oncology

## 2024-06-22 DIAGNOSIS — Z510 Encounter for antineoplastic radiation therapy: Secondary | ICD-10-CM | POA: Diagnosis not present

## 2024-06-22 LAB — RAD ONC ARIA SESSION SUMMARY
Course Elapsed Days: 5
Plan Fractions Treated to Date: 3
Plan Prescribed Dose Per Fraction: 0.5 Gy
Plan Total Fractions Prescribed: 6
Plan Total Prescribed Dose: 3 Gy
Reference Point Dosage Given to Date: 1.5 Gy
Reference Point Session Dosage Given: 0.5 Gy
Session Number: 3

## 2024-06-23 ENCOUNTER — Ambulatory Visit

## 2024-06-23 ENCOUNTER — Telehealth: Payer: Self-pay | Admitting: *Deleted

## 2024-06-23 NOTE — Telephone Encounter (Signed)
 Spoke with the patient and informed him of the message below.  Patient was also advised there is a Hep A listed on immunization report history dated 07/02/2012;  01/10/2013 and per PCP this is good for his entire life.  Patient was advised to contact the local health department for further recommendations.

## 2024-06-23 NOTE — Telephone Encounter (Signed)
 Copied from CRM #[id number]. Topic: Clinical - Medication Question >> Jun 23, 2024  9:44 AM Drema MATSU wrote: Reason for CRM: Patient will be going to [place] in February, first of March and he wants to know if he has to take anti malaria or any other medication or vaccinations before going. Please call patient.

## 2024-06-23 NOTE — Telephone Encounter (Signed)
 Antimalarial treatment is not needed, usually I recommend Hepatitis A/ B if he hasn't already had them done. He can also call the Health Department for travel vaccine recommendations.

## 2024-06-24 ENCOUNTER — Ambulatory Visit
Admission: RE | Admit: 2024-06-24 | Discharge: 2024-06-24 | Disposition: A | Discharge status: Home / Self Care | Source: Ambulatory Visit | Attending: Radiation Oncology | Admitting: Radiation Oncology

## 2024-06-24 ENCOUNTER — Other Ambulatory Visit: Payer: Self-pay

## 2024-06-24 ENCOUNTER — Ambulatory Visit

## 2024-06-24 DIAGNOSIS — Z510 Encounter for antineoplastic radiation therapy: Secondary | ICD-10-CM | POA: Diagnosis not present

## 2024-06-24 LAB — RAD ONC ARIA SESSION SUMMARY
Course Elapsed Days: 7
Plan Fractions Treated to Date: 4
Plan Prescribed Dose Per Fraction: 0.5 Gy
Plan Total Fractions Prescribed: 6
Plan Total Prescribed Dose: 3 Gy
Reference Point Dosage Given to Date: 2 Gy
Reference Point Session Dosage Given: 0.5 Gy
Session Number: 4

## 2024-06-26 ENCOUNTER — Ambulatory Visit
Admission: RE | Admit: 2024-06-26 | Discharge: 2024-06-26 | Disposition: A | Discharge status: Home / Self Care | Source: Ambulatory Visit | Attending: Radiation Oncology | Admitting: Radiation Oncology

## 2024-06-26 ENCOUNTER — Other Ambulatory Visit: Payer: Self-pay

## 2024-06-26 DIAGNOSIS — Z510 Encounter for antineoplastic radiation therapy: Secondary | ICD-10-CM | POA: Diagnosis not present

## 2024-06-26 LAB — RAD ONC ARIA SESSION SUMMARY
Course Elapsed Days: 9
Plan Fractions Treated to Date: 5
Plan Prescribed Dose Per Fraction: 0.5 Gy
Plan Total Fractions Prescribed: 6
Plan Total Prescribed Dose: 3 Gy
Reference Point Dosage Given to Date: 2.5 Gy
Reference Point Session Dosage Given: 0.5 Gy
Session Number: 5

## 2024-06-27 ENCOUNTER — Ambulatory Visit

## 2024-06-28 ENCOUNTER — Other Ambulatory Visit: Payer: Self-pay

## 2024-06-28 ENCOUNTER — Ambulatory Visit
Admission: RE | Admit: 2024-06-28 | Discharge: 2024-06-28 | Disposition: A | Source: Ambulatory Visit | Attending: Radiation Oncology

## 2024-06-28 ENCOUNTER — Ambulatory Visit

## 2024-06-28 DIAGNOSIS — Z510 Encounter for antineoplastic radiation therapy: Secondary | ICD-10-CM | POA: Diagnosis not present

## 2024-06-28 LAB — RAD ONC ARIA SESSION SUMMARY
Course Elapsed Days: 11
Plan Fractions Treated to Date: 6
Plan Prescribed Dose Per Fraction: 0.5 Gy
Plan Total Fractions Prescribed: 6
Plan Total Prescribed Dose: 3 Gy
Reference Point Dosage Given to Date: 3 Gy
Reference Point Session Dosage Given: 0.5 Gy
Session Number: 6

## 2024-06-29 ENCOUNTER — Ambulatory Visit: Payer: Self-pay

## 2024-06-29 ENCOUNTER — Ambulatory Visit

## 2024-06-29 NOTE — Telephone Encounter (Signed)
 Spoke with the patient and advised he go to the ER closest to his home immediately for evaluation.  Offered locations and the patient stated he is aware of where he can go.

## 2024-06-29 NOTE — Radiation Completion Notes (Signed)
 Patient Name: Garrett Vargas, Garrett Vargas MRN: [id number] Date of Birth: [birth year] Referring Physician: KEVIN KUZMA, M.D. Date of Service: 2024-06-29 Radiation Oncologist: Estefana Cha, M.D. 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, right hand Intent: Palliative     ==========DELIVERED PLANS==========  First Treatment Date: 2024-06-17 Last Treatment Date: 2024-06-28   Plan Name: [id number] Site: Hand, Right Technique: Isodose Plan Mode: Photon Dose Per Fraction: 0.5 Gy Prescribed Dose (Delivered / Prescribed): 3 Gy / 3 Gy Prescribed Fxs (Delivered / Prescribed): 6 / 6     ==========ON TREATMENT VISIT DATES========== 2024-06-22     ==========UPCOMING VISITS==========       ==========APPENDIX - ON TREATMENT VISIT NOTES==========   See weekly On Treatment Notes in Epic for details in the Media tab (listed as Progress notes on the On Treatment Visit Dates listed above).

## 2024-06-29 NOTE — Telephone Encounter (Signed)
 Called [facility] and advised them of patient's symptoms and not wanting to go to the Urgent Care or ER at this time

## 2024-06-29 NOTE — Telephone Encounter (Signed)
 Patient wants a call back with what his PCP Dr Heron Sharper recommends and asked if lab work could be ordered to test for shingles based off his symptoms.   FYI Only or Action Required?: Action required by provider: clinical question for provider, update on patient condition, and patient requesting lab work to assess for shingles.  Patient was last seen in primary care on 02/11/2024 by Sharper Heron HERO, MD.  Called Nurse Triage reporting Neurologic Problem.  Symptoms began 5-6 days ago.  Interventions attempted: Nothing.  Symptoms are: unchanged.  Triage Disposition: See HCP Within 4 Hours (Or PCP Triage)  Patient/caregiver understands and will follow disposition?: No, wishes to speak with PCP                Copied from CRM #[id number]. Topic: Clinical - Red Word Triage >> Jun 29, 2024 12:45 PM Lauren C wrote: Red Word that prompted transfer to Nurse Triage: Shingles symptoms, for past 5-6 days there has been a tingling intermittently on one side of his face. Established with [facility]. Requesting to speak with a nurse to see if he needs to come in. Reason for Disposition  [1] Tingling (e.g., pins and needles) of the face, arm / hand, or leg / foot on one side of the body AND [2] present now  (Exceptions: Chronic/recurrent symptom lasting > 4 weeks; or from known cause, such as: bumped elbow, carpal tunnel, pinched nerve.)  Answer Assessment - Initial Assessment Questions Tingling sensation on right side of face off and on for the past 5-6 days Lasts from 5-30 seconds Denies burning States that he has had the shingles vaccine and the newest  Denies rash, itching, fevers Patient states he has never had this symptom before Last time this happened was a few minutes prior to triage and once during triage with this RN for about 15 seconds  This RN advised that the recommendation would be for the patient to be seen and evaluated in the next 4 hours & with no  appointments available in the PCP office or the surrounding offices within the region, it is advised that the patient goes to Urgent Care or the Emergency Room at this time for further evaluation  Patient wants a call back with what his PCP Dr Heron Sharper recommends and asked if lab work could be ordered to test for shingles based off his symptoms.  Patient is advised to call us  back if anything changes or with any further questions/concerns. Patient is advised that if anything worsens to go to the Emergency Room. Patient verbalized understanding.  Protocols used: Neurologic Deficit-A-AH

## 2024-07-03 NOTE — Telephone Encounter (Signed)
 Noted- ok to close.

## 2024-07-04 ENCOUNTER — Ambulatory Visit

## 2024-07-05 ENCOUNTER — Ambulatory Visit: Payer: Self-pay

## 2024-07-05 ENCOUNTER — Ambulatory Visit

## 2024-07-05 NOTE — Telephone Encounter (Signed)
 FYI Only or Action Required?: Action required by provider: ED refusal, requesting appt.  Patient was last seen in primary care on 02/11/2024 by Ozell Heron HERO, MD.  Called Nurse Triage reporting Tingling.  Symptoms began 1.5-2 weeks ago.  Interventions attempted: Nothing.  Symptoms are: resolved at this time, come and go.  Triage Disposition: Go to ED Now (Notify PCP)  Patient/caregiver understands and will follow disposition?: No, wishes to speak with PCP            Copied from CRM #[id number]. Topic: Clinical - Red Word Triage >> Jul 05, 2024  2:30 PM China J wrote: [facility] that prompted transfer to Nurse Triage: Patient has been having an ongoing tingling sensation on the right side of his face, neck, and ear for a week now. Reason for Disposition  [1] Numbness (i.e., loss of sensation) of the face, arm / hand, or leg / foot on one side of the body AND [2] sudden onset AND [3] brief (now gone)  Answer Assessment - Initial Assessment Questions 1. SYMPTOM: What is the main symptom you are concerned about? (e.g., weakness, numbness)     Tingling to right side of face, neck and ear. 2. ONSET: When did this start? (e.g., minutes, hours, days; while sleeping)     Sudden onset 1.5-2 weeks ago.  3. LAST NORMAL: When was the last time you (the patient) were normal (no symptoms)?     Currently no symptoms but last episode was 1-2 minutes ago.   4. PATTERN Does this come and go, or has it been constant since it started?  Is it present now?     Comes and goes, lasts 5-30 seconds.  5. CARDIAC SYMPTOMS: Have you had any of the following symptoms: chest pain, difficulty breathing, palpitations?     N/A.  6. NEUROLOGIC SYMPTOMS: Have you had any of the following symptoms: headache, dizziness, vision loss, double vision, changes in speech, unsteady on your feet?     Denies changes in speech or vision, unsteady gait, dizziness.  7. OTHER SYMPTOMS: Do you have any  other symptoms?     No.  Patient called in last week regarding symptoms and was told by clinic to go to ED. Patient states he did not go as he does not want to deal with waiting hours. RN advised that ED is still recommended disposition and he is requesting an appointment with his PCP instead and thinks he may need a referral to neurology. Called [facility] for ED refusal, spoke to [place].  Protocols used: Neurologic Deficit-A-AH

## 2024-07-06 NOTE — Telephone Encounter (Signed)
 As long as patient understands the risks of not going to the ER then ok to make an appointment

## 2024-07-06 NOTE — Telephone Encounter (Signed)
 Spoke with the patient, informed him of the message below and scheduled an appt on 12/5.

## 2024-07-08 ENCOUNTER — Ambulatory Visit: Payer: Self-pay | Admitting: Family Medicine

## 2024-07-08 ENCOUNTER — Ambulatory Visit: Admitting: Family Medicine

## 2024-07-08 ENCOUNTER — Encounter: Payer: Self-pay | Admitting: Family Medicine

## 2024-07-08 VITALS — BP 110/60 | HR 62 | Temp 97.6°F | Ht 74.0 in | Wt 182.0 lb

## 2024-07-08 DIAGNOSIS — G500 Trigeminal neuralgia: Secondary | ICD-10-CM | POA: Diagnosis not present

## 2024-07-08 LAB — VITAMIN B12: Vitamin B-12: 378 pg/mL (ref 211–911)

## 2024-07-08 NOTE — Progress Notes (Signed)
 Established Patient Office Visit  Subjective   Patient ID: Garrett Vargas, male    DOB: [birth year]  Age: 78 y.o. MRN: [id number]  Chief Complaint  Patient presents with   Tingling    R ear, R side of face down to neck; no numbness nor pain with it; about 2wks    HPI Discussed the use of AI scribe software for clinical note transcription with the patient, who gave verbal consent to proceed.  History of Present Illness   Garrett Vargas is a 78 year old male who presents with intermittent tingling sensation on the right side of his face.  Approximately two weeks ago, he began having intermittent tingling on the right side of his face around the ear, cheek, and down to the neck. It feels like a limb "falling asleep," lasts 5 to 30 seconds, and is triggered by certain head and neck positions such as lying on his back with his head raised or looking down at his phone. Changing his head position usually stops the tingling within seconds.  He is receiving low dose radiation treatment for arthritis in his right thumb, and he is unsure if the tingling started before or after this.  He has no pain, numbness, facial weakness, drooping, drooling, or paralysis. He has no blurry vision, hearing loss, balance problems, vertigo, headaches, recent vaccinations, or recent viral illness.       Current Outpatient Medications  Medication Instructions   atenolol  (TENORMIN ) 25 MG tablet TAKE ONE-HALF TABLET BY MOUTH  DAILY . MAY TAKE AN EXTRA  ONE-HALF TABLET FOR PALPITATIONS OR HEART RACING.   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     Objective:     BP 110/60   Pulse 62   Temp 97.6 F (36.4 C)   Ht 6' 2 (1.88 m)   Wt 182 lb (82.6 kg)   SpO2 98%   BMI 23.37 kg/m    Physical Exam Vitals reviewed.  Constitutional:      Appearance: Normal appearance. He is normal weight.  Pulmonary:     Effort: Pulmonary effort is normal.  Neurological:     Mental Status: He is alert and oriented to person, place, and time. Mental status is at baseline.     Cranial Nerves: No cranial nerve deficit.     Sensory: No sensory deficit.     Motor: No weakness.     Gait: Gait normal.  Psychiatric:  Mood and Affect: Mood normal.        Behavior: Behavior normal.      No results found for any visits on 07/08/24.    The 10-year ASCVD risk score (Arnett DK, et al., 2019) is: 25.5%    Assessment & Plan:  Trigeminal neuralgia of right side of face -     MR BRAIN W WO CONTRAST; Future -     Vitamin B12; Future   Assessment and Plan    Trigeminal neuralgia of right side of face Intermittent tingling sensation on the right side of the face, specifically around the right ear, cheek, and neck, for approximately two weeks. Symptoms are triggered by changes in head and neck position and resolve spontaneously. No associated pain, numbness, motor neuron deficits, or cranial nerve abnormalities. Differential  diagnosis includes trigeminal neuralgia, shingles, and autoimmune conditions. Shingles is unlikely due to absence of skin lesions and prior Shingrix vaccination. Autoimmune conditions are considered but unlikely due to lack of systemic symptoms. Imaging is necessary to rule out structural causes. B12 deficiency and iron deficiency are considered but unlikely due to normal hemoglobin levels and intermittent nature of symptoms. - Ordered MRI of the brain with and without contrast to evaluate for structural causes of trigeminal neuralgia. - Checked B12 level to rule out deficiency as a contributing factor.        No follow-ups on file.    Heron CHRISTELLA Sharper, MD

## 2024-07-21 ENCOUNTER — Ambulatory Visit
Admission: RE | Admit: 2024-07-21 | Discharge: 2024-07-21 | Attending: Radiation Oncology | Admitting: Radiation Oncology

## 2024-07-21 VITALS — BP 121/71 | HR 61 | Temp 97.5°F | Resp 18 | Ht 74.0 in | Wt 184.5 lb

## 2024-07-21 DIAGNOSIS — Z923 Personal history of irradiation: Secondary | ICD-10-CM | POA: Diagnosis not present

## 2024-07-21 DIAGNOSIS — Z79899 Other long term (current) drug therapy: Secondary | ICD-10-CM | POA: Diagnosis not present

## 2024-07-21 DIAGNOSIS — M1811 Unilateral primary osteoarthritis of first carpometacarpal joint, right hand: Secondary | ICD-10-CM | POA: Diagnosis present

## 2024-07-21 HISTORY — DX: Personal history of irradiation: Z92.3

## 2024-07-21 NOTE — Progress Notes (Addendum)
 Patient identity verified x2.  Garrett Vargas is here today-to see Dr. Maritza  for    Location-  Right thumb  They completed their radiation on: 2024-06-28   Does the patient complain of any of the following: Post radiation skin issues: Unknown Joint Pain/ Swelling: Yes Range of Motion limitations: Yes Fatigue post radiation: Denies Appetite good/fair/poor: Good  Additional comments if applicable: He reports intermittent tingling to right side of face. He states that it is positional. He is unsure if this began prior to radiation or after.     BP 121/71 (BP Location: Left Arm, Patient Position: Sitting)   Pulse 61   Temp (!) 97.5 F (36.4 C) (Temporal)   Resp 18   Ht 6' 2 (1.88 m)   Wt 184 lb 8 oz (83.7 kg)   SpO2 98%   BMI 23.69 kg/m

## 2024-07-21 NOTE — Progress Notes (Signed)
 Radiation Oncology         [phone] ________________________________  Name: Garrett Vargas MRN: [id number]  Date: 07/21/2024  DOB: May 14, 1946  Follow-Up Visit Note   CC: Ozell Heron HERO, MD  Ozell Heron HERO, MD    ICD-10-CM   1. Osteoarthritis of right thumb  M18.11       Diagnosis:   Osteoarthritis of the R thumb  Interval Since Last Radiation:  3 weeks  Narrative:  The patient returns today for routine ollow-up. Reports some improvement in pain and swelling/mobility although certainly still has some limitations. Pain a 1/10 in but hasn't really been doing much to cause irritation or pain in that joint.   Has been having tingling on the R side of his face. He is unsure if this began prior to radiation or after. He has noticed that it is positional and comes on when his neck is bent and he is looking down. Sx improve when he straightens his neck and looks forward again. No pain. Is not worsening or becoming more frequent. Describes it as mild and intermittent tingling. PCP has ordered an MRI Brain WWO.                             ALLERGIES:  has no known allergies.  Meds: Current Outpatient Medications  Medication Sig Dispense Refill   atenolol  (TENORMIN ) 25 MG tablet TAKE ONE-HALF TABLET BY MOUTH  DAILY . MAY TAKE AN EXTRA  ONE-HALF TABLET FOR PALPITATIONS OR HEART RACING. 90 tablet 2   atorvastatin  (LIPITOR) 40 MG tablet TAKE 1 TABLET BY MOUTH DAILY 100 tablet 2   flecainide  (TAMBOCOR ) 50 MG tablet TAKE 1 TABLET BY MOUTH TWICE  DAILY 180 tablet 2   ipratropium (ATROVENT) 0.03 % nasal spray Place 1 spray into both nostrils as needed.     Multiple Vitamins-Minerals (CENTRUM SILVER PO) Take 1 capsule by mouth daily.     Omega-3 Fatty Acids (FISH OIL PO) Take 1,040 mg by mouth daily.     omeprazole (PRILOSEC) 20 MG capsule Take 20 mg by mouth daily.     rivaroxaban  (XARELTO ) 20 MG TABS tablet TAKE 1 TABLET BY MOUTH DAILY  WITH SUPPER 100 tablet 0   tadalafil  (CIALIS )  20 MG tablet Take 1 tablet (20 mg total) by mouth daily as needed for erectile dysfunction. 10 tablet 5   No current facility-administered medications for this encounter.    Physical Findings: The patient is in no acute distress. Patient is alert and oriented.  height is 6' 2 (1.88 m) and weight is 184 lb 8 oz (83.7 kg). His temporal temperature is 97.5 F (36.4 C) (abnormal). His blood pressure is 121/71 and his pulse is 61. His respiration is 18 and oxygen saturation is 98%. .  No significant changes.  Lab Findings: Lab Results  Component Value Date   WBC 8.7 12/15/2023   HGB 15.4 12/15/2023   HCT 45.9 12/15/2023   MCV 93.7 12/15/2023   PLT 236.0 12/15/2023    Radiographic Findings: No results found.  Impression:  The patient tolerated radiation well with some improvement in sx in just 3 weeks post treatment.  Plan:  follow up in 9 weeks to assess response to treatment, can consider additional treatment if results not optimal  Will also reach out to radiology re the optimal imaging to assess what sounds like sx from his trigeminal nerve and communicate this to his PCP  Total time  spent today in preparation for this visit was 40 minutes. This included patient care, documentation, multidisciplinary discussion and coordination of care and follow up.    Estefana HERO. Maritza, M.D.

## 2024-07-29 ENCOUNTER — Other Ambulatory Visit: Payer: Self-pay | Admitting: Family Medicine

## 2024-07-29 DIAGNOSIS — G500 Trigeminal neuralgia: Secondary | ICD-10-CM

## 2024-08-15 ENCOUNTER — Other Ambulatory Visit

## 2024-08-16 ENCOUNTER — Telehealth: Payer: Self-pay | Admitting: *Deleted

## 2024-08-16 NOTE — Telephone Encounter (Signed)
 Copied from CRM [id number]. Topic: Referral - Question >> Aug 16, 2024  1:23 PM Alfonso HERO wrote: Reason for CRM: patient calling to request a referral be sent over to [facility] for right hip pain

## 2024-08-16 NOTE — Telephone Encounter (Signed)
 Spoke with the patient and informed him a visit is needed here prior to placing a referral to a specialist since he has not been seen for this problem.  Patient stated he was already seen by Dr Hiram.  Offered a number of times to schedule an appt and the patient declined.

## 2024-08-18 ENCOUNTER — Ambulatory Visit: Payer: Self-pay | Admitting: Family Medicine

## 2024-08-18 ENCOUNTER — Ambulatory Visit: Admitting: Family Medicine

## 2024-08-18 ENCOUNTER — Encounter: Payer: Self-pay | Admitting: Family Medicine

## 2024-08-18 VITALS — BP 108/60 | HR 65 | Temp 98.2°F | Ht 74.0 in | Wt 182.4 lb

## 2024-08-18 DIAGNOSIS — R5383 Other fatigue: Secondary | ICD-10-CM

## 2024-08-18 DIAGNOSIS — M25551 Pain in right hip: Secondary | ICD-10-CM | POA: Diagnosis not present

## 2024-08-18 LAB — CBC WITH DIFFERENTIAL/PLATELET
Basophils Absolute: 0 K/uL (ref 0.0–0.1)
Basophils Relative: 0.5 % (ref 0.0–3.0)
Eosinophils Absolute: 0.2 K/uL (ref 0.0–0.7)
Eosinophils Relative: 2.8 % (ref 0.0–5.0)
HCT: 46.4 % (ref 39.0–52.0)
Hemoglobin: 15.7 g/dL (ref 13.0–17.0)
Lymphocytes Relative: 28.4 % (ref 12.0–46.0)
Lymphs Abs: 2.1 K/uL (ref 0.7–4.0)
MCHC: 33.9 g/dL (ref 30.0–36.0)
MCV: 91.9 fl (ref 78.0–100.0)
Monocytes Absolute: 0.7 K/uL (ref 0.1–1.0)
Monocytes Relative: 9.3 % (ref 3.0–12.0)
Neutro Abs: 4.3 K/uL (ref 1.4–7.7)
Neutrophils Relative %: 59 % (ref 43.0–77.0)
Platelets: 254 K/uL (ref 150.0–400.0)
RBC: 5.05 Mil/uL (ref 4.22–5.81)
RDW: 13.6 % (ref 11.5–15.5)
WBC: 7.3 K/uL (ref 4.0–10.5)

## 2024-08-18 LAB — TSH: TSH: 1.62 u[IU]/mL (ref 0.35–5.50)

## 2024-08-18 NOTE — Progress Notes (Unsigned)
" ° °  Established Patient Office Visit  Subjective   Patient ID: Garrett Vargas, male    DOB: 01/22/1946  Age: 79 y.o. MRN: [id number]  Chief Complaint  Patient presents with   Hip Pain    Patient complains of sudden onset of right hip pain x2 months, no known injury, denies trial of OTC medication and requests referral to [facility]    HPI   Current Outpatient Medications  Medication Instructions   atenolol  (TENORMIN ) 25 MG tablet TAKE ONE-HALF TABLET BY MOUTH  DAILY . MAY TAKE AN EXTRA  ONE-HALF TABLET FOR PALPITATIONS OR HEART RACING.   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  {Labs (Optional):23779}  The 10-year ASCVD risk score (Arnett DK, et al., 2019) is: 24.7%    Assessment & Plan:   Right hip pain -     Ambulatory referral to Orthopedic Surgery  Other fatigue -     CBC with Differential/Platelet; Future -     TSH; Future     Return in about 4 months (around 12/16/2024) for AWV.    Heron CHRISTELLA Sharper, MD "

## 2024-08-31 ENCOUNTER — Ambulatory Visit
Admission: RE | Admit: 2024-08-31 | Discharge: 2024-08-31 | Disposition: A | Source: Ambulatory Visit | Attending: Family Medicine

## 2024-08-31 DIAGNOSIS — G500 Trigeminal neuralgia: Secondary | ICD-10-CM

## 2024-08-31 MED ORDER — GADOPICLENOL 0.5 MMOL/ML IV SOLN
8.0000 mL | Freq: Once | INTRAVENOUS | Status: AC | PRN
  Administered 2024-08-31: 8 mL via INTRAVENOUS

## 2024-09-06 ENCOUNTER — Ambulatory Visit: Payer: Self-pay | Admitting: Family Medicine

## 2024-09-22 ENCOUNTER — Ambulatory Visit: Admitting: Radiation Oncology
